# Patient Record
Sex: Male | Born: 1945 | Race: White | Hispanic: No | Marital: Married
Health system: Southern US, Community
[De-identification: ages and names within clinical notes are randomized; demographics above are authoritative.]

## PROBLEM LIST (undated history)

## (undated) DIAGNOSIS — R569 Unspecified convulsions: Secondary | ICD-10-CM

## (undated) DIAGNOSIS — I10 Essential (primary) hypertension: Secondary | ICD-10-CM

## (undated) DIAGNOSIS — I82409 Acute embolism and thrombosis of unspecified deep veins of unspecified lower extremity: Secondary | ICD-10-CM

## (undated) DIAGNOSIS — K219 Gastro-esophageal reflux disease without esophagitis: Secondary | ICD-10-CM

## (undated) DIAGNOSIS — F4310 Post-traumatic stress disorder, unspecified: Secondary | ICD-10-CM

## (undated) DIAGNOSIS — E785 Hyperlipidemia, unspecified: Secondary | ICD-10-CM

## (undated) DIAGNOSIS — N289 Disorder of kidney and ureter, unspecified: Secondary | ICD-10-CM

## (undated) HISTORY — PX: JOINT REPLACEMENT: SHX530

---
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
## 2012-05-01 ENCOUNTER — Telehealth: Payer: Self-pay

## 2012-05-01 NOTE — Telephone Encounter (Signed)
LMOM to call.

## 2012-05-10 ENCOUNTER — Other Ambulatory Visit (HOSPITAL_COMMUNITY): Payer: Self-pay | Admitting: Urology

## 2012-05-10 DIAGNOSIS — R3989 Other symptoms and signs involving the genitourinary system: Secondary | ICD-10-CM

## 2012-05-15 NOTE — Telephone Encounter (Signed)
VM from pt. He does not wish to schedule colonoscopy yet. He is waiting on results of biopsy from his prostate and should have that by 11/6. He will call after that.

## 2012-05-18 ENCOUNTER — Ambulatory Visit (HOSPITAL_COMMUNITY)
Admission: RE | Admit: 2012-05-18 | Discharge: 2012-05-18 | Disposition: A | Discharge status: Home / Self Care | Payer: Medicare Other | Source: Ambulatory Visit | Attending: Urology | Admitting: Urology

## 2012-05-18 DIAGNOSIS — R9389 Abnormal findings on diagnostic imaging of other specified body structures: Secondary | ICD-10-CM | POA: Insufficient documentation

## 2012-05-18 DIAGNOSIS — R3989 Other symptoms and signs involving the genitourinary system: Secondary | ICD-10-CM

## 2012-06-06 NOTE — Telephone Encounter (Signed)
Letter reminder to pt and letter to PCP.

## 2013-03-21 ENCOUNTER — Emergency Department (HOSPITAL_COMMUNITY): Payer: Medicare Other

## 2013-03-21 ENCOUNTER — Encounter (HOSPITAL_COMMUNITY): Payer: Self-pay

## 2013-03-21 ENCOUNTER — Inpatient Hospital Stay (HOSPITAL_COMMUNITY)
Admission: EM | Admit: 2013-03-21 | Discharge: 2013-03-24 | DRG: 872 | Disposition: A | Discharge status: Home / Self Care | Payer: Medicare Other | Attending: Internal Medicine | Admitting: Internal Medicine

## 2013-03-21 DIAGNOSIS — D696 Thrombocytopenia, unspecified: Secondary | ICD-10-CM

## 2013-03-21 DIAGNOSIS — A938 Other specified arthropod-borne viral fevers: Secondary | ICD-10-CM

## 2013-03-21 DIAGNOSIS — E785 Hyperlipidemia, unspecified: Secondary | ICD-10-CM | POA: Diagnosis present

## 2013-03-21 DIAGNOSIS — R630 Anorexia: Secondary | ICD-10-CM | POA: Diagnosis present

## 2013-03-21 DIAGNOSIS — R000 Tachycardia, unspecified: Secondary | ICD-10-CM | POA: Diagnosis present

## 2013-03-21 DIAGNOSIS — E872 Acidosis, unspecified: Secondary | ICD-10-CM

## 2013-03-21 DIAGNOSIS — E871 Hypo-osmolality and hyponatremia: Secondary | ICD-10-CM

## 2013-03-21 DIAGNOSIS — A770 Spotted fever due to Rickettsia rickettsii: Secondary | ICD-10-CM

## 2013-03-21 DIAGNOSIS — A419 Sepsis, unspecified organism: Principal | ICD-10-CM

## 2013-03-21 DIAGNOSIS — F17200 Nicotine dependence, unspecified, uncomplicated: Secondary | ICD-10-CM | POA: Diagnosis present

## 2013-03-21 DIAGNOSIS — R569 Unspecified convulsions: Secondary | ICD-10-CM | POA: Diagnosis present

## 2013-03-21 DIAGNOSIS — D6959 Other secondary thrombocytopenia: Secondary | ICD-10-CM | POA: Diagnosis present

## 2013-03-21 DIAGNOSIS — E876 Hypokalemia: Secondary | ICD-10-CM | POA: Diagnosis not present

## 2013-03-21 DIAGNOSIS — N390 Urinary tract infection, site not specified: Secondary | ICD-10-CM

## 2013-03-21 DIAGNOSIS — K219 Gastro-esophageal reflux disease without esophagitis: Secondary | ICD-10-CM | POA: Diagnosis present

## 2013-03-21 DIAGNOSIS — G40909 Epilepsy, unspecified, not intractable, without status epilepticus: Secondary | ICD-10-CM | POA: Diagnosis present

## 2013-03-21 DIAGNOSIS — F4310 Post-traumatic stress disorder, unspecified: Secondary | ICD-10-CM | POA: Diagnosis present

## 2013-03-21 DIAGNOSIS — I10 Essential (primary) hypertension: Secondary | ICD-10-CM | POA: Diagnosis present

## 2013-03-21 HISTORY — DX: Post-traumatic stress disorder, unspecified: F43.10

## 2013-03-21 HISTORY — DX: Unspecified convulsions: R56.9

## 2013-03-21 HISTORY — DX: Essential (primary) hypertension: I10

## 2013-03-21 HISTORY — DX: Gastro-esophageal reflux disease without esophagitis: K21.9

## 2013-03-21 HISTORY — DX: Hyperlipidemia, unspecified: E78.5

## 2013-03-21 LAB — CBC WITH DIFFERENTIAL/PLATELET
Basophils Absolute: 0 10*3/uL (ref 0.0–0.1)
Basophils Relative: 0 % (ref 0–1)
Eosinophils Relative: 0 % (ref 0–5)
HCT: 39.2 % (ref 39.0–52.0)
Hemoglobin: 14.3 g/dL (ref 13.0–17.0)
Lymphocytes Relative: 15 % (ref 12–46)
Lymphs Abs: 0.6 10*3/uL — ABNORMAL LOW (ref 0.7–4.0)
MCV: 87.7 fL (ref 78.0–100.0)
Monocytes Relative: 5 % (ref 3–12)
Neutro Abs: 3.1 10*3/uL (ref 1.7–7.7)
RBC: 4.47 MIL/uL (ref 4.22–5.81)
WBC: 3.9 10*3/uL — ABNORMAL LOW (ref 4.0–10.5)

## 2013-03-21 LAB — LACTIC ACID, PLASMA
Lactic Acid, Venous: 3.2 mmol/L — ABNORMAL HIGH (ref 0.5–2.2)
Lactic Acid, Venous: 1.7 mmol/L (ref 0.5–2.2)

## 2013-03-21 LAB — URINALYSIS, ROUTINE W REFLEX MICROSCOPIC
Glucose, UA: NEGATIVE mg/dL
Leukocytes, UA: NEGATIVE
Specific Gravity, Urine: 1.02 (ref 1.005–1.030)
Urobilinogen, UA: 1 mg/dL (ref 0.0–1.0)

## 2013-03-21 LAB — APTT: aPTT: 33 seconds (ref 24–37)

## 2013-03-21 LAB — COMPREHENSIVE METABOLIC PANEL
ALT: 51 U/L (ref 0–53)
AST: 99 U/L — ABNORMAL HIGH (ref 0–37)
Alkaline Phosphatase: 79 U/L (ref 39–117)
CO2: 25 mEq/L (ref 19–32)
Calcium: 8.5 mg/dL (ref 8.4–10.5)
Glucose, Bld: 201 mg/dL — ABNORMAL HIGH (ref 70–99)
Potassium: 3.5 mEq/L (ref 3.5–5.1)
Sodium: 127 mEq/L — ABNORMAL LOW (ref 135–145)
Total Protein: 7.7 g/dL (ref 6.0–8.3)

## 2013-03-21 LAB — URINE MICROSCOPIC-ADD ON

## 2013-03-21 MED ORDER — TAMSULOSIN HCL 0.4 MG PO CAPS
0.4000 mg | ORAL_CAPSULE | Freq: Every day | ORAL | Status: DC
  Administered 2013-03-21 – 2013-03-23 (×3): 0.4 mg via ORAL
  Filled 2013-03-21 (×3): qty 1

## 2013-03-21 MED ORDER — DIAZEPAM 5 MG PO TABS: 5.0000 mg | ORAL_TABLET | Freq: Four times a day (QID) | ORAL | Status: DC | PRN

## 2013-03-21 MED ORDER — ONDANSETRON HCL 4 MG/2ML IJ SOLN: 4.0000 mg | Freq: Four times a day (QID) | INTRAMUSCULAR | Status: DC | PRN

## 2013-03-21 MED ORDER — SODIUM CHLORIDE 0.9 % IJ SOLN
3.0000 mL | Freq: Two times a day (BID) | INTRAMUSCULAR | Status: DC
  Administered 2013-03-22 – 2013-03-23 (×2): 3 mL via INTRAVENOUS

## 2013-03-21 MED ORDER — ZOLPIDEM TARTRATE 5 MG PO TABS
5.0000 mg | ORAL_TABLET | Freq: Every day | ORAL | Status: DC
  Administered 2013-03-21 – 2013-03-23 (×3): 5 mg via ORAL
  Filled 2013-03-21 (×3): qty 1

## 2013-03-21 MED ORDER — MELOXICAM 7.5 MG PO TABS
15.0000 mg | ORAL_TABLET | Freq: Every day | ORAL | Status: DC
  Administered 2013-03-22 – 2013-03-23 (×2): 15 mg via ORAL
  Filled 2013-03-21: qty 1
  Filled 2013-03-21: qty 2
  Filled 2013-03-21 (×2): qty 1
  Filled 2013-03-21 (×2): qty 2

## 2013-03-21 MED ORDER — SODIUM CHLORIDE 0.9 % IV BOLUS (SEPSIS)
2000.0000 mL | Freq: Once | INTRAVENOUS | Status: AC
  Administered 2013-03-21: 2000 mL via INTRAVENOUS

## 2013-03-21 MED ORDER — SENNA 8.6 MG PO TABS
1.0000 | ORAL_TABLET | Freq: Two times a day (BID) | ORAL | Status: DC
  Administered 2013-03-21 – 2013-03-23 (×5): 8.6 mg via ORAL
  Filled 2013-03-21 (×5): qty 1

## 2013-03-21 MED ORDER — LORATADINE 10 MG PO TABS
10.0000 mg | ORAL_TABLET | Freq: Every day | ORAL | Status: DC
  Administered 2013-03-21 – 2013-03-23 (×3): 10 mg via ORAL
  Filled 2013-03-21 (×3): qty 1

## 2013-03-21 MED ORDER — PHENYTOIN SODIUM EXTENDED 100 MG PO CAPS
100.0000 mg | ORAL_CAPSULE | Freq: Three times a day (TID) | ORAL | Status: DC
  Administered 2013-03-21 – 2013-03-22 (×4): 100 mg via ORAL
  Filled 2013-03-21 (×4): qty 1

## 2013-03-21 MED ORDER — ACETAMINOPHEN 325 MG PO TABS
650.0000 mg | ORAL_TABLET | Freq: Once | ORAL | Status: AC
  Administered 2013-03-21: 650 mg via ORAL
  Filled 2013-03-21: qty 2

## 2013-03-21 MED ORDER — DOXYCYCLINE HYCLATE 100 MG IV SOLR
100.0000 mg | Freq: Two times a day (BID) | INTRAVENOUS | Status: DC
  Administered 2013-03-21 – 2013-03-23 (×4): 100 mg via INTRAVENOUS
  Filled 2013-03-21 (×5): qty 100

## 2013-03-21 MED ORDER — ACETAMINOPHEN 650 MG RE SUPP: 650.0000 mg | Freq: Four times a day (QID) | RECTAL | Status: DC | PRN

## 2013-03-21 MED ORDER — FLEET ENEMA 7-19 GM/118ML RE ENEM: 1.0000 | ENEMA | Freq: Once | RECTAL | Status: AC | PRN

## 2013-03-21 MED ORDER — MELOXICAM 7.5 MG PO TABS
ORAL_TABLET | ORAL | Status: AC
  Filled 2013-03-21: qty 1

## 2013-03-21 MED ORDER — HYDROMORPHONE HCL PF 1 MG/ML IJ SOLN: 0.5000 mg | INTRAMUSCULAR | Status: DC | PRN

## 2013-03-21 MED ORDER — SIMVASTATIN 20 MG PO TABS
40.0000 mg | ORAL_TABLET | Freq: Every evening | ORAL | Status: DC
  Administered 2013-03-21 – 2013-03-23 (×3): 40 mg via ORAL
  Filled 2013-03-21 (×3): qty 2

## 2013-03-21 MED ORDER — SODIUM CHLORIDE 0.9 % IV SOLN
INTRAVENOUS | Status: DC
  Administered 2013-03-21 – 2013-03-22 (×2): via INTRAVENOUS
  Administered 2013-03-23: 10 mL via INTRAVENOUS

## 2013-03-21 MED ORDER — DEXTROSE 5 % IV SOLN
1.0000 g | INTRAVENOUS | Status: DC
  Administered 2013-03-21 – 2013-03-23 (×3): 1 g via INTRAVENOUS
  Filled 2013-03-21 (×5): qty 10

## 2013-03-21 MED ORDER — ALUM & MAG HYDROXIDE-SIMETH 200-200-20 MG/5ML PO SUSP: 30.0000 mL | Freq: Four times a day (QID) | ORAL | Status: DC | PRN

## 2013-03-21 MED ORDER — HYDROCODONE-ACETAMINOPHEN 5-325 MG PO TABS
1.0000 | ORAL_TABLET | ORAL | Status: DC | PRN
  Administered 2013-03-22: 2 via ORAL
  Filled 2013-03-21: qty 2

## 2013-03-21 MED ORDER — ARIPIPRAZOLE 10 MG PO TABS
20.0000 mg | ORAL_TABLET | Freq: Every day | ORAL | Status: DC
  Administered 2013-03-21 – 2013-03-23 (×3): 20 mg via ORAL
  Filled 2013-03-21 (×3): qty 2

## 2013-03-21 MED ORDER — PANTOPRAZOLE SODIUM 40 MG PO TBEC
40.0000 mg | DELAYED_RELEASE_TABLET | Freq: Every day | ORAL | Status: DC
  Administered 2013-03-21 – 2013-03-23 (×3): 40 mg via ORAL
  Filled 2013-03-21 (×3): qty 1

## 2013-03-21 MED ORDER — VENLAFAXINE HCL ER 75 MG PO CP24
150.0000 mg | ORAL_CAPSULE | Freq: Every day | ORAL | Status: DC
  Administered 2013-03-21 – 2013-03-23 (×3): 150 mg via ORAL
  Filled 2013-03-21 (×3): qty 2

## 2013-03-21 MED ORDER — ONDANSETRON HCL 4 MG PO TABS: 4.0000 mg | ORAL_TABLET | Freq: Four times a day (QID) | ORAL | Status: DC | PRN

## 2013-03-21 MED ORDER — ACETAMINOPHEN 325 MG PO TABS
650.0000 mg | ORAL_TABLET | Freq: Four times a day (QID) | ORAL | Status: DC | PRN
  Administered 2013-03-21 – 2013-03-23 (×4): 650 mg via ORAL
  Filled 2013-03-21 (×4): qty 2

## 2013-03-21 NOTE — ED Notes (Signed)
Pt c/o NVD, generalized weakness, lightheadedness, and dizziness since Sunday. Pt states he fell twice today due to weakness. Pt hit his forehead on countertop when falling this morning. Pt denies LOC. Pt also denies headache, chest pain, abdominal pain. Pt states he has not eaten since Sunday due to symptoms.

## 2013-03-21 NOTE — ED Provider Notes (Signed)
CSN: [id number]     Arrival date & time 03/21/13  1251 History  This chart was scribed for Nathan B. Bernette Mayers, MD by Blanchard Kelch, ED Scribe. The patient was seen in room APA09/APA09. Patient's care was started at 1:27 PM.   Chief Complaint  Patient presents with  . Emesis  . Diarrhea  . Fever    The history is provided by the patient. No language interpreter was used.    HPI Comments: Nathan [name] is a 67 y.o. male who presents to the Emergency Department complaining of ongoing, constant, gradually worsening fever (max 103, currently) with associated loss of appetite, chills, emesis, diarrhea, urinary frequency and weakness since three days ago. The emesis stopped yesterday. Patient went to PCP who told him to come to the emergency department. Patient reported falling down twice today and hit his head but denies syncope  Patient has dogs and has been out in the yard recently, but denies any tick bites. He also traveled to [place] two months ago and returned the 14th of June. Patient reports he was in a Malaria free zone and not near the area with a current Ebola outbreak. Pt denies any sick contacts. Patient has bilateral leg sores that he scratches that have been present for about a year. Patient denies coughing, sore throat, runny nose, hematochezia, dysuria, abdominal pain, or rashes.   Past Medical History  Diagnosis Date  . PTSD (post-traumatic stress disorder)   . Seizure    History reviewed. No pertinent past surgical history. No family history on file. History  Substance Use Topics  . Smoking status: Current Some Day Smoker    Types: Pipe  . Smokeless tobacco: Not on file  . Alcohol Use: No    Review of Systems: A complete 10 system review of systems was obtained and all systems are negative except as noted in the HPI and PMH.    Allergies  Review of patient's allergies indicates no known allergies.  Home Medications  No current outpatient prescriptions on  file.  Triage Vitals: BP 113/68  Pulse 118  Temp(Src) 103 F (39.4 C) (Oral)  Resp 20  Ht 6' (1.829 m)  Wt 202 lb (91.627 kg)  BMI 27.39 kg/m2  SpO2 99%  Physical Exam  Nursing note and vitals reviewed. Constitutional: He is oriented to person, place, and time. He appears well-developed and well-nourished.  HENT:  Head: Normocephalic and atraumatic.  Dry mouth.   Eyes: EOM are normal. Pupils are equal, round, and reactive to light.  Neck: Normal range of motion. Neck supple.  Cardiovascular: Normal rate, normal heart sounds and intact distal pulses.   Pulmonary/Chest: Effort normal and breath sounds normal.  Abdominal: Bowel sounds are normal. He exhibits no distension. There is no tenderness.  Musculoskeletal: Normal range of motion. He exhibits no edema and no tenderness.  Neurological: He is alert and oriented to person, place, and time. He has normal strength. No cranial nerve deficit or sensory deficit.  Skin: Skin is warm and dry. No rash noted.  Several excoriated areas on bilateral lower extremities with no signs or secondary infection. No ticks found on skin exam.  Psychiatric: He has a normal mood and affect.    ED Course  Procedures (including critical care time)  DIAGNOSTIC STUDIES: Oxygen Saturation is 99% on room air, normal by my interpretation.    COORDINATION OF CARE:  1:32 PM - Patient verbalizes understanding and agrees with treatment plan.    Labs Review Labs Reviewed  CBC WITH DIFFERENTIAL - Abnormal; Notable for the following:    WBC 3.9 (*)    MCHC 36.5 (*)    Platelets 100 (*)    Neutrophils Relative % 80 (*)    Lymphs Abs 0.6 (*)    All other components within normal limits  URINALYSIS, ROUTINE W REFLEX MICROSCOPIC - Abnormal; Notable for the following:    Hgb urine dipstick TRACE (*)    Ketones, ur TRACE (*)    Protein, ur TRACE (*)    All other components within normal limits  COMPREHENSIVE METABOLIC PANEL - Abnormal; Notable for the  following:    Sodium 127 (*)    Chloride 89 (*)    Glucose, Bld 201 (*)    AST 99 (*)    GFR calc non Af Amer 55 (*)    GFR calc Af Amer 64 (*)    All other components within normal limits  PROTIME-INR - Abnormal; Notable for the following:    Prothrombin Time 15.5 (*)    All other components within normal limits  LACTIC ACID, PLASMA - Abnormal; Notable for the following:    Lactic Acid, Venous 3.2 (*)    All other components within normal limits  GLUCOSE, CAPILLARY - Abnormal; Notable for the following:    Glucose-Capillary 155 (*)    All other components within normal limits  URINE MICROSCOPIC-ADD ON - Abnormal; Notable for the following:    Bacteria, UA MANY (*)    Casts GRANULAR CAST (*)    All other components within normal limits  CULTURE, BLOOD (ROUTINE X 2)  CULTURE, BLOOD (ROUTINE X 2)  URINE CULTURE  APTT  ROCKY MTN SPOTTED FVR AB, IGG-BLOOD  ROCKY MTN SPOTTED FVR AB, IGM-BLOOD  HIV ANTIBODY (ROUTINE TESTING)  EHRLICHIA ANTIBODY PANEL  LACTIC ACID, PLASMA  BASIC METABOLIC PANEL  APTT  PROTIME-INR  CBC    Imaging Review  Dg Chest 2 View  03/21/2013   *RADIOLOGY REPORT*  Clinical Data: Fever, lightheaded.  CHEST - 2 VIEW  Comparison: None.  Findings: Trachea is midline.  Heart size normal.  Lungs are hyperinflated but clear.  No pleural fluid.  Mild pectus deformity.  Degenerative changes in the spine.  Mild anterior wedging of a lower thoracic vertebral body, age indeterminate.  IMPRESSION: No acute findings.   Original Report Authenticated By: Leanna Battles, M.D.   Ct Head Wo Contrast  03/21/2013   CLINICAL DATA:  Head injury following a fall.  EXAM: CT HEAD WITHOUT CONTRAST  TECHNIQUE: Contiguous axial images were obtained from the base of the skull through the vertex without intravenous contrast.  COMPARISON:  None.  FINDINGS: Mildly enlarged ventricles and subarachnoid spaces. Small amount of patchy white matter low density in both cerebral hemispheres and  small area of more focal white matter low density in the right frontal lobe adjacent to the frontal horn of the right lateral ventricle. No skull fracture, intracranial hemorrhage or paranasal sinus air-fluid levels.  IMPRESSION: 1. No skull fracture or intracranial hemorrhage. 2. Mild atrophy. 3. Minimal small vessel white matter ischemic changes.   Electronically Signed   By: Gordan Payment   On: 03/21/2013 14:27    MDM   1. 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 Seizure   3. PTSD (post-traumatic stress disorder)   4. HTN (hypertension)   5. GERD (gastroesophageal reflux disease)   6. Acidosis   7. Hyponatremia   8. Sepsis   9. Thrombocytopenia   10. UTI (lower urinary tract  infection)   11. Tick-borne fever       Date: 03/21/2013  Rate: 100  Rhythm: sinus tachycardia  QRS Axis: normal  Intervals: normal  ST/T Wave abnormalities: normal  Conduction Disutrbances:none  Narrative Interpretation:   Old EKG Reviewed: none available  Pt with fever and headache for 3 days, also has hyponatremia, thrombocytopenia. Concern for occult RMSF. Will begin doxycycline and admit for further evaluation, hydration and titers.   I personally performed the services described in this documentation, which was scribed in my presence. The recorded information has been reviewed and is accurate.     Nathan B. Bernette Mayers, MD 03/21/13 2016

## 2013-03-21 NOTE — H&P (Signed)
[facility] History and Physical  ALOYSUIS RIBAUDO EAV:[id number] DOB: [birth year] DOA: 03/21/2013  Referring physician:  PCP: Provider Not In System  Specialists:   Chief Complaint: persistent fever, weakness anorexia, nausea  HPI: Nathan [name] is a 67 y.o. male with past medical history that includes hypertension, hyperlipidemia, seizure disorder, posttraumatic stress disorder presents emergency room with chief complaint of persistent fever, weakness, anorexia nausea and vomiting. Information is obtained from the patient. He indicates that 4 days ago he developed mild weakness with intermittent chills and fever. He also indicates he was extremely fatigued and slept most of the previous 4 days. Yesterday he developed nausea with vomiting. He describes the emesis as yellowish bile. He denies any coffee ground emesis. He also reports that yesterday he fell without losing consciousness. He explains that his legs just gave out. He indicates that he did hit his head but did not lose consciousness. Associated symptoms include fever with a max of 103, anorexia, intermittent incontinence of urine and bowel. Patient denies abdominal pain dysuria hematuria diarrhea constipation or melena. He denies headache or visual disturbances. He denies chest pain palpitations or shortness of breath. He does indicate that [place] and spends a lot of time in the woods. He has several excoriated areas on his lower extremities bilaterally that he explains as previous tick bites that he continues to scratch. There is also several areas that appear to be similar in nature but completely healed. Lab work in the emergency room is significant for white count of 3.9 platelets of 100 sodium of 127 chloride of 89 and lactic acid of 3.2 and AST of 99, PT of 15.5. His vital signs are significant for an oral temperature of 103 a heart rate of 118 blood pressure 113/68. Urinalysis with  many bacteria 3-6 WBCs. CT of the head with no acute findings. Chest x-ray unremarkable. EKG with normal sinus rhythm and PACs. Symptoms came on suddenly have persisted and worsened characterized as moderate to severe.  Review of Systems:  14 point review of systems complete and negative except as indicated in history of present illness  Past Medical History  Diagnosis Date  . PTSD (post-traumatic stress disorder)   . Seizure   . Hyperlipidemia   . HTN (hypertension)   . GERD (gastroesophageal reflux disease)    History reviewed. No pertinent past surgical history. Social History:  reports that he has been smoking Pipe.  He does not have any smokeless tobacco history on file. He reports that he does not drink alcohol or use illicit drugs. 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 He is independent with his ADLs. He is a veteran of the [employer] his primary care provider is in the [place] No family history on file. family history reviewed in his not pertinent to the admission of this elderly gentleman.  Prior to Admission medications   Medication Sig Start Date End Date Taking? Authorizing Provider  ARIPiprazole (ABILIFY) 20 MG tablet Take 20 mg by mouth daily.   Yes Historical Provider, MD  cetirizine (ZYRTEC) 10 MG tablet Take 10 mg by mouth daily.   Yes Historical Provider, MD  diazepam (VALIUM) 5 MG tablet Take 5 mg by mouth every 6 (six) hours as needed for anxiety.   Yes Historical Provider, MD  lisinopril (PRINIVIL,ZESTRIL) 40 MG tablet Take 40 mg by mouth daily.   Yes  Historical Provider, MD  meloxicam (MOBIC) 15 MG tablet Take 15 mg by mouth daily.   Yes Historical Provider, MD  metoprolol tartrate (LOPRESSOR) 25 MG tablet Take 25 mg by mouth 2 (two) times daily.   Yes Historical Provider, MD  omeprazole (PRILOSEC) 20 MG capsule Take 20 mg by mouth daily.   Yes Historical Provider, MD  phenytoin  (DILANTIN) 100 MG ER capsule Take by mouth 3 (three) times daily.   Yes Historical Provider, MD  sildenafil (VIAGRA) 50 MG tablet Take 50 mg by mouth daily as needed for erectile dysfunction.   Yes Historical Provider, MD  simvastatin (ZOCOR) 40 MG tablet Take 40 mg by mouth every evening.   Yes Historical Provider, MD  tamsulosin (FLOMAX) 0.4 MG CAPS capsule Take 0.4 mg by mouth daily.   Yes Historical Provider, MD  venlafaxine XR (EFFEXOR-XR) 150 MG 24 hr capsule Take 150 mg by mouth daily.   Yes Historical Provider, MD  zolpidem (AMBIEN) 10 MG tablet Take 10 mg by mouth at bedtime.   Yes Historical Provider, MD   Physical Exam: Filed Vitals:   03/21/13 1424  BP: 104/65  Pulse: 101  Temp:   Resp:      General:  Well-nourished no acute distress  Eyes: PE RRL, EOMI, no scleral icterus  ENT: Ears clear nose without drainage oropharynx without erythema or exudate mucous membranes of his mouth are pink slightly dry  Neck: Supple full range of motion nontender to palpation no lymphadenopathy no meningeal signs appreciated  Cardiovascular: Tachycardic but regular no murmur no gallop or no rub no lower extremity the  Respiratory: Normal effort breath sounds somewhat distant slightly coarse. No wheeze no rhonchi  Abdomen: Nondistended soft positive bowel sounds nontender to  Skin: Warm and dry lower extremities with several excoriated areas bilaterally. Also several similar areas that have completely healed. None with any erythema swelling drainage or indication of infection.  Musculoskeletal: No clubbing no cyanosis moves all extremities  Psychiatric: Cooperative calm  Neurologic: Cranial nerves II through XII grossly intact speech clear facial symmetry  Labs on Admission:  Basic Metabolic Panel:  Recent Labs Lab 03/21/13 1330  NA 127*  K 3.5  CL 89*  CO2 25  GLUCOSE 201*  BUN 20  CREATININE 1.30  CALCIUM 8.5   Liver Function Tests:  Recent Labs Lab 03/21/13 1330   AST 99*  ALT 51  ALKPHOS 79  BILITOT 0.5  PROT 7.7  ALBUMIN 3.7   No results found for this basename: LIPASE, AMYLASE,  in the last 168 hours No results found for this basename: AMMONIA,  in the last 168 hours CBC:  Recent Labs Lab 03/21/13 1330  WBC 3.9*  NEUTROABS 3.1  HGB 14.3  HCT 39.2  MCV 87.7  PLT 100*   Cardiac Enzymes: No results found for this basename: CKTOTAL, CKMB, CKMBINDEX, TROPONINI,  in the last 168 hours  BNP (last 3 results) No results found for this basename: PROBNP,  in the last 8760 hours CBG:  Recent Labs Lab 03/21/13 1349  GLUCAP 155*    Radiological Exams on Admission: Dg Chest 2 View  03/21/2013   *RADIOLOGY REPORT*  Clinical Data: Fever, lightheaded.  CHEST - 2 VIEW  Comparison: None.  Findings: Trachea is midline.  Heart size normal.  Lungs are hyperinflated but clear.  No pleural fluid.  Mild pectus deformity.  Degenerative changes in the spine.  Mild anterior wedging of a lower thoracic vertebral body, age indeterminate.  IMPRESSION: No acute  findings.   Original Report Authenticated By: Leanna Battles, M.D.   Ct Head Wo Contrast  03/21/2013   CLINICAL DATA:  Head injury following a fall.  EXAM: CT HEAD WITHOUT CONTRAST  TECHNIQUE: Contiguous axial images were obtained from the base of the skull through the vertex without intravenous contrast.  COMPARISON:  None.  FINDINGS: Mildly enlarged ventricles and subarachnoid spaces. Small amount of patchy white matter low density in both cerebral hemispheres and small area of more focal white matter low density in the right frontal lobe adjacent to the frontal horn of the right lateral ventricle. No skull fracture, intracranial hemorrhage or paranasal sinus air-fluid levels.  IMPRESSION: 1. No skull fracture or intracranial hemorrhage. 2. Mild atrophy. 3. Minimal small vessel white matter ischemic changes.   Electronically Signed   By: Gordan Payment   On: 03/21/2013 14:27    EKG: Independently reviewed.  Sinus rhythm with PACs  Assessment/Plan  Sepsis: May have [facility] spotted fever from tick bite vs other tick borne illness. Will admit to telemetry. Blood cultures are pending. Will provide IV fluids and will continue doxycycline that was started in the emergency department. Patient is currently hemodynamically stable. Will hold his antihypertensives for now given that his blood pressure range is 104-110. Monitor closely  Thrombocytopenia: Likely related to presumed [facility] spotted fever. Check RMSF labs also check ehrlichiosis. See treatment for #1. No obvious signs of bleeding. Will use SCDs for DVT prophylaxis. Repeat PT in a.m.  Lactic Acidosis: Due to #1. Continue vigorous IV fluids. Will recheck in the a.m.  Tachycardia: Mild. Likely related to #1. Patient is on a beta blocker and an ACE inhibitor for a history of hypertension that is on hold right now. SPECT was will resolve with adequate IV hydration. Will monitor  Hyponatremia: I clearly related to nausea vomiting decreased by mouth intake in the setting of [facility] spotted fever. Provide IV fluids. Will recheck in the a.m.  Seizure: Patient with history of same. States he's not had a seizure and 9 years. Will continue his home medication.  HTN (hypertension): Slightly soft on admission. Will hold his lisinopril and his beta blocker. Will monitor  GERD (gastroesophageal reflux disease): Continue home  UTI: urinalysis and clinical hx. Consistent. Ceftriaxone.     Code Status: full Family Communication: None present Disposition Plan: home when medically ready  Time spent: 70 minutes  Gwenyth Bender [facility] Pager [phone]  If 7PM-7AM, please contact night-coverage [email or web address] Password [facility] 03/21/2013, 4:33 PM  I have seen and examined this patient and I agree with the above assessment and plan.  Patient with febrile illness, possible UTI, multiple tick bites. Has traveled to [place] for 2 weeks  and came back more than 2 months ago in mid June. No sick contacts since.   Pamella Pert, MD [facility] [phone]

## 2013-03-21 NOTE — ED Notes (Signed)
Pt reports not eating since Sunday, uncontrollable urination and diarrhea.  Also reports vomiting.  Pt febrile in triage.  Denies any pain but generalized weakness.  Says fell twice before coming to hospital today.

## 2013-03-22 DIAGNOSIS — R000 Tachycardia, unspecified: Secondary | ICD-10-CM

## 2013-03-22 DIAGNOSIS — N390 Urinary tract infection, site not specified: Secondary | ICD-10-CM

## 2013-03-22 DIAGNOSIS — D696 Thrombocytopenia, unspecified: Secondary | ICD-10-CM

## 2013-03-22 DIAGNOSIS — A419 Sepsis, unspecified organism: Secondary | ICD-10-CM

## 2013-03-22 DIAGNOSIS — E876 Hypokalemia: Secondary | ICD-10-CM | POA: Diagnosis not present

## 2013-03-22 LAB — CBC
HCT: 32.5 % — ABNORMAL LOW (ref 39.0–52.0)
Platelets: 74 10*3/uL — ABNORMAL LOW (ref 150–400)
RDW: 12.4 % (ref 11.5–15.5)
WBC: 3 10*3/uL — ABNORMAL LOW (ref 4.0–10.5)

## 2013-03-22 LAB — ROCKY MTN SPOTTED FVR AB, IGG-BLOOD: RMSF IgG: 0.38 IV

## 2013-03-22 LAB — URINE CULTURE

## 2013-03-22 LAB — BASIC METABOLIC PANEL
BUN: 11 mg/dL (ref 6–23)
CO2: 21 mEq/L (ref 19–32)
Chloride: 102 mEq/L (ref 96–112)
GFR calc Af Amer: >90 mL/min (ref >90)
Potassium: 3.4 mEq/L — ABNORMAL LOW (ref 3.5–5.1)

## 2013-03-22 LAB — PROTIME-INR: INR: 1.47 (ref 0.00–1.49)

## 2013-03-22 LAB — ROCKY MTN SPOTTED FVR AB, IGM-BLOOD: RMSF IgM: 0.17 IV (ref 0.00–0.89)

## 2013-03-22 LAB — HIV ANTIBODY (ROUTINE TESTING W REFLEX): HIV: NONREACTIVE

## 2013-03-22 MED ORDER — POTASSIUM CHLORIDE CRYS ER 20 MEQ PO TBCR
40.0000 meq | EXTENDED_RELEASE_TABLET | Freq: Once | ORAL | Status: AC
  Administered 2013-03-22: 40 meq via ORAL
  Filled 2013-03-22: qty 2

## 2013-03-22 MED ORDER — METOPROLOL TARTRATE 25 MG PO TABS
25.0000 mg | ORAL_TABLET | Freq: Two times a day (BID) | ORAL | Status: DC
  Administered 2013-03-22 – 2013-03-23 (×4): 25 mg via ORAL
  Filled 2013-03-22 (×4): qty 1

## 2013-03-22 NOTE — Care Management Note (Signed)
    Page 1 of 1   03/23/2013     2:40:58 PM   CARE MANAGEMENT NOTE 03/23/2013  Patient:  Nathan [name], Nathan [name]   Account Number:  [id number]  Date Initiated:  03/22/2013  Documentation initiated by:  Sharrie Rothman  Subjective/Objective Assessment:   Pt admitted from home with sepsis and UTI. Pt lives with his wife and will return home at discharge. Pt is independent with ADL's. Pt is [facility] pt.     Action/Plan:   [facility] called and message left for Penelope Galas, transfer coordinator about pt inpatient. Awaiting return call. Pt would prefer to stay here at this time.   Anticipated DC Date:  03/26/2013   Anticipated DC Plan:  HOME/SELF CARE      DC Planning Services  CM consult      Choice offered to / List presented to:             Status of service:  Completed, signed off Medicare Important Message given?  YES (If response is "NO", the following Medicare IM given date fields will be blank) Date Medicare IM given:  03/30/2013 Date Additional Medicare IM given:    Discharge Disposition:  HOME/SELF CARE  Per UR Regulation:    If discussed at Long Length of Stay Meetings, dates discussed:    Comments:  03/23/13 1300 Arlyss Queen, RN BSN CM Another message left for Penelope Galas, transfer coordinator of [place]. Will await return call.  03/22/13 1315 Arlyss Queen, RN BSN CM

## 2013-03-22 NOTE — Progress Notes (Signed)
[facility] PROGRESS NOTE  Nathan [name] Pen MVH:[id number] DOB: 1945/08/23 DOA: 03/21/2013 PCP: Provider Not In System  Assessment/Plan:  Sepsis: May have [place] spotted fever from tick bite vs other tick borne illness. Blood cultures are pending. Max temp 102.2 this am. Blood cultures repeated. Continue IV fluids and will continue doxycycline day #2. Patient remains hemodynamically stable.   Thrombocytopenia: Likely related to presumed [facility] spotted fever. RMSF labs and ehrlichiosis pending. See treatment for #1. No obvious signs of bleeding. Will use SCDs for DVT prophylaxis. PT trending up this a.m.   Lactic Acidosis: Due to #1. Improved. Repeat lactic acid 1.7. Continue IV support   Tachycardia: Resolved. Likely related to #1. HR range 95-97. Will resume home BB with parameters.   Hyponatremia: Likely related to nausea vomiting decreased by mouth intake in the setting of [facility] spotted fever. Improving this am. continue IV fluids at slightly lower rate. Recheck in am.  Seizure: Patient with history of same. States he's not had a seizure and 9 years. Will continue his home medication.  HTN (hypertension): SBP range 115-158. DBP range 65-89. Will resume home BB with parameters. Continue to hold his lisinopril. Will monitor   GERD (gastroesophageal reflux disease): Stable Continue home   Hypokalemia: mild. Will replete and recheck.   UTI: urinalysis and clinical hx. Consistent. Ceftriaxone day #2. Cultures penidng   Code Status: full Family Communication: none present Disposition Plan: home when medically ready   Consultants:  none  Procedures:  none  Antibiotics: Doxycycline 03/21/18 >>>> Rocephin 03/21/13>>>  HPI/Subjective: Awake alert reports feeling "a little better" no complaints  Objective: Filed Vitals:   03/22/13 0951  BP: 131/77  Pulse: 73  Temp:   Resp:     Intake/Output Summary (Last 24 hours) at 03/22/13 0957 Last data filed at  03/22/13 0930  Gross per 24 hour  Intake 2013.33 ml  Output    725 ml  Net 1288.33 ml   Filed Weights   03/21/13 1301 03/21/13 1938  Weight: 91.627 kg (202 lb) 92.3 kg (203 lb 7.8 oz)   Exam:   General:  Well nourished NAD  Cardiovascular: RRR no gallup or rub NO LE edema  Respiratory: normal effort BS clear bilaterally to ausculation no wheeze   Abdomen: soft +BS non-tender to palpation  Musculoskeletal: no clubbing no cyanosis   Data Reviewed: Basic Metabolic Panel:  Recent Labs Lab 03/21/13 1330 03/22/13 0553  NA 127* 134*  K 3.5 3.4*  CL 89* 102  CO2 25 21  GLUCOSE 201* 101*  BUN 20 11  CREATININE 1.30 0.87  CALCIUM 8.5 7.9*   Liver Function Tests:  Recent Labs Lab 03/21/13 1330  AST 99*  ALT 51  ALKPHOS 79  BILITOT 0.5  PROT 7.7  ALBUMIN 3.7   CBC:  Recent Labs Lab 03/21/13 1330 03/22/13 0553  WBC 3.9* 3.0*  NEUTROABS 3.1  --   HGB 14.3 11.8*  HCT 39.2 32.5*  MCV 87.7 87.4  PLT 100* 74*   CBG:  Recent Labs Lab 03/21/13 1349  GLUCAP 155*   Studies: Dg Chest 2 View  03/21/2013   *RADIOLOGY REPORT*  Clinical Data: Fever, lightheaded.  CHEST - 2 VIEW  Comparison: None.  Findings: Trachea is midline.  Heart size normal.  Lungs are hyperinflated but clear.  No pleural fluid.  Mild pectus deformity.  Degenerative changes in the spine.  Mild anterior wedging of a lower thoracic vertebral body, age indeterminate.  IMPRESSION: No acute findings.  Original Report Authenticated By: Leanna Battles, M.D.   Ct Head Wo Contrast  03/21/2013   CLINICAL DATA:  Head injury following a fall.  EXAM: CT HEAD WITHOUT CONTRAST  TECHNIQUE: Contiguous axial images were obtained from the base of the skull through the vertex without intravenous contrast.  COMPARISON:  None.  FINDINGS: Mildly enlarged ventricles and subarachnoid spaces. Small amount of patchy white matter low density in both cerebral hemispheres and small area of more focal white matter low density  in the right frontal lobe adjacent to the frontal horn of the right lateral ventricle. No skull fracture, intracranial hemorrhage or paranasal sinus air-fluid levels.  IMPRESSION: 1. No skull fracture or intracranial hemorrhage. 2. Mild atrophy. 3. Minimal small vessel white matter ischemic changes.   Electronically Signed   By: Gordan Payment   On: 03/21/2013 14:27   Scheduled Meds: . ARIPiprazole  20 mg Oral Daily  . cefTRIAXone (ROCEPHIN)  IV  1 g Intravenous Q24H  . doxycycline (VIBRAMYCIN) IV  100 mg Intravenous Q12H  . loratadine  10 mg Oral Daily  . meloxicam  15 mg Oral Daily  . metoprolol tartrate  25 mg Oral BID  . pantoprazole  40 mg Oral Daily  . phenytoin  100 mg Oral TID  . senna  1 tablet Oral BID  . simvastatin  40 mg Oral QPM  . sodium chloride  3 mL Intravenous Q12H  . tamsulosin  0.4 mg Oral Daily  . venlafaxine XR  150 mg Oral Daily  . zolpidem  5 mg Oral QHS   Continuous Infusions: . sodium chloride 100 mL/hr at 03/21/13 2253   Principal Problem:   Sepsis Active Problems:   Seizure   HTN (hypertension)   GERD (gastroesophageal reflux disease)   Thrombocytopenia   Acidosis   Tachycardia   UTI (lower urinary tract infection)   Hyponatremia   Hypokalemia  Time spent: 30 minutes  [facility] M  [facility] Pager [phone]. If 7PM-7AM, please contact night-coverage at [email or web address], password [facility] 03/22/2013, 9:57 AM  LOS: 1 day

## 2013-03-22 NOTE — Progress Notes (Signed)
UR chart review completed.  

## 2013-03-23 LAB — HEPATIC FUNCTION PANEL
ALT: 77 U/L — ABNORMAL HIGH (ref 0–53)
Alkaline Phosphatase: 75 U/L (ref 39–117)
Bilirubin, Direct: 0.1 mg/dL (ref 0.0–0.3)
Indirect Bilirubin: 0.2 mg/dL — ABNORMAL LOW (ref 0.3–0.9)

## 2013-03-23 LAB — BASIC METABOLIC PANEL
BUN: 9 mg/dL (ref 6–23)
Chloride: 104 mEq/L (ref 96–112)
Glucose, Bld: 124 mg/dL — ABNORMAL HIGH (ref 70–99)
Potassium: 3.6 mEq/L (ref 3.5–5.1)

## 2013-03-23 LAB — CBC
HCT: 31.9 % — ABNORMAL LOW (ref 39.0–52.0)
Hemoglobin: 11.7 g/dL — ABNORMAL LOW (ref 13.0–17.0)
MCH: 32 pg (ref 26.0–34.0)
MCV: 87.2 fL (ref 78.0–100.0)
RBC: 3.66 MIL/uL — ABNORMAL LOW (ref 4.22–5.81)

## 2013-03-23 MED ORDER — DOXYCYCLINE HYCLATE 100 MG PO TABS
100.0000 mg | ORAL_TABLET | Freq: Two times a day (BID) | ORAL | Status: DC
  Administered 2013-03-23 (×2): 100 mg via ORAL
  Filled 2013-03-23 (×2): qty 1

## 2013-03-23 MED ORDER — DOXYCYCLINE HYCLATE 100 MG IV SOLR
INTRAVENOUS | Status: AC
  Filled 2013-03-23: qty 100

## 2013-03-23 MED ORDER — PHENYTOIN SODIUM EXTENDED 100 MG PO CAPS
200.0000 mg | ORAL_CAPSULE | Freq: Every day | ORAL | Status: DC
  Administered 2013-03-23: 200 mg via ORAL
  Filled 2013-03-23: qty 2

## 2013-03-23 MED ORDER — PHENYTOIN SODIUM EXTENDED 100 MG PO CAPS
300.0000 mg | ORAL_CAPSULE | Freq: Every day | ORAL | Status: DC
  Administered 2013-03-23: 300 mg via ORAL
  Filled 2013-03-23: qty 3

## 2013-03-23 NOTE — Progress Notes (Signed)
[facility] PROGRESS NOTE  Nathan [name] ZOX:[id number] DOB: [birth year] DOA: 03/21/2013 PCP: Provider Not In System  Assessment/Plan: Sepsis: resolved.  Likely tick borne illness. RMSF serology off. Blood cultures are pending. Max temp 99.1. Blood cultures with no growth.doxycycline day #3 and will transition to po. Patient remains hemodynamically stable.  Thrombocytopenia: improving  Lactic Acidosis: Due to #1. Resolved.  Tachycardia: Resolved. Likely related to #1. HR range 80-83. Continue BB Hyponatremia: Likely related to nausea vomiting decreased by mouth intake in the setting of [facility] spotted fever. Resolved.  Seizure: Patient with history of same. States he's not had a seizure and 9 years. Will continue his home medication.  HTN (hypertension): controlled. Home ace inhibitor still on hold.  GERD (gastroesophageal reflux disease): Stable Continue home  Hypokalemia: mild. Resolved UTI: urinalysis and clinical hx. Consistent. Ceftriaxone day #3. Cultures with no growth   Code Status: full Family Communication: none present Disposition Plan: home likely tomorrow  Consultants:  none  Procedures:  none  Antibiotics: Doxycycline 03/21/18 >>>>  Rocephin 03/21/13>>>  HPI/Subjective: Awake watching TV. Denies pain/discomfort. Reports feeling "pretty good"  Objective: Filed Vitals:   03/23/13 0546  BP: 127/68  Pulse: 80  Temp: 99 F (37.2 C)  Resp: 20    Intake/Output Summary (Last 24 hours) at 03/23/13 1138 Last data filed at 03/23/13 0500  Gross per 24 hour  Intake 2310.92 ml  Output    350 ml  Net 1960.92 ml   Filed Weights   03/21/13 1301 03/21/13 1938  Weight: 91.627 kg (202 lb) 92.3 kg (203 lb 7.8 oz)    Exam:  General:  Well nourished NAD  Cardiovascular: RRR No rub no gallup no LE edema  Respiratory: normal effort BS clear to ausculation bilaterally no wheeze  Abdomen: soft +BS non-tender to palpation  Musculoskeletal: no clubbing no  cyanosis   Data Reviewed: Basic Metabolic Panel:  Recent Labs Lab 03/21/13 1330 03/22/13 0553 03/23/13 0532  NA 127* 134* 136  K 3.5 3.4* 3.6  CL 89* 102 104  CO2 25 21 22   GLUCOSE 201* 101* 124*  BUN 20 11 9   CREATININE 1.30 0.87 0.78  CALCIUM 8.5 7.9* 8.0*   Liver Function Tests:  Recent Labs Lab 03/21/13 1330 03/23/13 0532  AST 99* 128*  ALT 51 77*  ALKPHOS 79 75  BILITOT 0.5 0.3  PROT 7.7 5.9*  ALBUMIN 3.7 2.8*   CBC:  Recent Labs Lab 03/21/13 1330 03/22/13 0553 03/23/13 0532  WBC 3.9* 3.0* 5.8  NEUTROABS 3.1  --   --   HGB 14.3 11.8* 11.7*  HCT 39.2 32.5* 31.9*  MCV 87.7 87.4 87.2  PLT 100* 74* 79*   CBG:  Recent Labs Lab 03/21/13 1349  GLUCAP 155*    Recent Results (from the past 240 hour(s))  CULTURE, BLOOD (ROUTINE X 2)     Status: None   Collection Time    03/21/13  1:59 PM      Result Value Range Status   Specimen Description BLOOD LEFT HAND   Final   Special Requests BOTTLES DRAWN AEROBIC AND ANAEROBIC 5CC   Final   Culture NO GROWTH 2 DAYS   Final   Report Status PENDING   Incomplete  CULTURE, BLOOD (ROUTINE X 2)     Status: None   Collection Time    03/21/13  1:59 PM      Result Value Range Status   Specimen Description BLOOD RIGHT HAND   Final   Special Requests  BOTTLES DRAWN AEROBIC AND ANAEROBIC 6CC   Final   Culture NO GROWTH 2 DAYS   Final   Report Status PENDING   Incomplete  URINE CULTURE     Status: None   Collection Time    03/21/13  3:20 PM      Result Value Range Status   Specimen Description URINE, CLEAN CATCH   Final   Special Requests NONE   Final   Culture  Setup Time     Final   Value: 03/21/2013 17:21     Performed at [facility] Count     Final   Value: NO GROWTH     Performed at [facility]   Culture     Final   Value: NO GROWTH     Performed at [facility]   Report Status 03/22/2013 FINAL   Final  CULTURE, BLOOD (ROUTINE X 2)     Status: None   Collection Time     03/22/13  7:55 AM      Result Value Range Status   Specimen Description BLOOD LEFT HAND   Final   Special Requests BOTTLES DRAWN AEROBIC AND ANAEROBIC 8CC   Final   Culture NO GROWTH 1 DAY   Final   Report Status PENDING   Incomplete  CULTURE, BLOOD (ROUTINE X 2)     Status: None   Collection Time    03/22/13  7:56 AM      Result Value Range Status   Specimen Description BLOOD RIGHT HAND   Final   Special Requests BOTTLES DRAWN AEROBIC AND ANAEROBIC 8CC   Final   Culture NO GROWTH 1 DAY   Final   Report Status PENDING   Incomplete     Studies: Dg Chest 2 View  03/21/2013   *RADIOLOGY REPORT*  Clinical Data: Fever, lightheaded.  CHEST - 2 VIEW  Comparison: None.  Findings: Trachea is midline.  Heart size normal.  Lungs are hyperinflated but clear.  No pleural fluid.  Mild pectus deformity.  Degenerative changes in the spine.  Mild anterior wedging of a lower thoracic vertebral body, age indeterminate.  IMPRESSION: No acute findings.   Original Report Authenticated By: Leanna Battles, M.D.   Ct Head Wo Contrast  03/21/2013   CLINICAL DATA:  Head injury following a fall.  EXAM: CT HEAD WITHOUT CONTRAST  TECHNIQUE: Contiguous axial images were obtained from the base of the skull through the vertex without intravenous contrast.  COMPARISON:  None.  FINDINGS: Mildly enlarged ventricles and subarachnoid spaces. Small amount of patchy white matter low density in both cerebral hemispheres and small area of more focal white matter low density in the right frontal lobe adjacent to the frontal horn of the right lateral ventricle. No skull fracture, intracranial hemorrhage or paranasal sinus air-fluid levels.  IMPRESSION: 1. No skull fracture or intracranial hemorrhage. 2. Mild atrophy. 3. Minimal small vessel white matter ischemic changes.   Electronically Signed   By: Gordan Payment   On: 03/21/2013 14:27    Scheduled Meds: . ARIPiprazole  20 mg Oral Daily  . cefTRIAXone (ROCEPHIN)  IV  1 g  Intravenous Q24H  . doxycycline  100 mg Oral Q12H  . loratadine  10 mg Oral Daily  . meloxicam  15 mg Oral Daily  . metoprolol tartrate  25 mg Oral BID  . pantoprazole  40 mg Oral Daily  . phenytoin  200 mg Oral Daily   And  . phenytoin  300 mg Oral QHS  . senna  1 tablet Oral BID  . simvastatin  40 mg Oral QPM  . sodium chloride  3 mL Intravenous Q12H  . tamsulosin  0.4 mg Oral Daily  . venlafaxine XR  150 mg Oral Daily  . zolpidem  5 mg Oral QHS   Continuous Infusions: . sodium chloride 75 mL/hr at 03/22/13 2206    Principal Problem:   Sepsis Active Problems:   Seizure   HTN (hypertension)   GERD (gastroesophageal reflux disease)   Thrombocytopenia   Acidosis   Tachycardia   UTI (lower urinary tract infection)   Hyponatremia   Hypokalemia  Time spent: 30 minutes  [facility] M  [facility] Pager [phone]. If 7PM-7AM, please contact night-coverage at [email or web address], password [facility] 03/23/2013, 11:38 AM  LOS: 2 days   I have seen and examined this patient and I agree with the above assessment and plan.  Pamella Pert, MD [facility] [phone]

## 2013-03-23 NOTE — Plan of Care (Signed)
Problem: Phase I Progression Outcomes Goal: OOB as tolerated unless otherwise ordered Outcome: Completed/Met Date Met:  03/23/13 Ambulated in hallway with wife today.

## 2013-03-24 LAB — COMPREHENSIVE METABOLIC PANEL
Albumin: 3.2 g/dL — ABNORMAL LOW (ref 3.5–5.2)
Alkaline Phosphatase: 95 U/L (ref 39–117)
BUN: 9 mg/dL (ref 6–23)
Potassium: 3.4 mEq/L — ABNORMAL LOW (ref 3.5–5.1)
Sodium: 138 mEq/L (ref 135–145)
Total Protein: 6.7 g/dL (ref 6.0–8.3)

## 2013-03-24 LAB — EHRLICHIA ANTIBODY PANEL
E chaffeensis (HGE) Ab, IgG: <1:64 {titer}
E chaffeensis (HGE) Ab, IgM: <1:20 {titer}

## 2013-03-24 LAB — CBC
MCHC: 36.1 g/dL — ABNORMAL HIGH (ref 30.0–36.0)
RDW: 12.6 % (ref 11.5–15.5)

## 2013-03-24 LAB — PROTIME-INR: Prothrombin Time: 14.2 seconds (ref 11.6–15.2)

## 2013-03-24 MED ORDER — DOXYCYCLINE HYCLATE 100 MG PO TABS: 100.0000 mg | ORAL_TABLET | Freq: Two times a day (BID) | ORAL | Status: DC

## 2013-03-24 MED ORDER — POTASSIUM CHLORIDE CRYS ER 20 MEQ PO TBCR
40.0000 meq | EXTENDED_RELEASE_TABLET | Freq: Once | ORAL | Status: AC
  Administered 2013-03-24: 40 meq via ORAL

## 2013-03-24 NOTE — Discharge Summary (Signed)
Physician Discharge Summary  Nathan [name] WUJ:[id number] DOB: December 25, 1945 DOA: 03/21/2013  PCP: Provider Not In System  Admit date: 03/21/2013 Discharge date: 03/24/2013  Time spent: 35 minutes  Recommendations for Outpatient Follow-up:  1. Followup with her primary care provider on Tuesday   Recommendations for primary care physician for things to follow:  Repeat CBC, CMP  Discharge Diagnoses:  Principal Problem:   Sepsis Active Problems:   Seizure   HTN (hypertension)   GERD (gastroesophageal reflux disease)   Thrombocytopenia   Acidosis   Tachycardia   UTI (lower urinary tract infection)   Hyponatremia   Hypokalemia  Discharge Condition: Stable  Diet recommendation: heart healthy  Filed Weights   03/21/13 1301 03/21/13 1938  Weight: 91.627 kg (202 lb) 92.3 kg (203 lb 7.8 oz)   History of present illness:  Nathan [name] is a 67 y.o. male with past medical history that includes hypertension, hyperlipidemia, seizure disorder, posttraumatic stress disorder presents emergency room with chief complaint of persistent fever, weakness, anorexia nausea and vomiting. Information is obtained from the patient. He indicates that 4 days ago he developed mild weakness with intermittent chills and fever. He also indicates he was extremely fatigued and slept most of the previous 4 days. Yesterday he developed nausea with vomiting. He describes the emesis as yellowish bile. He denies any coffee ground emesis. He also reports that yesterday he fell without losing consciousness. He explains that his legs just gave out. He indicates that he did hit his head but did not lose consciousness. Associated symptoms include fever with a max of 103, anorexia, intermittent incontinence of urine and bowel. Patient denies abdominal pain dysuria hematuria diarrhea constipation or melena. He denies headache or visual disturbances. He denies chest pain palpitations or shortness of breath. He does indicate that [place] and [place] a lot of time in the woods. He has several excoriated areas on his lower extremities bilaterally that he explains as previous tick bites that he continues to scratch. There is also several areas that appear to be similar in nature but completely healed. Lab work in the emergency room is significant for white count of 3.9 platelets of 100 sodium of 127 chloride of 89 and lactic acid of 3.2 and AST of 99, PT of 15.5. His vital signs are significant for an oral temperature of 103 a heart rate of 118 blood pressure 113/68. Urinalysis with many bacteria 3-6 WBCs. CT of the head with no acute findings. Chest x-ray unremarkable. EKG with normal sinus rhythm and PACs. Symptoms came on suddenly have persisted and worsened characterized as moderate to severe.  Hospital Course:   Patient was admitted with sepsis likely secondary to a tick borne illness. Given the high suspicion for that, empiric IV doxycycline was started in the emergency room. Serology was sent for [facility] spotted fever and ehrlichiosis. Patient was persistently febrile on hospital day 2, had low-grade temperature ~99 hospital day 3 and completely afebrile for 24 hours prior to discharge. Initially patient had thrombocytopenia which is likely related to his infection, improving on discharge. Patient also had mild elevation of liver function tests, and he was advised to followup with his PCP early next week for repeat blood work. Initially patient had some evidence of mild urinary tract infection and was treated with IV ceftriaxone for 3 days, however urine culture was negative. His clinical condition continued to improve, and was able to ambulate in the  hallway, tolerate food and had adequate by mouth intake. Patient has remained hemodynamically stable throughout his hospital stay. He is to complete 5 additional days of oral doxycycline after discharge and as mentioned before to  followup with his PCP early next week. On discharge, his [facility] spotted fever serology came back negative, ehrlichiosis is still pending.  Procedures:  None   Consultations:  None   Discharge Exam: Filed Vitals:   03/23/13 0546 03/23/13 1400 03/23/13 1900 03/24/13 0605  BP: 127/68 149/89 139/63 143/75  Pulse: 80 67 71 69  Temp: 99 F (37.2 C) 97.9 F (36.6 C) 98.3 F (36.8 C) 98.6 F (37 C)  TempSrc: Oral Oral Oral Oral  Resp: 20 20 20 20   Height:      Weight:      SpO2: 97% 98% 97% 95%   General: NAD Cardiovascular: RRR Respiratory: CTA biL  Discharge Instructions     Medication List         ARIPiprazole 20 MG tablet  Commonly known as:  ABILIFY  Take 20 mg by mouth at bedtime.     CENTRUM SILVER ADULT 50+ Tabs  Take 1 tablet by mouth at bedtime.     cetirizine 10 MG tablet  Commonly known as:  ZYRTEC  Take 10 mg by mouth at bedtime.     diazepam 5 MG tablet  Commonly known as:  VALIUM  Take 5 mg by mouth daily as needed for anxiety or sleep.     doxycycline 100 MG tablet  Commonly known as:  VIBRA-TABS  Take 1 tablet (100 mg total) by mouth every 12 (twelve) hours.     lisinopril 40 MG tablet  Commonly known as:  PRINIVIL,ZESTRIL  Take 40 mg by mouth every morning.     meloxicam 15 MG tablet  Commonly known as:  MOBIC  Take 15 mg by mouth at bedtime.     metoprolol tartrate 25 MG tablet  Commonly known as:  LOPRESSOR  Take 25 mg by mouth 2 (two) times daily.     omeprazole 20 MG capsule  Commonly known as:  PRILOSEC  Take 20 mg by mouth daily.     phenytoin 100 MG ER capsule  Commonly known as:  DILANTIN  Take 500 mg by mouth at bedtime.     simvastatin 40 MG tablet  Commonly known as:  ZOCOR  Take 40 mg by mouth every evening.     tamsulosin 0.4 MG Caps capsule  Commonly known as:  FLOMAX  Take 0.4 mg by mouth daily.     venlafaxine XR 150 MG 24 hr capsule  Commonly known as:  EFFEXOR-XR  Take 150 mg by mouth daily.      zolpidem 10 MG tablet  Commonly known as:  AMBIEN  Take 10 mg by mouth at bedtime.           Follow-up Information   [facility]. Schedule an appointment as soon as possible for a visit in 3 days.     The results of significant diagnostics from this hospitalization (including imaging, microbiology, ancillary and laboratory) are listed below for reference.    Significant Diagnostic Studies: Dg Chest 2 View 03/21/2013   *RADIOLOGY REPORT*  Clinical Data: Fever, lightheaded.  CHEST - 2 VIEW  Comparison: None.  Findings: Trachea is midline.  Heart size normal.  Lungs are hyperinflated but clear.  No pleural fluid.  Mild pectus deformity.  Degenerative changes in  the spine.  Mild anterior wedging of a lower thoracic vertebral body, age indeterminate.  IMPRESSION: No acute findings.   Original Report Authenticated By: Leanna Battles, M.D.   Ct Head Wo Contrast 03/21/2013   CLINICAL DATA:  Head injury following a fall.  EXAM: CT HEAD WITHOUT CONTRAST  TECHNIQUE: Contiguous axial images were obtained from the base of the skull through the vertex without intravenous contrast.  COMPARISON:  None.  FINDINGS: Mildly enlarged ventricles and subarachnoid spaces. Small amount of patchy white matter low density in both cerebral hemispheres and small area of more focal white matter low density in the right frontal lobe adjacent to the frontal horn of the right lateral ventricle. No skull fracture, intracranial hemorrhage or paranasal sinus air-fluid levels.  IMPRESSION: 1. No skull fracture or intracranial hemorrhage. 2. Mild atrophy. 3. Minimal small vessel white matter ischemic changes.   Electronically Signed   By: Gordan Payment   On: 03/21/2013 14:27   Microbiology: Recent Results (from the past 240 hour(s))  CULTURE, BLOOD (ROUTINE X 2)     Status: None   Collection Time    03/21/13  1:59 PM      Result Value Range Status   Specimen Description BLOOD LEFT HAND   Final    Special Requests BOTTLES DRAWN AEROBIC AND ANAEROBIC 5CC   Final   Culture NO GROWTH 3 DAYS   Final   Report Status PENDING   Incomplete  CULTURE, BLOOD (ROUTINE X 2)     Status: None   Collection Time    03/21/13  1:59 PM      Result Value Range Status   Specimen Description BLOOD RIGHT HAND   Final   Special Requests BOTTLES DRAWN AEROBIC AND ANAEROBIC 6CC   Final   Culture NO GROWTH 3 DAYS   Final   Report Status PENDING   Incomplete  URINE CULTURE     Status: None   Collection Time    03/21/13  3:20 PM      Result Value Range Status   Specimen Description URINE, CLEAN CATCH   Final   Special Requests NONE   Final   Culture  Setup Time     Final   Value: 03/21/2013 17:21     Performed at [facility] Count     Final   Value: NO GROWTH     Performed at [facility]   Culture     Final   Value: NO GROWTH     Performed at [facility]   Report Status 03/22/2013 FINAL   Final  CULTURE, BLOOD (ROUTINE X 2)     Status: None   Collection Time    03/22/13  7:55 AM      Result Value Range Status   Specimen Description BLOOD LEFT HAND   Final   Special Requests BOTTLES DRAWN AEROBIC AND ANAEROBIC 8CC   Final   Culture NO GROWTH 2 DAYS   Final   Report Status PENDING   Incomplete  CULTURE, BLOOD (ROUTINE X 2)     Status: None   Collection Time    03/22/13  7:56 AM      Result Value Range Status   Specimen Description BLOOD RIGHT HAND   Final   Special Requests BOTTLES DRAWN AEROBIC AND ANAEROBIC 8CC   Final   Culture NO GROWTH 2 DAYS   Final   Report Status PENDING   Incomplete   Labs: Basic Metabolic Panel:  Recent Labs Lab 03/21/13 1330 03/22/13 0553 03/23/13 0532 03/24/13 0606  NA 127* 134* 136 138  K 3.5 3.4* 3.6 3.4*  CL 89* 102 104 102  CO2 25 21 22 25   GLUCOSE 201* 101* 124* 114*  BUN 20 11 9 9   CREATININE 1.30 0.87 0.78 0.75  CALCIUM 8.5 7.9* 8.0* 8.8   Liver Function Tests:  Recent Labs Lab 03/21/13 1330  03/23/13 0532 03/24/13 0606  AST 99* 128* 150*  ALT 51 77* 111*  ALKPHOS 79 75 95  BILITOT 0.5 0.3 0.4  PROT 7.7 5.9* 6.7  ALBUMIN 3.7 2.8* 3.2*   CBC:  Recent Labs Lab 03/21/13 1330 03/22/13 0553 03/23/13 0532 03/24/13 0606  WBC 3.9* 3.0* 5.8 9.1  NEUTROABS 3.1  --   --   --   HGB 14.3 11.8* 11.7* 12.2*  HCT 39.2 32.5* 31.9* 33.8*  MCV 87.7 87.4 87.2 86.7  PLT 100* 74* 79* 117*   CBG:  Recent Labs Lab 03/21/13 1349  GLUCAP 155*   Signed:  GHERGHE, COSTIN  [facility] 03/24/2013, 2:47 PM

## 2013-03-24 NOTE — Progress Notes (Signed)
Patient states understanding of discharge, prescriptions given 

## 2013-03-26 LAB — CULTURE, BLOOD (ROUTINE X 2): Culture: NO GROWTH

## 2013-03-27 LAB — CULTURE, BLOOD (ROUTINE X 2): Culture: NO GROWTH

## 2013-05-31 ENCOUNTER — Other Ambulatory Visit: Payer: Self-pay

## 2013-07-18 ENCOUNTER — Emergency Department (HOSPITAL_COMMUNITY)
Admission: EM | Admit: 2013-07-18 | Discharge: 2013-07-18 | Disposition: A | Discharge status: Home / Self Care | Payer: Medicare Other | Attending: Emergency Medicine | Admitting: Emergency Medicine

## 2013-07-18 ENCOUNTER — Emergency Department (HOSPITAL_COMMUNITY): Payer: Medicare Other

## 2013-07-18 ENCOUNTER — Encounter (HOSPITAL_COMMUNITY): Payer: Self-pay | Admitting: Emergency Medicine

## 2013-07-18 DIAGNOSIS — G40909 Epilepsy, unspecified, not intractable, without status epilepticus: Secondary | ICD-10-CM | POA: Insufficient documentation

## 2013-07-18 DIAGNOSIS — F4310 Post-traumatic stress disorder, unspecified: Secondary | ICD-10-CM | POA: Insufficient documentation

## 2013-07-18 DIAGNOSIS — Z791 Long term (current) use of non-steroidal anti-inflammatories (NSAID): Secondary | ICD-10-CM | POA: Insufficient documentation

## 2013-07-18 DIAGNOSIS — Z79899 Other long term (current) drug therapy: Secondary | ICD-10-CM | POA: Insufficient documentation

## 2013-07-18 DIAGNOSIS — I82409 Acute embolism and thrombosis of unspecified deep veins of unspecified lower extremity: Secondary | ICD-10-CM | POA: Insufficient documentation

## 2013-07-18 DIAGNOSIS — E785 Hyperlipidemia, unspecified: Secondary | ICD-10-CM | POA: Insufficient documentation

## 2013-07-18 DIAGNOSIS — K219 Gastro-esophageal reflux disease without esophagitis: Secondary | ICD-10-CM | POA: Insufficient documentation

## 2013-07-18 DIAGNOSIS — I10 Essential (primary) hypertension: Secondary | ICD-10-CM | POA: Insufficient documentation

## 2013-07-18 DIAGNOSIS — Z87448 Personal history of other diseases of urinary system: Secondary | ICD-10-CM | POA: Insufficient documentation

## 2013-07-18 DIAGNOSIS — F17200 Nicotine dependence, unspecified, uncomplicated: Secondary | ICD-10-CM | POA: Insufficient documentation

## 2013-07-18 HISTORY — DX: Disorder of kidney and ureter, unspecified: N28.9

## 2013-07-18 LAB — CBC WITH DIFFERENTIAL/PLATELET
Basophils Absolute: 0 10*3/uL (ref 0.0–0.1)
Eosinophils Relative: 4 % (ref 0–5)
Hemoglobin: 10.9 g/dL — ABNORMAL LOW (ref 13.0–17.0)
Lymphocytes Relative: 24 % (ref 12–46)
Lymphs Abs: 2.8 10*3/uL (ref 0.7–4.0)
MCH: 31.4 pg (ref 26.0–34.0)
MCV: 90.2 fL (ref 78.0–100.0)
Monocytes Relative: 7 % (ref 3–12)
Neutrophils Relative %: 66 % (ref 43–77)
Platelets: 121 10*3/uL — ABNORMAL LOW (ref 150–400)
RBC: 3.47 MIL/uL — ABNORMAL LOW (ref 4.22–5.81)
WBC: 11.5 10*3/uL — ABNORMAL HIGH (ref 4.0–10.5)

## 2013-07-18 LAB — BASIC METABOLIC PANEL
BUN: 18 mg/dL (ref 6–23)
CO2: 28 mEq/L (ref 19–32)
Chloride: 101 mEq/L (ref 96–112)
Glucose, Bld: 122 mg/dL — ABNORMAL HIGH (ref 70–99)
Potassium: 4.6 mEq/L (ref 3.5–5.1)
Sodium: 138 mEq/L (ref 135–145)

## 2013-07-18 MED ORDER — XARELTO VTE STARTER PACK 15 & 20 MG PO TBPK: 15.0000 mg | ORAL_TABLET | ORAL | Status: DC

## 2013-07-18 MED ORDER — RIVAROXABAN 15 MG PO TABS
15.0000 mg | ORAL_TABLET | Freq: Once | ORAL | Status: AC
  Administered 2013-07-18: 15 mg via ORAL
  Filled 2013-07-18: qty 1

## 2013-07-18 NOTE — ED Notes (Signed)
Pharmacy to bring xarelto

## 2013-07-18 NOTE — ED Notes (Addendum)
Pt c/o cramping in r lower leg x 3 days.  Reports woke up this morning with swelling to lower leg.    Wife also reports for the past few weeks pt has been extremely fatigued, nausea, no appetite.  Reports has lost over 20lb since Thanksgiving.  Pt also reports had acute renal failure approx 1 week ago.

## 2013-07-18 NOTE — ED Provider Notes (Signed)
CSN: [id number]     Arrival date & time 07/18/13  4098 History   First MD Initiated Contact with Patient 07/18/13 1002     Chief Complaint  Patient presents with  . Leg Pain   (Consider location/radiation/quality/duration/timing/severity/associated sxs/prior Treatment) HPI Comments: Patient is a 67 year old male with a history of renal disorder, posttraumatic stress disorder, seizures, hypertension, hyperlipidemia, and gastroesophageal reflux disease. The patient presents to the emergency department with complaint of right leg pain and swelling. The patient states that approximately 3 days ago he had severe cramping. He had less cramping on yesterday but today he noted some cramping in his significant swelling of the right lower extremity. The patient denies any recent injury or, onto the area. He's not had any previous operations involving the right lower extremity. The patient also complains of not having much energy, having been treated recently for an ear infection, he has had decreased appetite for approximately one month and has lost nearly 20 pounds in a month. The patient has no difficulty with his walking, but has not had any recent falls. He denies any fever or chills. He's not had this situation before.  Patient is a 67 y.o. male presenting with leg pain. The history is provided by the patient.  Leg Pain Location:  Leg Time since incident:  3 days Injury: no   Leg location:  R leg Associated symptoms: no back pain and no neck pain     Past Medical History  Diagnosis Date  . PTSD (post-traumatic stress disorder)   . Seizure   . Hyperlipidemia   . HTN (hypertension)   . GERD (gastroesophageal reflux disease)   . Renal disorder    History reviewed. No pertinent past surgical history. No family history on file. History  Substance Use Topics  . Smoking status: Current Some Day Smoker    Types: Pipe  . Smokeless tobacco: Not on file  . Alcohol Use: No    Review of Systems   Constitutional: Negative for activity change.       All ROS Neg except as noted in HPI  HENT: Negative for nosebleeds.   Eyes: Negative for photophobia and discharge.  Respiratory: Negative for cough, shortness of breath and wheezing.   Cardiovascular: Negative for chest pain and palpitations.  Gastrointestinal: Negative for abdominal pain and blood in stool.  Genitourinary: Negative for dysuria, frequency and hematuria.  Musculoskeletal: Negative for arthralgias, back pain and neck pain.  Skin: Negative.   Neurological: Positive for seizures. Negative for dizziness and speech difficulty.  Psychiatric/Behavioral: Negative for hallucinations and confusion.    Allergies  Review of patient's allergies indicates no known allergies.  Home Medications   Current Outpatient Rx  Name  Route  Sig  Dispense  Refill  . acetaminophen (TYLENOL) 500 MG tablet   Oral   Take 500-1,000 mg by mouth every 6 (six) hours as needed.         . ARIPiprazole (ABILIFY) 20 MG tablet   Oral   Take 20 mg by mouth at bedtime.         . diazepam (VALIUM) 5 MG tablet   Oral   Take 5 mg by mouth daily as needed for anxiety or sleep.         Marland Kitchen lisinopril (PRINIVIL,ZESTRIL) 40 MG tablet   Oral   Take 40 mg by mouth every morning.         . loratadine (CLARITIN) 10 MG tablet   Oral   Take 10 mg  by mouth daily.         . meloxicam (MOBIC) 15 MG tablet   Oral   Take 15 mg by mouth at bedtime.         . metoprolol succinate (TOPROL-XL) 25 MG 24 hr tablet   Oral   Take 25 mg by mouth 2 (two) times daily.          . Multiple Vitamins-Minerals (CENTRUM SILVER ADULT 50+) TABS   Oral   Take 1 tablet by mouth at bedtime.         Marland Kitchen omeprazole (PRILOSEC) 20 MG capsule   Oral   Take 20 mg by mouth daily.         . phenytoin (DILANTIN) 100 MG ER capsule   Oral   Take 500 mg by mouth at bedtime.         . simvastatin (ZOCOR) 40 MG tablet   Oral   Take 40 mg by mouth every evening.          . tamsulosin (FLOMAX) 0.4 MG CAPS capsule   Oral   Take 0.4 mg by mouth daily.         Marland Kitchen venlafaxine XR (EFFEXOR-XR) 150 MG 24 hr capsule   Oral   Take 150 mg by mouth daily.         Marland Kitchen zolpidem (AMBIEN) 10 MG tablet   Oral   Take 10 mg by mouth at bedtime.          BP 141/65  Pulse 85  Temp(Src) 98.1 F (36.7 C) (Oral)  Resp 18  SpO2 97% Physical Exam  Nursing note and vitals reviewed. Constitutional: He is oriented to person, place, and time. He appears well-developed and well-nourished.  Non-toxic appearance.  HENT:  Head: Normocephalic.  Right Ear: Tympanic membrane and external ear normal.  Left Ear: Tympanic membrane and external ear normal.  Eyes: EOM and lids are normal. Pupils are equal, round, and reactive to light.  Neck: Normal range of motion. Neck supple. Carotid bruit is not present.  Cardiovascular: Normal rate, regular rhythm, normal heart sounds, intact distal pulses and normal pulses.   Pulmonary/Chest: Breath sounds normal. No respiratory distress.  Abdominal: Soft. Bowel sounds are normal. There is no tenderness. There is no guarding.  Musculoskeletal: Normal range of motion.  The right leg is swollen 2 times the size of the left. There is increased redness from just above the posterior knee to the ankle. There is a positive Homans sign. Dorsalis pedis pulses are 2+ bilaterally. The Achilles tendon is intact. There no lesions noted between the toes. There is no noted puncture wound of the plantar surface.  Lymphadenopathy:       Head (right side): No submandibular adenopathy present.       Head (left side): No submandibular adenopathy present.    He has no cervical adenopathy.  Neurological: He is alert and oriented to person, place, and time. He has normal strength. No cranial nerve deficit or sensory deficit.  Skin: Skin is warm and dry.  Psychiatric: He has a normal mood and affect. His speech is normal.    ED Course  Procedures  (including critical care time) Labs Review Labs Reviewed  CBC WITH DIFFERENTIAL  BASIC METABOLIC PANEL   Imaging Review No results found.  EKG Interpretation   None       MDM  No diagnosis found. *I have reviewed nursing notes, vital signs, and all appropriate lab and imaging results for this patient.**  Complete blood count reveals an elevation in white blood cells of 11,500. The hemoglobin is 10.9, the hematocrit is 31.3 both of which are low. The platelets are low at 121,000. The basic metabolic panel is within normal limits with exception of glucose being 122. Venous ultrasound of the right lower extremity reveals an acute deep vein thrombosis within the right superficial femoral and popliteal veins.  The patient was instructed on the findings of today's visit. The patient is treated with a rolled from, 15 mg 2 times daily for the first 21 days, and then starting on the 20 mg tablets. Patient is advised to use elevation. He is to see his primary physician in the next few days for office evaluation and management of this issue. Patient is given instructions to return if any changes in his condition difficulty breathing, high fever, or any other changes or cause concern.  Kathie [name], PA-C 07/20/13 [phone]

## 2013-07-20 NOTE — ED Provider Notes (Signed)
Medical screening examination/treatment/procedure(s) were performed by non-physician practitioner and as supervising physician I was immediately available for consultation/collaboration.  EKG Interpretation   None         Antigone Crowell L Paxtyn Boyar, MD 07/20/13 1536 

## 2014-07-26 HISTORY — PX: SHOULDER ARTHROSCOPY: SHX128

## 2014-08-22 IMAGING — US US RENAL
1 series · 14 of 22 positions shown · non-contrast
Comparison: None.

CLINICAL DATA: Abnormal prostate on physical examination.

RENAL/URINARY TRACT ULTRASOUND COMPLETE

[Series 1: us renal · 0.28mm/px · 14 of 22 slices shown]
[im 1/22]
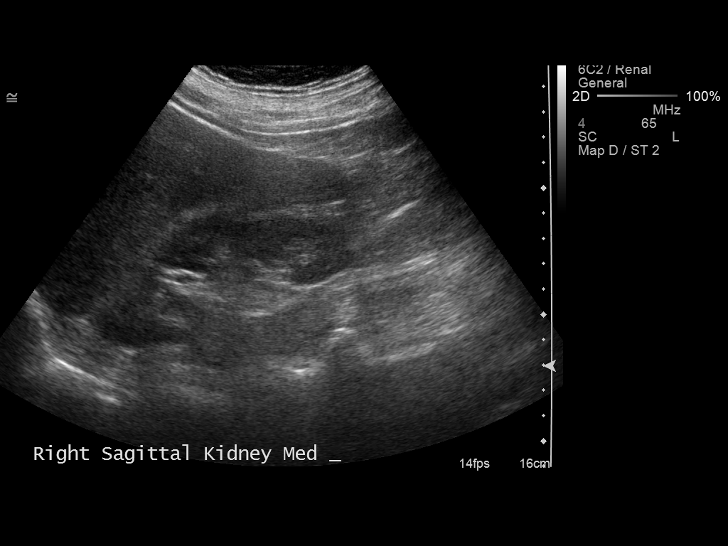
[im 3/22]
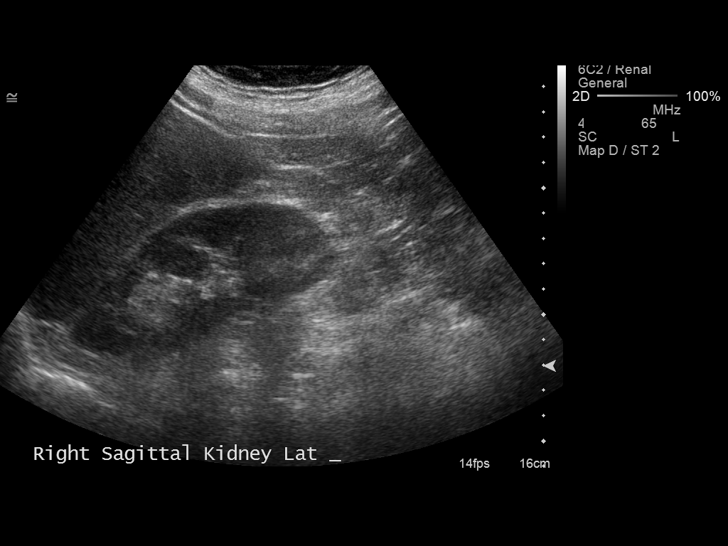
[im 4/22]
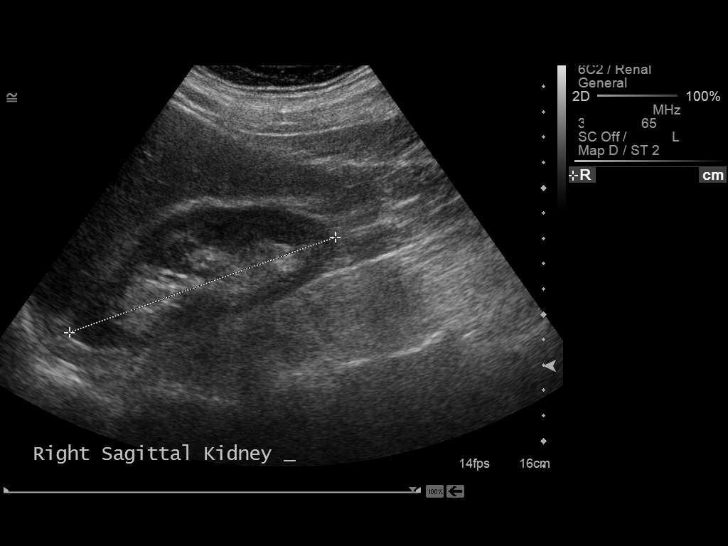
[im 6/22]
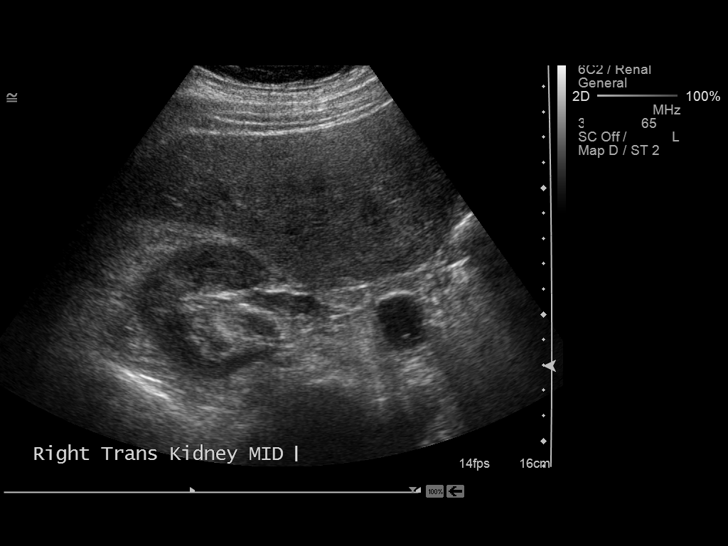
[im 8/22]
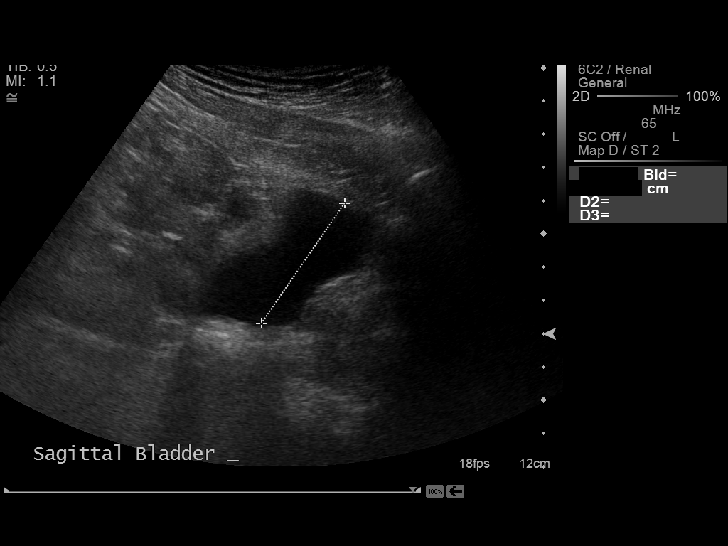
[im 9/22]
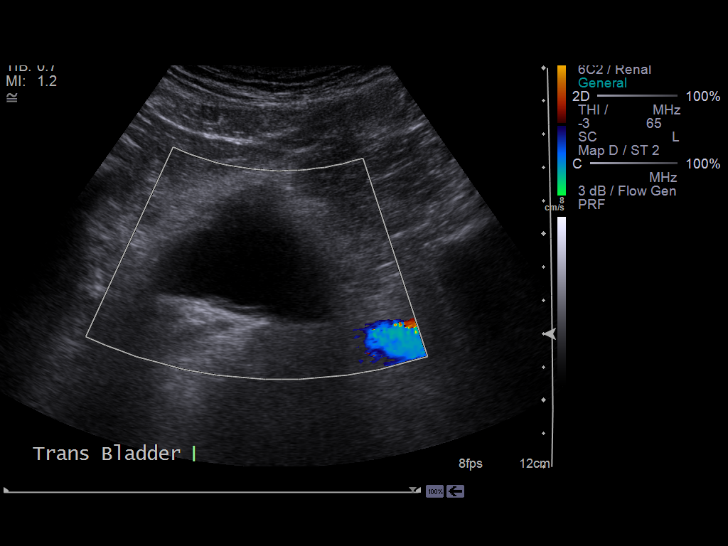
[im 11/22]
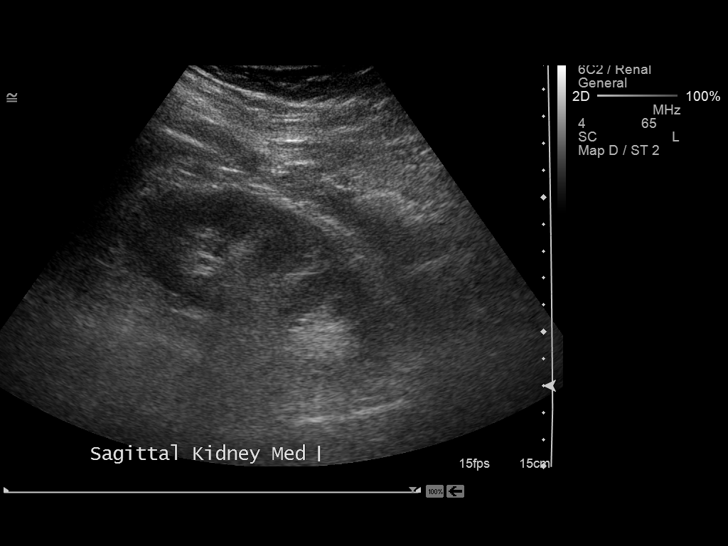
[im 12/22]
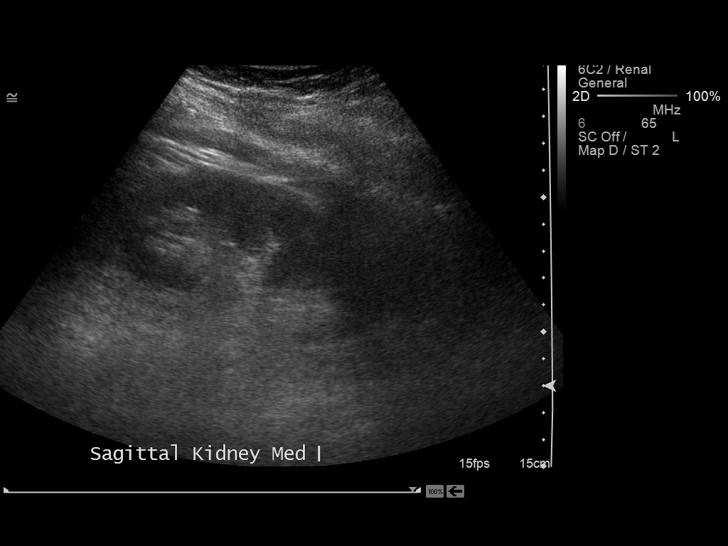
[im 14/22]
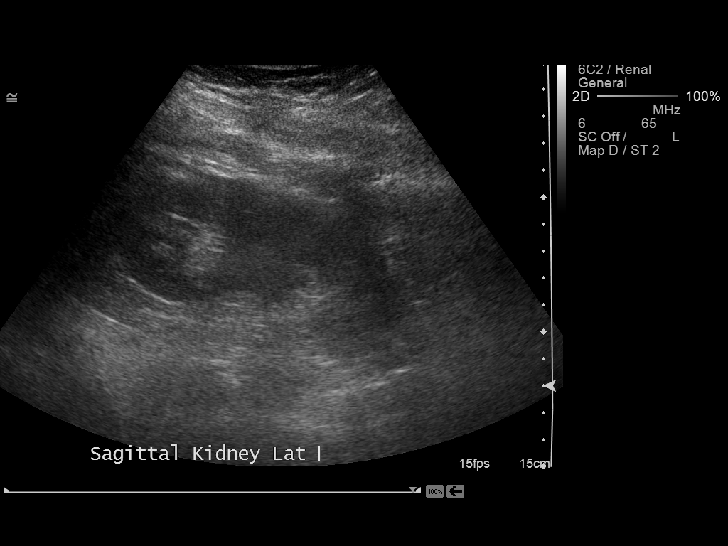
[im 15/22]
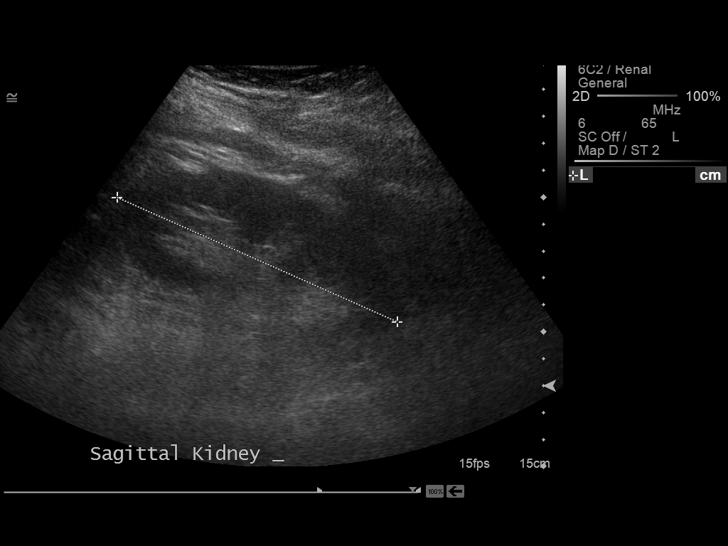
[im 17/22]
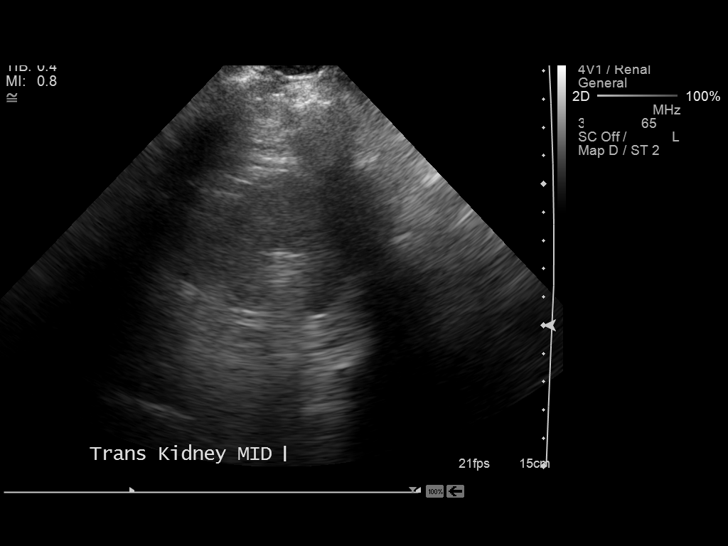
[im 19/22]
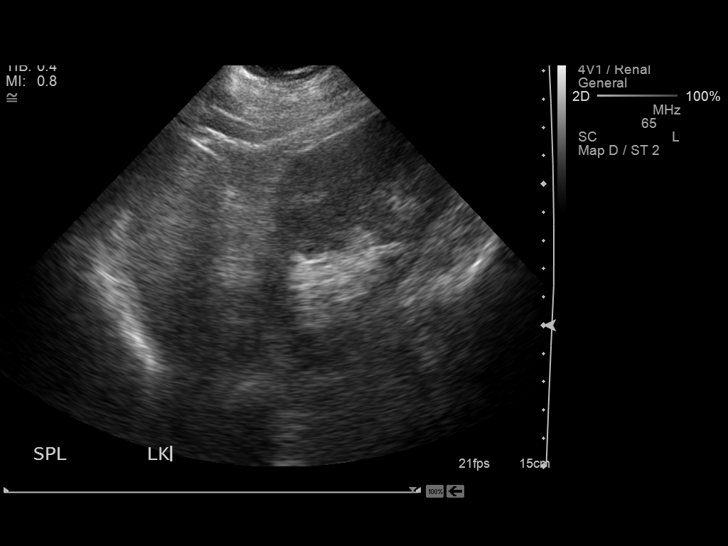
[im 20/22]
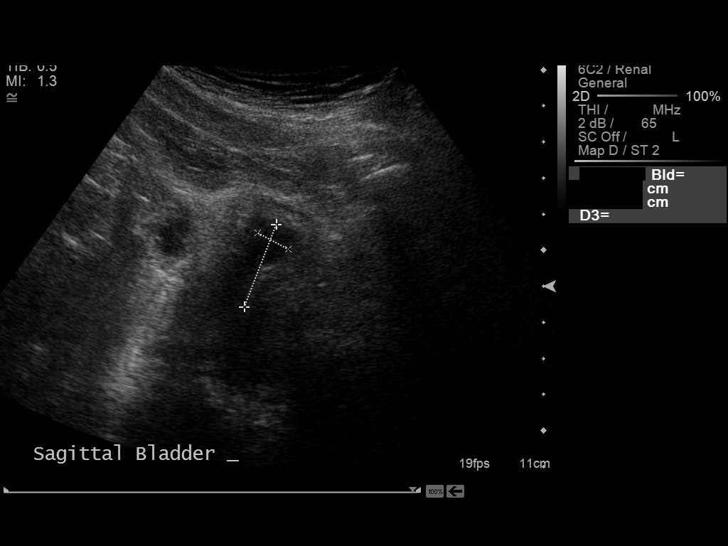
[im 22/22]
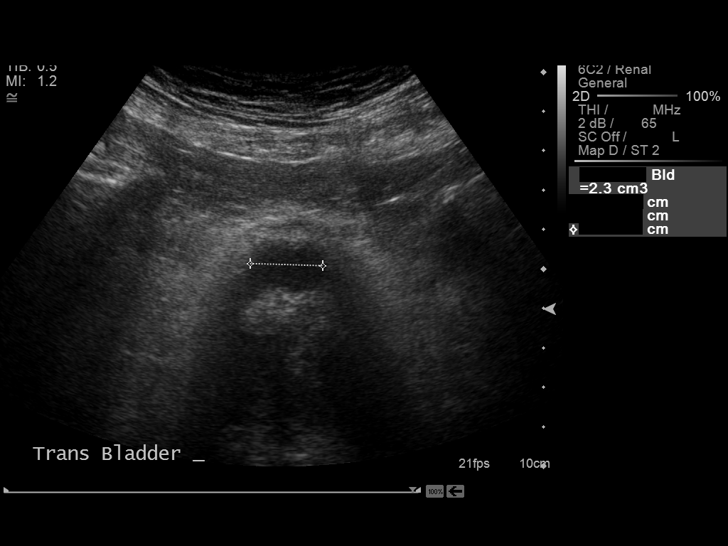

[14 of 22 positions shown; findings below may reference images not displayed]

FINDINGS: Right Kidney:  Measures 11.2 cm and appears normal without stone,
mass or hydronephrosis.

Left Kidney:  Measures 11.4 cm appears normal without stone, mass
or hydronephrosis.

Bladder:  Urinary bladder is nearly completely decompressed.  There
is partial visualization of the prostate gland which measures
approximately 2.8 cm by 3.2 cm.
IMPRESSION: Negative for hydronephrosis or acute abnormality.  Partial
visualization of a mildly prominent prostate gland.

## 2015-01-26 ENCOUNTER — Emergency Department (HOSPITAL_COMMUNITY): Payer: Medicare Other

## 2015-01-26 ENCOUNTER — Encounter (HOSPITAL_COMMUNITY): Payer: Self-pay

## 2015-01-26 ENCOUNTER — Emergency Department (HOSPITAL_COMMUNITY)
Admission: EM | Admit: 2015-01-26 | Discharge: 2015-01-26 | Disposition: A | Discharge status: Home / Self Care | Payer: Medicare Other | Attending: Emergency Medicine | Admitting: Emergency Medicine

## 2015-01-26 DIAGNOSIS — Z7901 Long term (current) use of anticoagulants: Secondary | ICD-10-CM | POA: Diagnosis not present

## 2015-01-26 DIAGNOSIS — F4310 Post-traumatic stress disorder, unspecified: Secondary | ICD-10-CM | POA: Diagnosis not present

## 2015-01-26 DIAGNOSIS — E860 Dehydration: Secondary | ICD-10-CM

## 2015-01-26 DIAGNOSIS — Z720 Tobacco use: Secondary | ICD-10-CM | POA: Diagnosis not present

## 2015-01-26 DIAGNOSIS — Z87448 Personal history of other diseases of urinary system: Secondary | ICD-10-CM | POA: Diagnosis not present

## 2015-01-26 DIAGNOSIS — G40909 Epilepsy, unspecified, not intractable, without status epilepticus: Secondary | ICD-10-CM | POA: Insufficient documentation

## 2015-01-26 DIAGNOSIS — E785 Hyperlipidemia, unspecified: Secondary | ICD-10-CM | POA: Insufficient documentation

## 2015-01-26 DIAGNOSIS — I10 Essential (primary) hypertension: Secondary | ICD-10-CM | POA: Insufficient documentation

## 2015-01-26 DIAGNOSIS — W57XXXA Bitten or stung by nonvenomous insect and other nonvenomous arthropods, initial encounter: Secondary | ICD-10-CM

## 2015-01-26 DIAGNOSIS — T148 Other injury of unspecified body region: Secondary | ICD-10-CM | POA: Insufficient documentation

## 2015-01-26 DIAGNOSIS — M791 Myalgia, unspecified site: Secondary | ICD-10-CM

## 2015-01-26 DIAGNOSIS — K219 Gastro-esophageal reflux disease without esophagitis: Secondary | ICD-10-CM | POA: Insufficient documentation

## 2015-01-26 DIAGNOSIS — R509 Fever, unspecified: Secondary | ICD-10-CM | POA: Diagnosis present

## 2015-01-26 DIAGNOSIS — Z79899 Other long term (current) drug therapy: Secondary | ICD-10-CM | POA: Insufficient documentation

## 2015-01-26 LAB — CBC WITH DIFFERENTIAL/PLATELET
BASOS PCT: 0 % (ref 0–1)
Basophils Absolute: 0 10*3/uL (ref 0.0–0.1)
EOS ABS: 0 10*3/uL (ref 0.0–0.7)
Eosinophils Relative: 0 % (ref 0–5)
HCT: 34.9 % — ABNORMAL LOW (ref 39.0–52.0)
Hemoglobin: 12.1 g/dL — ABNORMAL LOW (ref 13.0–17.0)
Lymphocytes Relative: 9 % — ABNORMAL LOW (ref 12–46)
Lymphs Abs: 1.1 10*3/uL (ref 0.7–4.0)
MCH: 31.7 pg (ref 26.0–34.0)
MCHC: 34.7 g/dL (ref 30.0–36.0)
MCV: 91.4 fL (ref 78.0–100.0)
MONO ABS: 0.4 10*3/uL (ref 0.1–1.0)
Monocytes Relative: 4 % (ref 3–12)
NEUTROS PCT: 87 % — AB (ref 43–77)
Neutro Abs: 10.3 10*3/uL — ABNORMAL HIGH (ref 1.7–7.7)
Platelets: 173 10*3/uL (ref 150–400)
RBC: 3.82 MIL/uL — ABNORMAL LOW (ref 4.22–5.81)
RDW: 12.6 % (ref 11.5–15.5)
WBC: 11.8 10*3/uL — ABNORMAL HIGH (ref 4.0–10.5)

## 2015-01-26 LAB — COMPREHENSIVE METABOLIC PANEL
ALK PHOS: 67 U/L (ref 38–126)
ALT: 21 U/L (ref 17–63)
AST: 41 U/L (ref 15–41)
Albumin: 3.3 g/dL — ABNORMAL LOW (ref 3.5–5.0)
Anion gap: 9 (ref 5–15)
BUN: 21 mg/dL — AB (ref 6–20)
CO2: 24 mmol/L (ref 22–32)
CREATININE: 1.39 mg/dL — AB (ref 0.61–1.24)
Calcium: 7.8 mg/dL — ABNORMAL LOW (ref 8.9–10.3)
Chloride: 100 mmol/L — ABNORMAL LOW (ref 101–111)
GFR calc Af Amer: 58 mL/min — ABNORMAL LOW (ref >60)
GFR calc non Af Amer: 50 mL/min — ABNORMAL LOW (ref >60)
GLUCOSE: 157 mg/dL — AB (ref 65–99)
Potassium: 3.3 mmol/L — ABNORMAL LOW (ref 3.5–5.1)
Sodium: 133 mmol/L — ABNORMAL LOW (ref 135–145)
Total Bilirubin: 0.6 mg/dL (ref 0.3–1.2)
Total Protein: 6.7 g/dL (ref 6.5–8.1)

## 2015-01-26 LAB — TROPONIN I
Troponin I: 0.09 ng/mL — ABNORMAL HIGH (ref <0.031)
Troponin I: 0.06 ng/mL — ABNORMAL HIGH (ref <0.031)

## 2015-01-26 LAB — URINALYSIS, ROUTINE W REFLEX MICROSCOPIC
BILIRUBIN URINE: NEGATIVE
Glucose, UA: NEGATIVE mg/dL
Ketones, ur: NEGATIVE mg/dL
Leukocytes, UA: NEGATIVE
Nitrite: NEGATIVE
PH: 6.5 (ref 5.0–8.0)
Protein, ur: NEGATIVE mg/dL
Specific Gravity, Urine: 1.01 (ref 1.005–1.030)
Urobilinogen, UA: 0.2 mg/dL (ref 0.0–1.0)

## 2015-01-26 LAB — I-STAT CG4 LACTIC ACID, ED: Lactic Acid, Venous: 1.11 mmol/L (ref 0.5–2.0)

## 2015-01-26 LAB — LACTIC ACID, PLASMA: Lactic Acid, Venous: 1.3 mmol/L (ref 0.5–2.0)

## 2015-01-26 LAB — URINE MICROSCOPIC-ADD ON

## 2015-01-26 MED ORDER — SODIUM CHLORIDE 0.9 % IV BOLUS (SEPSIS)
1000.0000 mL | INTRAVENOUS | Status: AC
  Administered 2015-01-26 (×3): 1000 mL via INTRAVENOUS

## 2015-01-26 MED ORDER — DOXYCYCLINE HYCLATE 100 MG PO CAPS: 100.0000 mg | ORAL_CAPSULE | Freq: Two times a day (BID) | ORAL | Status: DC

## 2015-01-26 MED ORDER — ONDANSETRON 8 MG PO TBDP: 8.0000 mg | ORAL_TABLET | Freq: Three times a day (TID) | ORAL | Status: DC | PRN

## 2015-01-26 NOTE — ED Provider Notes (Signed)
CSN: [id number]     Arrival date & time 01/26/15  0913 History  This chart was scribed for Dorie Rank, MD by Starleen Arms, ED Scribe. This patient was seen in room [facility]10/[facility]10 and the patient's care was started at 9:26 AM.   Chief Complaint  Patient presents with  . Fever   The history is provided by the patient. No language interpreter was used.   HPI Comments: AVARY PITSENBARGER is a 69 y.o. male who presents to the Emergency Department complaining of a fever TMAX 102 with associated extreme fatigue,  Increased sleep, generalized weakness onset 3 days ago.  He also reports a slight cough several episodes of vomiting yesterday, decreased urine output.  He requires assistance to walk due himseto his generalized weakness and soiled his bed last night; both of which are atypical for him.  He denies dysuria, CP, SOB, headache.  Patient does admit that he has had several tick bites recently. He denies any rashes.  Past Medical History  Diagnosis Date  . PTSD (post-traumatic stress disorder)   . Seizure   . Hyperlipidemia   . HTN (hypertension)   . GERD (gastroesophageal reflux disease)   . Renal disorder    History reviewed. No pertinent past surgical history. No family history on file. History  Substance Use Topics  . Smoking status: Current Some Day Smoker    Types: Pipe  . Smokeless tobacco: Not on file  . Alcohol Use: No    Review of Systems  Constitutional: Positive for fever and fatigue.  Respiratory: Negative for shortness of breath.   Cardiovascular: Negative for chest pain.  Genitourinary: Negative for dysuria.  Neurological: Negative for headaches.  All other systems reviewed and are negative.     Allergies  Terazosin  Home Medications   Prior to Admission medications   Medication Sig Start Date End Date Taking? Authorizing Provider  ARIPiprazole (ABILIFY) 20 MG tablet Take 10 mg by mouth daily.    Yes Historical Provider, MD  cetirizine (ZYRTEC) 10 MG tablet Take 10  mg by mouth daily.   Yes Historical Provider, MD  diazepam (VALIUM) 5 MG tablet Take 5 mg by mouth daily.    Yes Historical Provider, MD  lisinopril (PRINIVIL,ZESTRIL) 40 MG tablet Take 40 mg by mouth every morning.   Yes Historical Provider, MD  meloxicam (MOBIC) 15 MG tablet Take 15 mg by mouth at bedtime.   Yes Historical Provider, MD  Menthol-Methyl Salicylate (THERA-GESIC EX) Apply 1 application topically 3 (three) times daily.   Yes Historical Provider, MD  metoprolol succinate (TOPROL-XL) 25 MG 24 hr tablet Take 25 mg by mouth 2 (two) times daily.    Yes Historical Provider, MD  omeprazole (PRILOSEC) 20 MG capsule Take 20 mg by mouth daily.   Yes Historical Provider, MD  phenytoin (DILANTIN) 100 MG ER capsule Take 100 mg by mouth 5 (five) times daily.    Yes Historical Provider, MD  simvastatin (ZOCOR) 40 MG tablet Take 20 mg by mouth at bedtime.    Yes Historical Provider, MD  tamsulosin (FLOMAX) 0.4 MG CAPS capsule Take 0.4 mg by mouth daily.   Yes Historical Provider, MD  venlafaxine XR (EFFEXOR-XR) 150 MG 24 hr capsule Take 150 mg by mouth daily.   Yes Historical Provider, MD  warfarin (COUMADIN) 5 MG tablet Take 5-7.5 mg by mouth See admin instructions. Take 5 mg daily, except take 7.5 mg on Mondays.   Yes Historical Provider, MD  zolpidem (AMBIEN) 10 MG tablet Take  10 mg by mouth at bedtime as needed for sleep. Max 3-5 tablets per week.   Yes Historical Provider, MD  XARELTO STARTER PACK 15 & 20 MG TBPK Take 15-20 mg by mouth as directed. Take as directed on package: Start with one 15mg  tablet by mouth twice a day with food. On Day 22, switch to one 20mg  tablet once a day with food. Patient not taking: Reported on 01/26/2015 07/18/13   Lily Kocher, PA-C   BP 115/95 mmHg  Pulse 71  Temp(Src) 98 F (36.7 C) (Oral)  Resp 21  Ht 6' (1.829 m)  Wt 215 lb (97.523 kg)  BMI 29.15 kg/m2  SpO2 96% Physical Exam  Constitutional: No distress.  HENT:  Head: Normocephalic and atraumatic.   Right Ear: External ear normal.  Left Ear: External ear normal.  Eyes: Conjunctivae are normal. Right eye exhibits no discharge. Left eye exhibits no discharge. No scleral icterus.  Neck: Neck supple. No tracheal deviation present.  Cardiovascular: Normal rate, regular rhythm and intact distal pulses.   Pulmonary/Chest: Effort normal and breath sounds normal. No stridor. No respiratory distress. He has no wheezes. He has no rales.  Abdominal: Soft. Bowel sounds are normal. He exhibits no distension. There is no tenderness. There is no rebound and no guarding.  Musculoskeletal: He exhibits no edema or tenderness.  Neurological: He is alert. He has normal strength. No cranial nerve deficit (no facial droop, extraocular movements intact, no slurred speech) or sensory deficit. He exhibits normal muscle tone. He displays no seizure activity. Coordination normal.  Skin: Skin is warm and dry. No rash noted.  Psychiatric: He has a normal mood and affect.  Nursing note and vitals reviewed.   ED Course  Procedures (including critical care time)  DIAGNOSTIC STUDIES: Oxygen Saturation is 97% on RA, normal by my interpretation.    COORDINATION OF CARE:  9:32 AM Discussed treatment plan with patient at bedside.  Patient acknowledges and agrees with plan.    Labs Review Labs Reviewed  COMPREHENSIVE METABOLIC PANEL - Abnormal; Notable for the following:    Sodium 133 (*)    Potassium 3.3 (*)    Chloride 100 (*)    Glucose, Bld 157 (*)    BUN 21 (*)    Creatinine, Ser 1.39 (*)    Calcium 7.8 (*)    Albumin 3.3 (*)    GFR calc non Af Amer 50 (*)    GFR calc Af Amer 58 (*)    All other components within normal limits  CBC WITH DIFFERENTIAL/PLATELET - Abnormal; Notable for the following:    WBC 11.8 (*)    RBC 3.82 (*)    Hemoglobin 12.1 (*)    HCT 34.9 (*)    Neutrophils Relative % 87 (*)    Neutro Abs 10.3 (*)    Lymphocytes Relative 9 (*)    All other components within normal limits   URINALYSIS, ROUTINE W REFLEX MICROSCOPIC (NOT AT [facility]) - Abnormal; Notable for the following:    Hgb urine dipstick TRACE (*)    All other components within normal limits  TROPONIN I - Abnormal; Notable for the following:    Troponin I 0.09 (*)    All other components within normal limits  TROPONIN I - Abnormal; Notable for the following:    Troponin I 0.06 (*)    All other components within normal limits  CULTURE, BLOOD (ROUTINE X 2)  CULTURE, BLOOD (ROUTINE X 2)  URINE CULTURE  URINE MICROSCOPIC-ADD ON  LACTIC ACID, PLASMA  I-STAT CG4 LACTIC ACID, ED    Imaging Review Dg Chest 2 View  01/26/2015   CLINICAL DATA:  Weakness for several days.  EXAM: CHEST  2 VIEW  COMPARISON:  03/21/2013  FINDINGS: Stable sclerosis along the anterior right first rib. Otherwise, the lungs are clear. Heart size is normal. Trachea is midline. Degenerative changes in thoracic spine. No large pleural effusions. Old compression deformity in the lower thoracic spine region.  IMPRESSION: No acute chest findings.   Electronically Signed   By: Markus Daft M.D.   On: 01/26/2015 10:13     EKG Interpretation   Date/Time:  Sunday January 26 2015 09:26:34 EDT Ventricular Rate:  69 PR Interval:  153 QRS Duration: 96 QT Interval:  486 QTC Calculation: 521 R Axis:   51 Text Interpretation:  Sinus rhythm Prolonged QT interval, increased since  last tracing Confirmed by Ananda Caya  MD-J, Tosh Glaze ([id number]) on 01/26/2015 9:46:30 AM      MDM   Final diagnoses:  Tick bite  Myalgia  Dehydration   Pt symptoms improved after IV fluids.  Labs are consistent with dehydration.  Pt's troponin is slightly elevated.  He denies any chest pain or shortness of breath.  Sx do not suggest cardiac etiology and the levels are decreasing.      Pt has had recent tick bites.  Will give dose of doxy and treat empirically for tick borne illness.  I personally performed the services described in this documentation, which was scribed in my  presence.  The recorded information has been reviewed and is accurate.    Dorie Rank, MD 01/26/15 [phone]

## 2015-01-26 NOTE — ED Notes (Signed)
Pt states that he feeling much better since first 2 boluses

## 2015-01-26 NOTE — Discharge Instructions (Signed)
Tick Bite Information Ticks are insects that attach themselves to the skin and draw blood for food. There are various types of ticks. Common types include wood ticks and deer ticks. Most ticks live in shrubs and grassy areas. Ticks can climb onto your body when you make contact with leaves or grass where the tick is waiting. The most common places on the body for ticks to attach themselves are the scalp, neck, armpits, waist, and groin. Most tick bites are harmless, but sometimes ticks carry germs that cause diseases. These germs can be spread to a person during the tick's feeding process. The chance of a disease spreading through a tick bite depends on:   The type of tick.  Time of year.   How long the tick is attached.   Geographic location.  HOW CAN YOU PREVENT TICK BITES? Take these steps to help prevent tick bites when you are outdoors:  Wear protective clothing. Long sleeves and long pants are best.   Wear white clothes so you can see ticks more easily.  Tuck your pant legs into your socks.   If walking on a trail, stay in the middle of the trail to avoid brushing against bushes.  Avoid walking through areas with long grass.  Put insect repellent on all exposed skin and along boot tops, pant legs, and sleeve cuffs.   Check clothing, hair, and skin repeatedly and before going inside.   Brush off any ticks that are not attached.  Take a shower or bath as soon as possible after being outdoors.  WHAT IS THE PROPER WAY TO REMOVE A TICK? Ticks should be removed as soon as possible to help prevent diseases caused by tick bites. 1. If latex gloves are available, put them on before trying to remove a tick.  2. Using fine-point tweezers, grasp the tick as close to the skin as possible. You may also use curved forceps or a tick removal tool. Grasp the tick as close to its head as possible. Avoid grasping the tick on its body. 3. Pull gently with steady upward pressure until  the tick lets go. Do not twist the tick or jerk it suddenly. This may break off the tick's head or mouth parts. 4. Do not squeeze or crush the tick's body. This could force disease-carrying fluids from the tick into your body.  5. After the tick is removed, wash the bite area and your hands with soap and water or other disinfectant such as alcohol. 6. Apply a small amount of antiseptic cream or ointment to the bite site.  7. Wash and disinfect any instruments that were used.  Do not try to remove a tick by applying a hot match, petroleum jelly, or fingernail polish to the tick. These methods do not work and may increase the chances of disease being spread from the tick bite.  WHEN SHOULD YOU SEEK MEDICAL CARE? Contact your health care provider if you are unable to remove a tick from your skin or if a part of the tick breaks off and is stuck in the skin.  After a tick bite, you need to be aware of signs and symptoms that could be related to diseases spread by ticks. Contact your health care provider if you develop any of the following in the days or weeks after the tick bite:  Unexplained fever.  Rash. A circular rash that appears days or weeks after the tick bite may indicate the possibility of Lyme disease. The rash may resemble   a target with a bull's-eye and may occur at a different part of your body than the tick bite.  Redness and swelling in the area of the tick bite.   Tender, swollen lymph glands.   Diarrhea.   Weight loss.   Cough.   Fatigue.   Muscle, joint, or bone pain.   Abdominal pain.   Headache.   Lethargy or a change in your level of consciousness.  Difficulty walking or moving your legs.   Numbness in the legs.   Paralysis.  Shortness of breath.   Confusion.   Repeated vomiting.  Document Released: 07/09/2000 Document Revised: 05/02/2013 Document Reviewed: 12/20/2012 [employer] Patient Information 2015 [employer]. This information is  not intended to replace advice given to you by your health care provider. Make sure you discuss any questions you have with your health care provider.  

## 2015-01-26 NOTE — ED Notes (Signed)
Pt complain of fever and weakness for three days

## 2015-01-26 NOTE — ED Notes (Signed)
MD at bedside. 

## 2015-01-28 LAB — URINE CULTURE

## 2015-01-31 LAB — CULTURE, BLOOD (ROUTINE X 2)
CULTURE: NO GROWTH
Culture: NO GROWTH

## 2016-05-16 ENCOUNTER — Emergency Department (HOSPITAL_COMMUNITY): Payer: Medicare Other

## 2016-05-16 ENCOUNTER — Encounter (HOSPITAL_COMMUNITY): Payer: Self-pay

## 2016-05-16 ENCOUNTER — Emergency Department (HOSPITAL_COMMUNITY)
Admission: EM | Admit: 2016-05-16 | Discharge: 2016-05-16 | Disposition: A | Discharge status: Home / Self Care | Payer: Medicare Other | Attending: Emergency Medicine | Admitting: Emergency Medicine

## 2016-05-16 DIAGNOSIS — R531 Weakness: Secondary | ICD-10-CM

## 2016-05-16 DIAGNOSIS — I10 Essential (primary) hypertension: Secondary | ICD-10-CM | POA: Insufficient documentation

## 2016-05-16 DIAGNOSIS — R519 Headache, unspecified: Secondary | ICD-10-CM

## 2016-05-16 DIAGNOSIS — R51 Headache: Secondary | ICD-10-CM | POA: Diagnosis not present

## 2016-05-16 DIAGNOSIS — Z79899 Other long term (current) drug therapy: Secondary | ICD-10-CM | POA: Diagnosis not present

## 2016-05-16 DIAGNOSIS — Z7901 Long term (current) use of anticoagulants: Secondary | ICD-10-CM | POA: Insufficient documentation

## 2016-05-16 DIAGNOSIS — F17290 Nicotine dependence, other tobacco product, uncomplicated: Secondary | ICD-10-CM | POA: Insufficient documentation

## 2016-05-16 LAB — URINALYSIS, ROUTINE W REFLEX MICROSCOPIC
Bilirubin Urine: NEGATIVE
GLUCOSE, UA: NEGATIVE mg/dL
KETONES UR: NEGATIVE mg/dL
Nitrite: NEGATIVE
Protein, ur: NEGATIVE mg/dL
Specific Gravity, Urine: 1.025 (ref 1.005–1.030)
pH: 6 (ref 5.0–8.0)

## 2016-05-16 LAB — URINE MICROSCOPIC-ADD ON

## 2016-05-16 LAB — COMPREHENSIVE METABOLIC PANEL
ALBUMIN: 4.3 g/dL (ref 3.5–5.0)
ALT: 29 U/L (ref 17–63)
ANION GAP: 8 (ref 5–15)
AST: 35 U/L (ref 15–41)
Alkaline Phosphatase: 78 U/L (ref 38–126)
BILIRUBIN TOTAL: 0.8 mg/dL (ref 0.3–1.2)
BUN: 22 mg/dL — AB (ref 6–20)
CALCIUM: 8.7 mg/dL — AB (ref 8.9–10.3)
CO2: 26 mmol/L (ref 22–32)
CREATININE: 1.07 mg/dL (ref 0.61–1.24)
Chloride: 98 mmol/L — ABNORMAL LOW (ref 101–111)
GFR calc Af Amer: >60 mL/min (ref >60)
GFR calc non Af Amer: >60 mL/min (ref >60)
GLUCOSE: 109 mg/dL — AB (ref 65–99)
Potassium: 4.7 mmol/L (ref 3.5–5.1)
Sodium: 132 mmol/L — ABNORMAL LOW (ref 135–145)
TOTAL PROTEIN: 8.1 g/dL (ref 6.5–8.1)

## 2016-05-16 LAB — CBC WITH DIFFERENTIAL/PLATELET
BASOS PCT: 0 %
Basophils Absolute: 0 10*3/uL (ref 0.0–0.1)
Eosinophils Absolute: 0 10*3/uL (ref 0.0–0.7)
Eosinophils Relative: 0 %
HCT: 38.8 % — ABNORMAL LOW (ref 39.0–52.0)
HEMOGLOBIN: 13.6 g/dL (ref 13.0–17.0)
LYMPHS ABS: 1.6 10*3/uL (ref 0.7–4.0)
Lymphocytes Relative: 12 %
MCH: 31.9 pg (ref 26.0–34.0)
MCHC: 35.1 g/dL (ref 30.0–36.0)
MCV: 90.9 fL (ref 78.0–100.0)
MONOS PCT: 7 %
Monocytes Absolute: 1 10*3/uL (ref 0.1–1.0)
NEUTROS PCT: 81 %
Neutro Abs: 10.7 10*3/uL — ABNORMAL HIGH (ref 1.7–7.7)
Platelets: 284 10*3/uL (ref 150–400)
RBC: 4.27 MIL/uL (ref 4.22–5.81)
RDW: 12 % (ref 11.5–15.5)
WBC: 13.2 10*3/uL — ABNORMAL HIGH (ref 4.0–10.5)

## 2016-05-16 MED ORDER — ACETAMINOPHEN 500 MG PO TABS
1000.0000 mg | ORAL_TABLET | Freq: Once | ORAL | Status: AC
  Administered 2016-05-16: 1000 mg via ORAL

## 2016-05-16 MED ORDER — ACETAMINOPHEN 500 MG PO TABS
1000.0000 mg | ORAL_TABLET | Freq: Once | ORAL | Status: AC
  Administered 2016-05-16: 1000 mg via ORAL
  Filled 2016-05-16: qty 2

## 2016-05-16 MED ORDER — SODIUM CHLORIDE 0.9 % IV BOLUS (SEPSIS)
1000.0000 mL | Freq: Once | INTRAVENOUS | Status: AC
  Administered 2016-05-16: 1000 mL via INTRAVENOUS

## 2016-05-16 MED ORDER — PROCHLORPERAZINE EDISYLATE 5 MG/ML IJ SOLN
5.0000 mg | Freq: Once | INTRAMUSCULAR | Status: AC
  Administered 2016-05-16: 5 mg via INTRAVENOUS
  Filled 2016-05-16: qty 2

## 2016-05-16 MED ORDER — ACETAMINOPHEN 500 MG PO TABS
ORAL_TABLET | ORAL | Status: AC
  Filled 2016-05-16: qty 2

## 2016-05-16 NOTE — Discharge Instructions (Signed)
Please follow-up with her primary care doctor closely this week for reevaluation. Return without fail for worsening symptoms, including escalating pain, confusion, fever, new vision or speech changes, do numbness or weakness, or any other symptoms concerning to you.

## 2016-05-16 NOTE — ED Notes (Signed)
Patient transported to CT 

## 2016-05-16 NOTE — ED Triage Notes (Signed)
Pt states he recently was in [place] on a hunting trip and when he came home he began to have nausea, headache, dizziness and unable to eat. States he feels like he may be dehydrated. Denies vomiting or diarrhea.

## 2016-05-16 NOTE — ED Provider Notes (Addendum)
[facility] DEPT Provider Note   CSN: [id number] Arrival date & time: 05/16/16  1028  By signing my name below, I, Nathan [name], attest that this documentation has been prepared under the direction and in the presence of Nathan Dandy, MD. Electronically Signed: Reola [name], ED Scribe. 05/16/16. 11:24 AM.  History   Chief Complaint Chief Complaint  Patient presents with  . Weakness    generalized weakness   The history is provided by the patient. No language interpreter was used.   HPI Comments: Nathan [name] is a 70 y.o. male with a PMHx of GERD, who presents to the Emergency Department complaining of gradual onset, constant occiput headache over the past several days. He rates his current headache pain as a 4/10. Pt reports associated chills, deacreased appetite, diffuse weakness, nausea, gait instability, and a mild dry cough secondary to his symptoms. He notes that ~1 week prior to the onset of his symptoms that he was on a hunting trip in [place]; however, denies and significant suspicious exposures during this trip or otherwise. No sick contacts with similar symptoms. Pt has been intermittently taking Tylenol since the onset of his symptoms with minimal relief. Pt has had decreased PO intake since the onset of his symptoms. Denies vomiting, diarrhea, congestion, rhinorrhea, fever, abdominal pain, unexpected weight changes, double vision, speech difficulties, rash, focal numbness/weakness, or any other associated symptoms.   Past Medical History:  Diagnosis Date  . GERD (gastroesophageal reflux disease)   . HTN (hypertension)   . Hyperlipidemia   . PTSD (post-traumatic stress disorder)   . Renal disorder   . Seizure [facility])    Patient Active Problem List   Diagnosis Date Noted  . Hypokalemia 03/22/2013  . Sepsis ([place]) 03/21/2013  . Thrombocytopenia ([place]) 03/21/2013  . Acidosis 03/21/2013  . Tachycardia 03/21/2013  . UTI (lower urinary tract infection)  03/21/2013  . Hyponatremia 03/21/2013  . Seizure ([place])   . PTSD (post-traumatic stress disorder)   . HTN (hypertension)   . GERD (gastroesophageal reflux disease)    History reviewed. No pertinent surgical history.  Home Medications    Prior to Admission medications   Medication Sig Start Date End Date Taking? Authorizing Provider  ARIPiprazole (ABILIFY) 20 MG tablet Take 20 mg by mouth daily.    Yes Historical Provider, MD  cetirizine (ZYRTEC) 10 MG tablet Take 10 mg by mouth daily.   Yes Historical Provider, MD  diazepam (VALIUM) 5 MG tablet Take 5 mg by mouth daily.    Yes Historical Provider, MD  lisinopril (PRINIVIL,ZESTRIL) 40 MG tablet Take 40 mg by mouth every morning.   Yes Historical Provider, MD  meloxicam (MOBIC) 15 MG tablet Take 15 mg by mouth at bedtime.   Yes Historical Provider, MD  metoprolol succinate (TOPROL-XL) 25 MG 24 hr tablet Take 25 mg by mouth 2 (two) times daily.    Yes Historical Provider, MD  Multiple Vitamin (MULTIVITAMIN WITH MINERALS) TABS tablet Take 1 tablet by mouth at bedtime.    Yes Historical Provider, MD  omeprazole (PRILOSEC) 20 MG capsule Take 20 mg by mouth daily.   Yes Historical Provider, MD  phenytoin (DILANTIN) 100 MG ER capsule Take 100 mg by mouth 5 (five) times daily.    Yes Historical Provider, MD  simvastatin (ZOCOR) 40 MG tablet Take 20 mg by mouth at bedtime.    Yes Historical Provider, MD  tamsulosin (FLOMAX) 0.4 MG CAPS capsule Take 0.4 mg by mouth at bedtime.    Yes  Historical Provider, MD  venlafaxine XR (EFFEXOR-XR) 150 MG 24 hr capsule Take 150 mg by mouth at bedtime.    Yes Historical Provider, MD  warfarin (COUMADIN) 5 MG tablet Take 5-7.5 mg by mouth See admin instructions. Take 5 mg daily, except take 7.5 mg on Mondays.   Yes Historical Provider, MD  zolpidem (AMBIEN) 10 MG tablet Take 10 mg by mouth at bedtime as needed for sleep. Max 3-5 tablets per week.   Yes Historical Provider, MD  doxycycline (VIBRAMYCIN) 100 MG capsule  Take 1 capsule (100 mg total) by mouth 2 (two) times daily. Patient not taking: Reported on 05/16/2016 01/26/15   Dorie Rank, MD   Family History No family history on file.  Social History Social History  Substance Use Topics  . Smoking status: Current Some Day Smoker    Types: Pipe  . Smokeless tobacco: Never Used  . Alcohol use No   Allergies   Terazosin  Review of Systems Review of Systems 10/14 systems reviewed and are negative other than those stated in the HPI  Physical Exam Updated Vital Signs BP 136/69 (BP Location: Right Arm)   Pulse 87   Temp 100.9 F (38.3 C) (Oral)   Resp 17   SpO2 100%   Physical Exam Physical Exam  Nursing note and vitals reviewed. Constitutional: Well developed, well nourished, non-toxic, and in no acute distress Head: Normocephalic and atraumatic.  Mouth/Throat: Oropharynx is clear and moist.  Neck: Normal range of motion. Neck supple. No meningismus. Cardiovascular: Normal rate and regular rhythm.   Pulmonary/Chest: Effort normal and breath sounds normal.  Abdominal: Soft. There is no tenderness. There is no rebound and no guarding.  Musculoskeletal: Normal range of motion.  Neurological: Alert, no facial droop, fluent speech, moves all extremities symmetrically Skin: Skin is warm and dry.  Psychiatric: Cooperative  Neurological:  Alert, oriented to person, place, time, and situation. Memory grossly in tact. Fluent speech. No dysarthria or aphasia.  Cranial nerves: VF are full. Pupils are symmetric, and reactive to light. EOMI without nystagmus. No gaze deviation. Facial muscles symmetric with activation. Sensation to light touch over face in tact bilaterally. Hearing grossly in tact. Palate elevates symmetrically. Head turn and shoulder shrug are intact. Tongue midline.  Reflexes defered.  Muscle bulk and tone normal. No pronator drift. Moves all extremities symmetrically. Sensation to light touch is in tact throughout in bilateral  upper and lower extremities. Coordination reveals no dysmetria with finger to nose. Gait is narrow-based and steady. Non-ataxic.  ED Treatments / Results  DIAGNOSTIC STUDIES: Oxygen Saturation is 98% on RA, normal by my interpretation.   COORDINATION OF CARE: 11:24 AM-Discussed next steps with pt. Pt verbalized understanding and is agreeable with the plan.   Labs (all labs ordered are listed, but only abnormal results are displayed) Labs Reviewed  CBC WITH DIFFERENTIAL/PLATELET - Abnormal; Notable for the following:       Result Value   WBC 13.2 (*)    HCT 38.8 (*)    Neutro Abs 10.7 (*)    All other components within normal limits  COMPREHENSIVE METABOLIC PANEL - Abnormal; Notable for the following:    Sodium 132 (*)    Chloride 98 (*)    Glucose, Bld 109 (*)    BUN 22 (*)    Calcium 8.7 (*)    All other components within normal limits  URINALYSIS, ROUTINE W REFLEX MICROSCOPIC (NOT AT [facility]) - Abnormal; Notable for the following:    APPearance HAZY (*)  Hgb urine dipstick TRACE (*)    Leukocytes, UA TRACE (*)    All other components within normal limits  URINE MICROSCOPIC-ADD ON - Abnormal; Notable for the following:    Squamous Epithelial / LPF 0-5 (*)    Bacteria, UA FEW (*)    Casts HYALINE CASTS (*)    All other components within normal limits  URINE CULTURE    EKG  EKG Interpretation None      Radiology Dg Chest 2 View  Result Date: 05/16/2016 CLINICAL DATA:  Pt states he recently was in [place] on a hunting trip and when he came home he began to have nausea, headache, dizziness and unable to eat, EXAM: CHEST  2 VIEW COMPARISON:  01/26/2015 FINDINGS: Cardiomediastinal silhouette is within normal limits. The lungs free of focal consolidations and pleural effusions. No pulmonary edema. Degenerative changes are seen in the mid and lower thoracic spine. IMPRESSION: No evidence for acute cardio pulmonary abnormality. Electronically Signed   By: Nolon Nations  M.D.   On: 05/16/2016 12:29   Ct Head Wo Contrast  Result Date: 05/16/2016 CLINICAL DATA:  posterior headache, with generalized weakness, decreased appetite and cough. Hx of seizures, htn EXAM: CT HEAD WITHOUT CONTRAST TECHNIQUE: Contiguous axial images were obtained from the base of the skull through the vertex without intravenous contrast. COMPARISON:  03/21/2013 FINDINGS: Brain: Mild periventricular white matter changes are consistent with small vessel disease. There is no intra or extra-axial fluid collection or mass lesion. The basilar cisterns and ventricles have a normal appearance. There is no CT evidence for acute infarction or hemorrhage. Vascular: There is atherosclerotic calcification of the internal carotid arteries. Skull: Normal. Negative for fracture or focal lesion. Sinuses/Orbits: No acute finding. Other: None IMPRESSION: 1. Small vessel disease. 2.  No evidence for acute intracranial abnormality. Electronically Signed   By: Nolon Nations M.D.   On: 05/16/2016 11:53    Procedures Procedures   Medications Ordered in ED Medications  sodium chloride 0.9 % bolus 1,000 mL (0 mLs Intravenous Stopped 05/16/16 1355)  prochlorperazine (COMPAZINE) injection 5 mg (5 mg Intravenous Given 05/16/16 1220)  acetaminophen (TYLENOL) tablet 1,000 mg (1,000 mg Oral Given 05/16/16 1201)  acetaminophen (TYLENOL) tablet 1,000 mg (1,000 mg Oral Given 05/16/16 1419)    Initial Impression / Assessment and Plan / ED Course  I have reviewed the triage vital signs and the nursing notes.  Pertinent labs & imaging results that were available during my care of the patient were reviewed by me and considered in my medical decision making (see chart for details).  Clinical Course   70 year old male who presents with generalized weakness and headache. He is non-toxic and in no acute distress. In ED, does have fever 100.67F. No tachycardia, hypotension or increased respiratory effort. No other major  systemic signs/symptoms of sepsis. UA without clear UTI and CXR visualized showing no infiltrate or other acute processes. He is neuro in tact with normal CT head. No meningismus. My suspicion for meningitis at this time is low, and I do not think he requires LP at this time. He does have mild leukocytosis of 13, but no evidence of bacterial infection at this time. Question early viral illness that can cause symptoms. After IVF, tylenol and compazine, he does feel significantly better and requests discharge. I do not think he requires admission for fever w/u at this time. He is not immune compromised.  Will continue symptomatic treatment at home with close PCP follow-up. I did discuss  strict return instructions what would warrant further work-up. He expressed understanding of all discharge instructions, and felt comfortable with the plan of care.  Final Clinical Impressions(s) / ED Diagnoses   Final diagnoses:  Weakness  Nonintractable headache, unspecified chronicity pattern, unspecified headache type   New Prescriptions Discharge Medication List as of 05/16/2016  2:03 PM     I personally performed the services described in this documentation, which was scribed in my presence. The recorded information has been reviewed and is accurate.     Nathan Dandy, MD 05/16/16 [place] Ladarrell Cornwall, MD 05/16/16 [phone]

## 2016-05-17 LAB — URINE CULTURE

## 2016-05-18 ENCOUNTER — Emergency Department (HOSPITAL_COMMUNITY): Payer: Non-veteran care

## 2016-05-18 ENCOUNTER — Encounter (HOSPITAL_COMMUNITY): Payer: Self-pay

## 2016-05-18 ENCOUNTER — Inpatient Hospital Stay (HOSPITAL_COMMUNITY)
Admission: EM | Admit: 2016-05-18 | Discharge: 2016-05-23 | DRG: 075 | Disposition: A | Discharge status: Home / Self Care | Payer: Non-veteran care | Attending: Family Medicine | Admitting: Family Medicine

## 2016-05-18 DIAGNOSIS — Z86718 Personal history of other venous thrombosis and embolism: Secondary | ICD-10-CM | POA: Diagnosis not present

## 2016-05-18 DIAGNOSIS — A879 Viral meningitis, unspecified: Secondary | ICD-10-CM | POA: Diagnosis present

## 2016-05-18 DIAGNOSIS — F4310 Post-traumatic stress disorder, unspecified: Secondary | ICD-10-CM | POA: Diagnosis present

## 2016-05-18 DIAGNOSIS — I639 Cerebral infarction, unspecified: Secondary | ICD-10-CM | POA: Diagnosis present

## 2016-05-18 DIAGNOSIS — Z23 Encounter for immunization: Secondary | ICD-10-CM

## 2016-05-18 DIAGNOSIS — R32 Unspecified urinary incontinence: Secondary | ICD-10-CM | POA: Diagnosis present

## 2016-05-18 DIAGNOSIS — G459 Transient cerebral ischemic attack, unspecified: Secondary | ICD-10-CM

## 2016-05-18 DIAGNOSIS — R291 Meningismus: Secondary | ICD-10-CM

## 2016-05-18 DIAGNOSIS — I10 Essential (primary) hypertension: Secondary | ICD-10-CM | POA: Diagnosis present

## 2016-05-18 DIAGNOSIS — F17290 Nicotine dependence, other tobacco product, uncomplicated: Secondary | ICD-10-CM | POA: Diagnosis present

## 2016-05-18 DIAGNOSIS — E871 Hypo-osmolality and hyponatremia: Secondary | ICD-10-CM | POA: Diagnosis present

## 2016-05-18 DIAGNOSIS — R41841 Cognitive communication deficit: Secondary | ICD-10-CM

## 2016-05-18 DIAGNOSIS — R509 Fever, unspecified: Secondary | ICD-10-CM | POA: Diagnosis present

## 2016-05-18 DIAGNOSIS — G030 Nonpyogenic meningitis: Secondary | ICD-10-CM | POA: Diagnosis not present

## 2016-05-18 DIAGNOSIS — R519 Headache, unspecified: Secondary | ICD-10-CM

## 2016-05-18 DIAGNOSIS — Z7901 Long term (current) use of anticoagulants: Secondary | ICD-10-CM

## 2016-05-18 DIAGNOSIS — T45515A Adverse effect of anticoagulants, initial encounter: Secondary | ICD-10-CM | POA: Diagnosis present

## 2016-05-18 DIAGNOSIS — E785 Hyperlipidemia, unspecified: Secondary | ICD-10-CM | POA: Diagnosis present

## 2016-05-18 DIAGNOSIS — R7881 Bacteremia: Secondary | ICD-10-CM | POA: Diagnosis not present

## 2016-05-18 DIAGNOSIS — R4789 Other speech disturbances: Secondary | ICD-10-CM | POA: Diagnosis not present

## 2016-05-18 DIAGNOSIS — G40909 Epilepsy, unspecified, not intractable, without status epilepticus: Secondary | ICD-10-CM | POA: Diagnosis present

## 2016-05-18 DIAGNOSIS — D72829 Elevated white blood cell count, unspecified: Secondary | ICD-10-CM

## 2016-05-18 DIAGNOSIS — M6281 Muscle weakness (generalized): Secondary | ICD-10-CM

## 2016-05-18 DIAGNOSIS — G458 Other transient cerebral ischemic attacks and related syndromes: Secondary | ICD-10-CM

## 2016-05-18 DIAGNOSIS — R51 Headache: Secondary | ICD-10-CM

## 2016-05-18 DIAGNOSIS — K219 Gastro-esophageal reflux disease without esophagitis: Secondary | ICD-10-CM | POA: Diagnosis present

## 2016-05-18 LAB — CBC WITH DIFFERENTIAL/PLATELET
Basophils Absolute: 0 10*3/uL (ref 0.0–0.1)
Basophils Relative: 0 %
Eosinophils Absolute: 0 10*3/uL (ref 0.0–0.7)
Eosinophils Relative: 0 %
HEMATOCRIT: 37 % — AB (ref 39.0–52.0)
Hemoglobin: 13.2 g/dL (ref 13.0–17.0)
LYMPHS ABS: 2.1 10*3/uL (ref 0.7–4.0)
LYMPHS PCT: 16 %
MCH: 31.9 pg (ref 26.0–34.0)
MCHC: 35.7 g/dL (ref 30.0–36.0)
MCV: 89.4 fL (ref 78.0–100.0)
MONOS PCT: 10 %
Monocytes Absolute: 1.4 10*3/uL — ABNORMAL HIGH (ref 0.1–1.0)
NEUTROS ABS: 10.1 10*3/uL — AB (ref 1.7–7.7)
NEUTROS PCT: 74 %
Platelets: 265 10*3/uL (ref 150–400)
RBC: 4.14 MIL/uL — ABNORMAL LOW (ref 4.22–5.81)
RDW: 12 % (ref 11.5–15.5)
WBC: 13.6 10*3/uL — ABNORMAL HIGH (ref 4.0–10.5)

## 2016-05-18 LAB — URINALYSIS, ROUTINE W REFLEX MICROSCOPIC
Bilirubin Urine: NEGATIVE
Glucose, UA: NEGATIVE mg/dL
Leukocytes, UA: NEGATIVE
Nitrite: NEGATIVE
PH: 6 (ref 5.0–8.0)
Protein, ur: NEGATIVE mg/dL
SPECIFIC GRAVITY, URINE: 1.02 (ref 1.005–1.030)

## 2016-05-18 LAB — TYPE AND SCREEN
ABO/RH(D): AB POS
ANTIBODY SCREEN: NEGATIVE

## 2016-05-18 LAB — URINE MICROSCOPIC-ADD ON

## 2016-05-18 LAB — COMPREHENSIVE METABOLIC PANEL
ALK PHOS: 68 U/L (ref 38–126)
ALT: 35 U/L (ref 17–63)
ANION GAP: 8 (ref 5–15)
AST: 49 U/L — ABNORMAL HIGH (ref 15–41)
Albumin: 4.1 g/dL (ref 3.5–5.0)
BILIRUBIN TOTAL: 0.9 mg/dL (ref 0.3–1.2)
BUN: 19 mg/dL (ref 6–20)
CALCIUM: 8.5 mg/dL — AB (ref 8.9–10.3)
CO2: 26 mmol/L (ref 22–32)
Chloride: 94 mmol/L — ABNORMAL LOW (ref 101–111)
Creatinine, Ser: 1.05 mg/dL (ref 0.61–1.24)
GFR calc Af Amer: >60 mL/min (ref >60)
GFR calc non Af Amer: >60 mL/min (ref >60)
GLUCOSE: 106 mg/dL — AB (ref 65–99)
Potassium: 3.9 mmol/L (ref 3.5–5.1)
Sodium: 128 mmol/L — ABNORMAL LOW (ref 135–145)
TOTAL PROTEIN: 7.9 g/dL (ref 6.5–8.1)

## 2016-05-18 LAB — INFLUENZA PANEL BY PCR (TYPE A & B)
H1N1 flu by pcr: NOT DETECTED
INFLAPCR: NEGATIVE
INFLBPCR: NEGATIVE

## 2016-05-18 LAB — LACTIC ACID, PLASMA
Lactic Acid, Venous: 1.3 mmol/L (ref 0.5–1.9)
Lactic Acid, Venous: 2.1 mmol/L (ref 0.5–1.9)

## 2016-05-18 LAB — PROTIME-INR
INR: 2.17
Prothrombin Time: 24.5 seconds — ABNORMAL HIGH (ref 11.4–15.2)

## 2016-05-18 LAB — PHENYTOIN LEVEL, TOTAL: Phenytoin Lvl: 11.7 ug/mL (ref 10.0–20.0)

## 2016-05-18 LAB — I-STAT CG4 LACTIC ACID, ED: Lactic Acid, Venous: 1.04 mmol/L (ref 0.5–1.9)

## 2016-05-18 MED ORDER — SODIUM CHLORIDE 0.9 % IV SOLN
Freq: Once | INTRAVENOUS | Status: AC
  Administered 2016-05-19: 01:00:00 via INTRAVENOUS

## 2016-05-18 MED ORDER — SODIUM CHLORIDE 0.9 % IV SOLN
INTRAVENOUS | Status: AC
  Administered 2016-05-18: 16:00:00 via INTRAVENOUS

## 2016-05-18 MED ORDER — SIMVASTATIN 20 MG PO TABS
20.0000 mg | ORAL_TABLET | Freq: Every day | ORAL | Status: DC
  Administered 2016-05-18 – 2016-05-22 (×5): 20 mg via ORAL
  Filled 2016-05-18 (×5): qty 1

## 2016-05-18 MED ORDER — VITAMIN K1 10 MG/ML IJ SOLN
INTRAMUSCULAR | Status: AC
  Filled 2016-05-18: qty 1

## 2016-05-18 MED ORDER — SODIUM CHLORIDE 0.9 % IV SOLN
INTRAVENOUS | Status: DC
  Administered 2016-05-18 – 2016-05-20 (×3): via INTRAVENOUS

## 2016-05-18 MED ORDER — VANCOMYCIN HCL IN DEXTROSE 1-5 GM/200ML-% IV SOLN
1000.0000 mg | Freq: Once | INTRAVENOUS | Status: AC
  Administered 2016-05-18: 1000 mg via INTRAVENOUS
  Filled 2016-05-18: qty 200

## 2016-05-18 MED ORDER — ADULT MULTIVITAMIN W/MINERALS CH
1.0000 | ORAL_TABLET | Freq: Every day | ORAL | Status: DC
  Administered 2016-05-18 – 2016-05-22 (×5): 1 via ORAL
  Filled 2016-05-18 (×5): qty 1

## 2016-05-18 MED ORDER — SENNOSIDES-DOCUSATE SODIUM 8.6-50 MG PO TABS: 1.0000 | ORAL_TABLET | Freq: Every evening | ORAL | Status: DC | PRN

## 2016-05-18 MED ORDER — PANTOPRAZOLE SODIUM 40 MG PO TBEC
40.0000 mg | DELAYED_RELEASE_TABLET | Freq: Every day | ORAL | Status: DC
  Administered 2016-05-18 – 2016-05-23 (×6): 40 mg via ORAL
  Filled 2016-05-18 (×6): qty 1

## 2016-05-18 MED ORDER — INFLUENZA VAC SPLIT QUAD 0.5 ML IM SUSY: 0.5000 mL | PREFILLED_SYRINGE | INTRAMUSCULAR | Status: DC

## 2016-05-18 MED ORDER — ACETAMINOPHEN 325 MG PO TABS
650.0000 mg | ORAL_TABLET | ORAL | Status: DC | PRN
  Administered 2016-05-18 – 2016-05-21 (×7): 650 mg via ORAL
  Filled 2016-05-18 (×7): qty 2

## 2016-05-18 MED ORDER — LISINOPRIL 10 MG PO TABS
40.0000 mg | ORAL_TABLET | Freq: Every morning | ORAL | Status: DC
  Administered 2016-05-19 – 2016-05-23 (×5): 40 mg via ORAL
  Filled 2016-05-18 (×6): qty 4

## 2016-05-18 MED ORDER — ARIPIPRAZOLE 10 MG PO TABS
20.0000 mg | ORAL_TABLET | Freq: Every day | ORAL | Status: DC
  Administered 2016-05-18 – 2016-05-23 (×6): 20 mg via ORAL
  Filled 2016-05-18 (×6): qty 2

## 2016-05-18 MED ORDER — ONDANSETRON HCL 4 MG PO TABS: 4.0000 mg | ORAL_TABLET | Freq: Four times a day (QID) | ORAL | Status: DC | PRN

## 2016-05-18 MED ORDER — ACETAMINOPHEN 650 MG RE SUPP: 650.0000 mg | Freq: Four times a day (QID) | RECTAL | Status: DC | PRN

## 2016-05-18 MED ORDER — ONDANSETRON HCL 4 MG/2ML IJ SOLN: 4.0000 mg | Freq: Four times a day (QID) | INTRAMUSCULAR | Status: DC | PRN

## 2016-05-18 MED ORDER — TAMSULOSIN HCL 0.4 MG PO CAPS
0.4000 mg | ORAL_CAPSULE | Freq: Every day | ORAL | Status: DC
  Administered 2016-05-18 – 2016-05-22 (×5): 0.4 mg via ORAL
  Filled 2016-05-18 (×5): qty 1

## 2016-05-18 MED ORDER — PHENYTOIN SODIUM EXTENDED 100 MG PO CAPS
100.0000 mg | ORAL_CAPSULE | Freq: Every day | ORAL | Status: DC
  Administered 2016-05-18 – 2016-05-23 (×24): 100 mg via ORAL
  Filled 2016-05-18 (×24): qty 1

## 2016-05-18 MED ORDER — SODIUM CHLORIDE 0.9 % IV BOLUS (SEPSIS)
250.0000 mL | Freq: Once | INTRAVENOUS | Status: AC
  Administered 2016-05-18: 250 mL via INTRAVENOUS

## 2016-05-18 MED ORDER — ACETAMINOPHEN 325 MG PO TABS
650.0000 mg | ORAL_TABLET | Freq: Four times a day (QID) | ORAL | Status: DC | PRN
  Administered 2016-05-18: 650 mg via ORAL
  Filled 2016-05-18: qty 2

## 2016-05-18 MED ORDER — DIAZEPAM 5 MG PO TABS
5.0000 mg | ORAL_TABLET | Freq: Every day | ORAL | Status: DC
  Administered 2016-05-18 – 2016-05-23 (×6): 5 mg via ORAL
  Filled 2016-05-18 (×6): qty 1

## 2016-05-18 MED ORDER — DEXTROSE 5 % IV SOLN
830.0000 mg | Freq: Three times a day (TID) | INTRAVENOUS | Status: DC
  Administered 2016-05-18: 830 mg via INTRAVENOUS
  Filled 2016-05-18 (×4): qty 16.6

## 2016-05-18 MED ORDER — VITAMIN K1 10 MG/ML IJ SOLN
5.0000 mg | Freq: Once | INTRAMUSCULAR | Status: AC
  Administered 2016-05-18: 5 mg via INTRAVENOUS
  Filled 2016-05-18: qty 0.5

## 2016-05-18 MED ORDER — DEXTROSE 5 % IV SOLN
2.0000 g | Freq: Two times a day (BID) | INTRAVENOUS | Status: DC
  Administered 2016-05-18: 2 g via INTRAVENOUS
  Filled 2016-05-18 (×3): qty 2

## 2016-05-18 MED ORDER — LORATADINE 10 MG PO TABS
10.0000 mg | ORAL_TABLET | Freq: Every day | ORAL | Status: DC
  Administered 2016-05-18 – 2016-05-23 (×6): 10 mg via ORAL
  Filled 2016-05-18 (×6): qty 1

## 2016-05-18 MED ORDER — VENLAFAXINE HCL ER 75 MG PO CP24
150.0000 mg | ORAL_CAPSULE | Freq: Every day | ORAL | Status: DC
Start: 2016-05-18 — End: 2016-05-23
  Administered 2016-05-18 – 2016-05-22 (×5): 150 mg via ORAL
  Filled 2016-05-18 (×5): qty 2

## 2016-05-18 MED ORDER — PNEUMOCOCCAL VAC POLYVALENT 25 MCG/0.5ML IJ INJ: 0.5000 mL | INJECTION | INTRAMUSCULAR | Status: DC

## 2016-05-18 MED ORDER — SODIUM CHLORIDE 0.9 % IV BOLUS (SEPSIS)
1000.0000 mL | Freq: Once | INTRAVENOUS | Status: AC
  Administered 2016-05-18: 1000 mL via INTRAVENOUS

## 2016-05-18 MED ORDER — ENSURE ENLIVE PO LIQD
237.0000 mL | Freq: Two times a day (BID) | ORAL | Status: DC
  Administered 2016-05-18: 237 mL via ORAL

## 2016-05-18 MED ORDER — ACETAMINOPHEN 650 MG RE SUPP: 650.0000 mg | RECTAL | Status: DC | PRN

## 2016-05-18 MED ORDER — METOPROLOL SUCCINATE ER 25 MG PO TB24
25.0000 mg | ORAL_TABLET | Freq: Two times a day (BID) | ORAL | Status: DC
  Administered 2016-05-18 – 2016-05-23 (×10): 25 mg via ORAL
  Filled 2016-05-18 (×11): qty 1

## 2016-05-18 NOTE — ED Notes (Signed)
Primary RN attempted IV access x 2, unsuccessful both times.

## 2016-05-18 NOTE — Progress Notes (Addendum)
Pharmacy Antibiotic Note  Nathan [name] is a 70 y.o. male admitted on 05/18/2016 with meningitis.  Pharmacy has been consulted for Ceftriaxone and acyclovir dosing.  Plan: Acyclovir 830 mg IV q8 hours Ceftriaxone 2 GM IV every 12 hours F/u renal function, cultures and clinical course  Height: 5\' 11"  (180.3 cm) Weight: 208 lb (94.3 kg) IBW/kg (Calculated) : 75.3  Temp (24hrs), Avg:101.1 F (38.4 C), Min:101.1 F (38.4 C), Max:101.1 F (38.4 C)   Recent Labs Lab 05/16/16 1125 05/18/16 1111 05/18/16 1119  WBC 13.2* 13.6*  --   CREATININE 1.07 1.05  --   LATICACIDVEN  --   --  1.04    Estimated Creatinine Clearance: 75.7 mL/min (by C-G formula based on SCr of 1.05 mg/dL).    Allergies  Allergen Reactions  . Terazosin Other (See Comments)    Syncope    Antimicrobials this admission: Ceftriaxone  10/24 >>  Acyclovir  10/24>>     Dose adjustments this admission:    Thank you for allowing pharmacy to be a part of this patient's care.  Excell Seltzer, PharmD 05/18/2016 12:34 PM

## 2016-05-18 NOTE — ED Provider Notes (Signed)
[facility] DEPT Provider Note   CSN: [id number] Arrival date & time: 05/18/16  0940  By signing my name below, I, Sonum Patel, attest that this documentation has been prepared under the direction and in the presence of Ezequiel Essex, MD. Electronically Signed: Sonum Patel, Education administrator. 05/18/16. 10:23 AM.  History   Chief Complaint Chief Complaint  Patient presents with  . Fever    The history is provided by the patient. No language interpreter was used.     HPI Comments: Nathan [name] is a 70 y.o. male who presents to the Emergency Department complaining of intermittent chills and fevers with associated generalized myalgias and decreased appetite for the past 10 days. He also complains of generalized upper body shaking, HA, urine incontinence, diarrhea, and disorientation. He reports going on a hunting/camping trip to [place] 2 weeks ago after which his symptoms started. He was seen on 05/16/16 for similar symptoms. He denies back pain, chest pain, rash, insect bites, vomiting. He denies history of COPD and does not smoke. He regularly takes coumadin for history of blood clots.   Past Medical History:  Diagnosis Date  . GERD (gastroesophageal reflux disease)   . HTN (hypertension)   . Hyperlipidemia   . PTSD (post-traumatic stress disorder)   . Renal disorder   . Seizure [facility])     Patient Active Problem List   Diagnosis Date Noted  . Hypokalemia 03/22/2013  . Sepsis ([place]) 03/21/2013  . Thrombocytopenia ([place]) 03/21/2013  . Acidosis 03/21/2013  . Tachycardia 03/21/2013  . UTI (lower urinary tract infection) 03/21/2013  . Hyponatremia 03/21/2013  . Seizure ([place])   . PTSD (post-traumatic stress disorder)   . HTN (hypertension)   . GERD (gastroesophageal reflux disease)     Past Surgical History:  Procedure Laterality Date  . JOINT REPLACEMENT     Left knee, R shoulder       Home Medications    Prior to Admission medications   Medication Sig Start Date End Date  Taking? Authorizing Provider  ARIPiprazole (ABILIFY) 20 MG tablet Take 20 mg by mouth daily.     Historical Provider, MD  cetirizine (ZYRTEC) 10 MG tablet Take 10 mg by mouth daily.    Historical Provider, MD  diazepam (VALIUM) 5 MG tablet Take 5 mg by mouth daily.     Historical Provider, MD  doxycycline (VIBRAMYCIN) 100 MG capsule Take 1 capsule (100 mg total) by mouth 2 (two) times daily. Patient not taking: Reported on 05/16/2016 01/26/15   Dorie Rank, MD  lisinopril (PRINIVIL,ZESTRIL) 40 MG tablet Take 40 mg by mouth every morning.    Historical Provider, MD  meloxicam (MOBIC) 15 MG tablet Take 15 mg by mouth at bedtime.    Historical Provider, MD  metoprolol succinate (TOPROL-XL) 25 MG 24 hr tablet Take 25 mg by mouth 2 (two) times daily.     Historical Provider, MD  Multiple Vitamin (MULTIVITAMIN WITH MINERALS) TABS tablet Take 1 tablet by mouth at bedtime.     Historical Provider, MD  omeprazole (PRILOSEC) 20 MG capsule Take 20 mg by mouth daily.    Historical Provider, MD  phenytoin (DILANTIN) 100 MG ER capsule Take 100 mg by mouth 5 (five) times daily.     Historical Provider, MD  simvastatin (ZOCOR) 40 MG tablet Take 20 mg by mouth at bedtime.     Historical Provider, MD  tamsulosin (FLOMAX) 0.4 MG CAPS capsule Take 0.4 mg by mouth at bedtime.     Historical Provider, MD  venlafaxine  XR (EFFEXOR-XR) 150 MG 24 hr capsule Take 150 mg by mouth at bedtime.     Historical Provider, MD  warfarin (COUMADIN) 5 MG tablet Take 5-7.5 mg by mouth See admin instructions. Take 5 mg daily, except take 7.5 mg on Mondays.    Historical Provider, MD  zolpidem (AMBIEN) 10 MG tablet Take 10 mg by mouth at bedtime as needed for sleep. Max 3-5 tablets per week.    Historical Provider, MD    Family History No family history on file.  Social History Social History  Substance Use Topics  . Smoking status: Current Some Day Smoker    Types: Pipe  . Smokeless tobacco: Never Used  . Alcohol use No      Allergies   Terazosin   Review of Systems Review of Systems A complete 10 system review of systems was obtained and all systems are negative except as noted in the HPI and PMH.    Physical Exam Updated Vital Signs BP 153/97 (BP Location: Left Arm)   Pulse 90   Temp 101.1 F (38.4 C) (Oral)   Resp 20   Ht 5\' 11"  (1.803 m)   Wt 208 lb (94.3 kg)   BMI 29.01 kg/m   Physical Exam  Constitutional: He is oriented to person, place, and time. He appears well-developed and well-nourished. No distress.  Ill appearing, shaking chills  HENT:  Head: Normocephalic and atraumatic.  Mouth/Throat: Oropharynx is clear and moist. Mucous membranes are dry. No oropharyngeal exudate.  Eyes: Conjunctivae and EOM are normal. Pupils are equal, round, and reactive to light.  Neck: Normal range of motion. Neck supple.  No meningismus.  Cardiovascular: Normal rate, regular rhythm, normal heart sounds and intact distal pulses.   No murmur heard. Pulmonary/Chest: Effort normal and breath sounds normal. No respiratory distress.  Abdominal: Soft. There is no tenderness. There is no rebound and no guarding.  Musculoskeletal: Normal range of motion. He exhibits no edema or tenderness.  Neurological: He is alert and oriented to person, place, and time. No cranial nerve deficit. He exhibits normal muscle tone. Coordination normal.  No ataxia on finger to nose bilaterally. No pronator drift. 5/5 strength throughout. CN 2-12 intact.Equal grip strength. Sensation intact.  Mild confusion. Some word finding difficulty.  Skin: Skin is warm. No rash noted.  Psychiatric: He has a normal mood and affect. His behavior is normal.  Nursing note and vitals reviewed.    ED Treatments / Results  COORDINATION OF CARE: 10:30 AM Discussed treatment plan with pt at bedside and pt agreed to plan.    Labs (all labs ordered are listed, but only abnormal results are displayed) Labs Reviewed  CBC WITH  DIFFERENTIAL/PLATELET - Abnormal; Notable for the following:       Result Value   WBC 13.6 (*)    RBC 4.14 (*)    HCT 37.0 (*)    Neutro Abs 10.1 (*)    Monocytes Absolute 1.4 (*)    All other components within normal limits  COMPREHENSIVE METABOLIC PANEL - Abnormal; Notable for the following:    Sodium 128 (*)    Chloride 94 (*)    Glucose, Bld 106 (*)    Calcium 8.5 (*)    AST 49 (*)    All other components within normal limits  PROTIME-INR - Abnormal; Notable for the following:    Prothrombin Time 24.5 (*)    All other components within normal limits  URINALYSIS, ROUTINE W REFLEX MICROSCOPIC (NOT AT [facility]) -  Abnormal; Notable for the following:    Hgb urine dipstick MODERATE (*)    Ketones, ur TRACE (*)    All other components within normal limits  LACTIC ACID, PLASMA - Abnormal; Notable for the following:    Lactic Acid, Venous 2.1 (*)    All other components within normal limits  URINE MICROSCOPIC-ADD ON - Abnormal; Notable for the following:    Squamous Epithelial / LPF 0-5 (*)    Bacteria, UA FEW (*)    All other components within normal limits  CULTURE, BLOOD (ROUTINE X 2)  CULTURE, BLOOD (ROUTINE X 2)  URINE CULTURE  PHENYTOIN LEVEL, TOTAL  INFLUENZA PANEL BY PCR (TYPE A & B, H1N1)  LACTIC ACID, PLASMA  ROCKY MTN SPOTTED FVR ABS PNL(IGG+IGM)  B. BURGDORFI ANTIBODIES  COMPREHENSIVE METABOLIC PANEL  CBC  I-STAT CG4 LACTIC ACID, ED  PREPARE FRESH FROZEN PLASMA    EKG  EKG Interpretation None       Radiology Dg Chest 2 View  Result Date: 05/16/2016 CLINICAL DATA:  Pt states he recently was in [place] on a hunting trip and when he came home he began to have nausea, headache, dizziness and unable to eat, EXAM: CHEST  2 VIEW COMPARISON:  01/26/2015 FINDINGS: Cardiomediastinal silhouette is within normal limits. The lungs free of focal consolidations and pleural effusions. No pulmonary edema. Degenerative changes are seen in the mid and lower thoracic spine.  IMPRESSION: No evidence for acute cardio pulmonary abnormality. Electronically Signed   By: Nolon Nations M.D.   On: 05/16/2016 12:29   Ct Head Wo Contrast  Result Date: 05/16/2016 CLINICAL DATA:  posterior headache, with generalized weakness, decreased appetite and cough. Hx of seizures, htn EXAM: CT HEAD WITHOUT CONTRAST TECHNIQUE: Contiguous axial images were obtained from the base of the skull through the vertex without intravenous contrast. COMPARISON:  03/21/2013 FINDINGS: Brain: Mild periventricular white matter changes are consistent with small vessel disease. There is no intra or extra-axial fluid collection or mass lesion. The basilar cisterns and ventricles have a normal appearance. There is no CT evidence for acute infarction or hemorrhage. Vascular: There is atherosclerotic calcification of the internal carotid arteries. Skull: Normal. Negative for fracture or focal lesion. Sinuses/Orbits: No acute finding. Other: None IMPRESSION: 1. Small vessel disease. 2.  No evidence for acute intracranial abnormality. Electronically Signed   By: Nolon Nations M.D.   On: 05/16/2016 11:53    Procedures Procedures (including critical care time)  Medications Ordered in ED Medications - No data to display   Initial Impression / Assessment and Plan / ED Course  I have reviewed the triage vital signs and the nursing notes.  Pertinent labs & imaging results that were available during my care of the patient were reviewed by me and considered in my medical decision making (see chart for details).  Clinical Course   Patient returns with recurrent myalgias, fever, body aches, headache for the past several weeks. Seen 2 days ago for the same and thought to have viral syndrome. Patient with trip to [place] 2 weeks ago. Denies tick bites. No rashes.  Patient with rigors and shaking chills.  Lactate normal. WBC elevated. No meningismus. Unable to perform lumbar puncture with elevated INR. CT head is  negative. Do not suspect meningitis however. Encephalitis is possible. Cultures sent. HR and BP normal.   12:23 PM Patient rechecked, updated on results.    UA negative, CXR negative. Flu swab negative. RMSF sent.  Unclear source of fever. Patient with some  confusion and word finding difficulty. CT head negative. Treat for possible meningitis/encephalitis with antibiotics and acyclovir.  Unable to perform LP with coumadin use and elevated INR. Will likely need LP after INR reversed.  Continue antibiotics and antivirals for now. D/w Dr. Jerilee Hoh.  CRITICAL CARE Performed by: Ezequiel Essex Total critical care time: 30 minutes Critical care time was exclusive of separately billable procedures and treating other patients. Critical care was necessary to treat or prevent imminent or life-threatening deterioration. Critical care was time spent personally by me on the following activities: development of treatment plan with patient and/or surrogate as well as nursing, discussions with consultants, evaluation of patient's response to treatment, examination of patient, obtaining history from patient or surrogate, ordering and performing treatments and interventions, ordering and review of laboratory studies, ordering and review of radiographic studies, pulse oximetry and re-evaluation of patient's condition.  Final Clinical Impressions(s) / ED Diagnoses   Final diagnoses:  Febrile illness    New Prescriptions New Prescriptions   No medications on file   I personally performed the services described in this documentation, which was scribed in my presence. The recorded information has been reviewed and is accurate.   Ezequiel Essex, MD 05/18/16 [phone]

## 2016-05-18 NOTE — H&P (Signed)
History and Physical    Nathan [name] [id number] DOB: July 23, 1946 DOA: 05/18/2016  PCP: PROVIDER NOT IN SYSTEM  Patient coming from: Home   Chief Complaint: Fever   HPI: Nathan [name] is a 70 y.o. male with a past medical history significant for GERD, HTN, hyperlipidemia, and seizure disorder presented with complaints of and intermittent fever and chills for about 12 days after a trip to [place]. Also has been c/o myalgias. Family states he has been a little confused. He does have some difficulty with word finding when I am seeing him. His temps have been as high as 103. No rash, insect bites. 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  ED Course: Sodium 128 and WBC 13.6. CXR and UA without signs of infection.  Review of Systems: As per HPI otherwise 10 point review of systems negative.   Past Medical History:  Diagnosis Date  . GERD (gastroesophageal reflux disease)   . HTN (hypertension)   . Hyperlipidemia   . PTSD (post-traumatic stress disorder)   . Renal disorder   . Seizure [facility])     Past Surgical History:  Procedure Laterality Date  . JOINT REPLACEMENT     Left knee, R shoulder   Social History:  reports that he has been smoking Pipe.  He has never used smokeless tobacco. He reports that he does not drink alcohol or use drugs.  Allergies  Allergen Reactions  . Terazosin Other (See Comments)    Syncope    Family History: No HTN and DM in family members  Prior to Admission medications   Medication Sig Start Date End Date Taking? Authorizing Provider  ARIPiprazole (ABILIFY) 20 MG tablet Take 20 mg by mouth daily.    Yes Historical Provider, MD  cetirizine (ZYRTEC) 10 MG tablet Take 10 mg by mouth daily.   Yes Historical Provider, MD  diazepam (VALIUM) 5 MG tablet Take 5 mg by mouth daily.    Yes Historical Provider, MD  lisinopril (PRINIVIL,ZESTRIL) 40 MG tablet Take 40 mg by mouth every morning.   Yes Historical Provider, MD  meloxicam (MOBIC) 15 MG  tablet Take 15 mg by mouth at bedtime.   Yes Historical Provider, MD  metoprolol succinate (TOPROL-XL) 25 MG 24 hr tablet Take 25 mg by mouth 2 (two) times daily.    Yes Historical Provider, MD  Multiple Vitamin (MULTIVITAMIN WITH MINERALS) TABS tablet Take 1 tablet by mouth at bedtime.    Yes Historical Provider, MD  omeprazole (PRILOSEC) 20 MG capsule Take 20 mg by mouth daily.   Yes Historical Provider, MD  phenytoin (DILANTIN) 100 MG ER capsule Take 100 mg by mouth 5 (five) times daily.    Yes Historical Provider, MD  simvastatin (ZOCOR) 40 MG tablet Take 20 mg by mouth at bedtime.    Yes Historical Provider, MD  tamsulosin (FLOMAX) 0.4 MG CAPS capsule Take 0.4 mg by mouth at bedtime.    Yes Historical Provider, MD  venlafaxine XR (EFFEXOR-XR) 150 MG 24 hr capsule Take 150 mg by mouth at bedtime.    Yes Historical Provider, MD  warfarin (COUMADIN) 5 MG tablet Take 5-7.5 mg by mouth See admin instructions. Take 5 mg daily, except take 7.5 mg on Mondays.   Yes Historical Provider, MD  zolpidem (AMBIEN) 10 MG tablet Take 10 mg by mouth at bedtime as needed for sleep. Max 3-5 tablets per week.   Yes Historical Provider, MD  doxycycline (VIBRAMYCIN) 100 MG capsule Take 1 capsule (100 mg total) by mouth  2 (two) times daily. Patient not taking: Reported on 05/16/2016 01/26/15   Dorie Rank, MD    Physical Exam: Vitals:   05/18/16 1415 05/18/16 1430 05/18/16 1522 05/18/16 1604  BP: 152/80 150/75 (!) 138/113 (!) 164/81  Pulse: 96  93 91  Resp: 20 19 18    Temp:   (!) 103 F (39.4 C) (!) 103 F (39.4 C)  TempSrc:   Oral Oral  SpO2: 97%  96%   Weight:   89.9 kg (198 lb 3.1 oz)   Height:          Constitutional: NAD, calm, comfortable Vitals:   05/18/16 1415 05/18/16 1430 05/18/16 1522 05/18/16 1604  BP: 152/80 150/75 (!) 138/113 (!) 164/81  Pulse: 96  93 91  Resp: 20 19 18    Temp:   (!) 103 F (39.4 C) (!) 103 F (39.4 C)  TempSrc:   Oral Oral  SpO2: 97%  96%   Weight:   89.9 kg (198 lb  3.1 oz)   Height:       Eyes: PERRL, lids and conjunctivae normal ENMT: Mucous membranes are moist. Posterior pharynx clear of any exudate or lesions.Normal dentition.  Neck: normal, supple, no masses, no thyromegaly Respiratory: clear to auscultation bilaterally, no wheezing, no crackles. Normal respiratory effort. No accessory muscle use.  Cardiovascular: Regular rate and rhythm, no murmurs / rubs / gallops. No extremity edema. 2+ pedal pulses. No carotid bruits.  Abdomen: no tenderness, no masses palpated. No hepatosplenomegaly. Bowel sounds positive.  Musculoskeletal: no clubbing / cyanosis. No joint deformity upper and lower extremities. Good ROM, no contractures. Normal muscle tone.  Skin: no rashes, lesions, ulcers. No induration Neurologic: CN 2-12 grossly intact. Sensation intact, DTR normal. Strength 5/5 in all 4.  Psychiatric: Normal judgment and insight. Alert and oriented x 3. Normal mood.   Labs on Admission: I have personally reviewed following labs and imaging studies  CBC:  Recent Labs Lab 05/16/16 1125 05/18/16 1111  WBC 13.2* 13.6*  NEUTROABS 10.7* 10.1*  HGB 13.6 13.2  HCT 38.8* 37.0*  MCV 90.9 89.4  PLT 284 [id number]   Basic Metabolic Panel:  Recent Labs Lab 05/16/16 1125 05/18/16 1111  NA 132* 128*  K 4.7 3.9  CL 98* 94*  CO2 26 26  GLUCOSE 109* 106*  BUN 22* 19  CREATININE 1.07 1.05  CALCIUM 8.7* 8.5*   GFR: Estimated Creatinine Clearance: 68.7 mL/min (by C-G formula based on SCr of 1.05 mg/dL). Liver Function Tests:  Recent Labs Lab 05/16/16 1125 05/18/16 1111  AST 35 49*  ALT 29 35  ALKPHOS 78 68  BILITOT 0.8 0.9  PROT 8.1 7.9  ALBUMIN 4.3 4.1   No results for input(s): LIPASE, AMYLASE in the last 168 hours. No results for input(s): AMMONIA in the last 168 hours. Coagulation Profile:  Recent Labs Lab 05/18/16 1111  INR 2.17   Cardiac Enzymes: No results for input(s): CKTOTAL, CKMB, CKMBINDEX, TROPONINI in the last 168 hours. BNP  (last 3 results) No results for input(s): PROBNP in the last 8760 hours. HbA1C: No results for input(s): HGBA1C in the last 72 hours. CBG: No results for input(s): GLUCAP in the last 168 hours. Lipid Profile: No results for input(s): CHOL, HDL, LDLCALC, TRIG, CHOLHDL, LDLDIRECT in the last 72 hours. Thyroid Function Tests: No results for input(s): TSH, T4TOTAL, FREET4, T3FREE, THYROIDAB in the last 72 hours. Anemia Panel: No results for input(s): VITAMINB12, FOLATE, FERRITIN, TIBC, IRON, RETICCTPCT in the last 72 hours. Urine analysis:  Component Value Date/Time   COLORURINE YELLOW 05/18/2016 1008   APPEARANCEUR CLEAR 05/18/2016 1008   LABSPEC 1.020 05/18/2016 1008   PHURINE 6.0 05/18/2016 1008   GLUCOSEU NEGATIVE 05/18/2016 1008   HGBUR MODERATE (A) 05/18/2016 1008   BILIRUBINUR NEGATIVE 05/18/2016 1008   KETONESUR TRACE (A) 05/18/2016 1008   PROTEINUR NEGATIVE 05/18/2016 1008   UROBILINOGEN 0.2 01/26/2015 1045   NITRITE NEGATIVE 05/18/2016 1008   LEUKOCYTESUR NEGATIVE 05/18/2016 1008   Sepsis Labs: !!!!!!!!!!!!!!!!!!!!!!!!!!!!!!!!!!!!!!!!!!!! @LABRCNTIP (procalcitonin:4,lacticidven:4) ) Recent Results (from the past 240 hour(s))  Urine culture     Status: Abnormal   Collection Time: 05/16/16  1:44 PM  Result Value Ref Range Status   Specimen Description URINE, CLEAN CATCH  Final   Special Requests NONE  Final   Culture MULTIPLE SPECIES PRESENT, SUGGEST RECOLLECTION (A)  Final   Report Status 05/17/2016 FINAL  Final     Radiological Exams on Admission: Dg Chest 2 View  Result Date: 05/18/2016 CLINICAL DATA:  Fever. EXAM: CHEST  2 VIEW COMPARISON:  Radiographs of May 16, 2016. FINDINGS: The heart size and mediastinal contours are within normal limits. Both lungs are clear. No pneumothorax or pleural effusion is noted. The visualized skeletal structures are unremarkable. IMPRESSION: No active cardiopulmonary disease. Electronically Signed   By: Marijo Conception, M.D.    On: 05/18/2016 11:41   Ct Head Wo Contrast  Result Date: 05/18/2016 CLINICAL DATA:  Generalized body aches, weakness EXAM: CT HEAD WITHOUT CONTRAST TECHNIQUE: Contiguous axial images were obtained from the base of the skull through the vertex without intravenous contrast. COMPARISON:  05/16/2016 FINDINGS: Brain: No evidence of acute infarction, hemorrhage, extra-axial collection, ventriculomegaly, or mass effect. Generalized cerebral atrophy. Periventricular white matter low attenuation likely secondary to microangiopathy. Vascular: Cerebrovascular atherosclerotic calcifications are noted. Skull: Negative for fracture or focal lesion. Sinuses/Orbits: Visualized portions of the orbits are unremarkable. Visualized portions of the paranasal sinuses and mastoid air cells are unremarkable. Other: None. IMPRESSION: No acute intracranial pathology. Electronically Signed   By: Kathreen Devoid   On: 05/18/2016 12:06    EKG: Independently reviewed. None obtained in ED  Assessment/Plan Principal Problem:   Fever Active Problems:   HTN (hypertension)   GERD (gastroesophageal reflux disease)   Hyponatremia   Headache   Leukocytosis   FUO (fever of unknown origin)  FUO - Patient has a temperature of 100.9 , later increased to 103 -She has an elevated WBC count of 13.6.  -Source remains unclear. CXR/UA negative. -With fever, HA and mild confusion I believe encephalitis (more so than meningitis) remains in the differential. LP not attempted today given coagulopathy. Will reverse INR with FFP and vit K tonight and request LP for am. -If LP WNL, consider CT scan chest/abd/pelvis for further work up. -Was given vanc/rocephin/acyclovir in the ED. As I currently do not have a source of infection, will hold off on further anti-infectives as to not contaminate cx data any further. -Believe tick bites unlikely in [place] with freezing temps at current time of year.  Hyponatremia  - Sodium was 128 on admission.    -IVF.  HTN -Continue lisinopril and metoprolol    Hyperlipidemia  -Check TSH, continue synthroid.   Seizure disorder  -Continue dilantin.  Coagulopathy, iatrogenic -Patient is on coumadin (I am not clear why, he states he has a blood-clotting disorder). -Given necessity for LP, will revert coumadin with FFP and vit k.  DVT prophylaxis: SCDs Code Status: FULL  Family Communication: patient only  Disposition Plan: Discharge home  once improved.  Consults called: None  Admission status: Inpatient    Domingo Mend, MD [facility] If 7PM-7AM, please contact night-coverage [email or web address] Password [email or web address]  05/18/2016, 6:17 PM   By signing my name below, I, Collene Leyden, attest that this documentation has been prepared under the direction and in the presence of Domingo Mend MD. Electronically signed: Collene Leyden, Scribe. 05/18/16    I have reviewed the above documentation for accuracy and completeness, and I agree with the above.  Domingo Mend, MD [facility] Pager: [phone]

## 2016-05-18 NOTE — ED Triage Notes (Signed)
Pt reports he went on a hunting trip in [place] 2 weeks ago and says since last Friday he has had fever, generalized body aches, generalized weakness, and intermittent confusion.  Reports was incontinent of urine during the night last night.  Pt was seen here Sunday for same.

## 2016-05-18 NOTE — Progress Notes (Signed)
CRITICAL VALUE ALERT  Critical value received:  Lactic acid 2.1   Date of notification:  05/18/16  Time of notification:  [id number]  Critical value read back:Yes.    Nurse who received alert:  L. Bullins  MD notified (1st page):  Jerilee Hoh  Time of first page:  [phone]  MD notified (2nd page):  Time of second page:  Responding MD:  Jerilee Hoh  Time MD responded:  [phone]

## 2016-05-19 ENCOUNTER — Inpatient Hospital Stay (HOSPITAL_COMMUNITY): Payer: Non-veteran care

## 2016-05-19 DIAGNOSIS — R7881 Bacteremia: Secondary | ICD-10-CM

## 2016-05-19 DIAGNOSIS — R509 Fever, unspecified: Secondary | ICD-10-CM

## 2016-05-19 DIAGNOSIS — E871 Hypo-osmolality and hyponatremia: Secondary | ICD-10-CM

## 2016-05-19 LAB — BLOOD CULTURE ID PANEL (REFLEXED)
Acinetobacter baumannii: NOT DETECTED
CANDIDA ALBICANS: NOT DETECTED
CANDIDA PARAPSILOSIS: NOT DETECTED
CANDIDA TROPICALIS: NOT DETECTED
Candida glabrata: NOT DETECTED
Candida krusei: NOT DETECTED
ENTEROBACTERIACEAE SPECIES: NOT DETECTED
Enterobacter cloacae complex: NOT DETECTED
Enterococcus species: NOT DETECTED
Escherichia coli: NOT DETECTED
HAEMOPHILUS INFLUENZAE: NOT DETECTED
KLEBSIELLA OXYTOCA: NOT DETECTED
KLEBSIELLA PNEUMONIAE: NOT DETECTED
Listeria monocytogenes: NOT DETECTED
METHICILLIN RESISTANCE: DETECTED — AB
NEISSERIA MENINGITIDIS: NOT DETECTED
PROTEUS SPECIES: NOT DETECTED
Pseudomonas aeruginosa: NOT DETECTED
SERRATIA MARCESCENS: NOT DETECTED
STAPHYLOCOCCUS SPECIES: DETECTED — AB
STREPTOCOCCUS PYOGENES: NOT DETECTED
Staphylococcus aureus (BCID): NOT DETECTED
Streptococcus agalactiae: NOT DETECTED
Streptococcus pneumoniae: NOT DETECTED
Streptococcus species: NOT DETECTED

## 2016-05-19 LAB — PROTIME-INR
INR: 1.31
PROTHROMBIN TIME: 16.4 s — AB (ref 11.4–15.2)

## 2016-05-19 LAB — COMPREHENSIVE METABOLIC PANEL
ALK PHOS: 60 U/L (ref 38–126)
ALT: 40 U/L (ref 17–63)
AST: 50 U/L — AB (ref 15–41)
Albumin: 3.6 g/dL (ref 3.5–5.0)
Anion gap: 8 (ref 5–15)
BUN: 20 mg/dL (ref 6–20)
CALCIUM: 8.2 mg/dL — AB (ref 8.9–10.3)
CHLORIDE: 96 mmol/L — AB (ref 101–111)
CO2: 24 mmol/L (ref 22–32)
CREATININE: 0.91 mg/dL (ref 0.61–1.24)
GFR calc Af Amer: >60 mL/min (ref >60)
Glucose, Bld: 121 mg/dL — ABNORMAL HIGH (ref 65–99)
Potassium: 3.6 mmol/L (ref 3.5–5.1)
Sodium: 128 mmol/L — ABNORMAL LOW (ref 135–145)
Total Bilirubin: 1 mg/dL (ref 0.3–1.2)
Total Protein: 6.9 g/dL (ref 6.5–8.1)

## 2016-05-19 LAB — CBC
HCT: 32.1 % — ABNORMAL LOW (ref 39.0–52.0)
Hemoglobin: 11.5 g/dL — ABNORMAL LOW (ref 13.0–17.0)
MCH: 31.8 pg (ref 26.0–34.0)
MCHC: 35.8 g/dL (ref 30.0–36.0)
MCV: 88.7 fL (ref 78.0–100.0)
PLATELETS: 216 10*3/uL (ref 150–400)
RBC: 3.62 MIL/uL — ABNORMAL LOW (ref 4.22–5.81)
RDW: 12 % (ref 11.5–15.5)
WBC: 9.9 10*3/uL (ref 4.0–10.5)

## 2016-05-19 LAB — B. BURGDORFI ANTIBODIES: B burgdorferi Ab IgG+IgM: <0.91 {ISR} (ref 0.00–0.90)

## 2016-05-19 LAB — ROCKY MTN SPOTTED FVR ABS PNL(IGG+IGM)
RMSF IgG: NEGATIVE
RMSF IgM: 0.29 index (ref 0.00–0.89)

## 2016-05-19 MED ORDER — STROKE: EARLY STAGES OF RECOVERY BOOK
Freq: Once | Status: DC
  Filled 2016-05-19: qty 1

## 2016-05-19 MED ORDER — DEXTROSE 5 % IV SOLN
2.0000 g | Freq: Two times a day (BID) | INTRAVENOUS | Status: DC
  Administered 2016-05-19 – 2016-05-23 (×8): 2 g via INTRAVENOUS
  Filled 2016-05-19 (×10): qty 2

## 2016-05-19 MED ORDER — GADOBENATE DIMEGLUMINE 529 MG/ML IV SOLN
18.0000 mL | Freq: Once | INTRAVENOUS | Status: AC | PRN
  Administered 2016-05-19: 18 mL via INTRAVENOUS

## 2016-05-19 MED ORDER — ASPIRIN 300 MG RE SUPP
300.0000 mg | Freq: Every day | RECTAL | Status: DC
  Filled 2016-05-19: qty 1

## 2016-05-19 MED ORDER — SODIUM CHLORIDE 0.9 % IJ SOLN
INTRAMUSCULAR | Status: AC
  Administered 2016-05-19: 22:00:00
  Filled 2016-05-19: qty 10

## 2016-05-19 MED ORDER — SODIUM CHLORIDE 0.9 % IV SOLN
1750.0000 mg | Freq: Once | INTRAVENOUS | Status: AC
  Administered 2016-05-19: 1750 mg via INTRAVENOUS
  Filled 2016-05-19: qty 1500

## 2016-05-19 MED ORDER — ASPIRIN 325 MG PO TABS
325.0000 mg | ORAL_TABLET | Freq: Every day | ORAL | Status: DC
  Administered 2016-05-19 – 2016-05-23 (×5): 325 mg via ORAL
  Filled 2016-05-19 (×5): qty 1

## 2016-05-19 MED ORDER — VANCOMYCIN HCL 10 G IV SOLR
1250.0000 mg | Freq: Two times a day (BID) | INTRAVENOUS | Status: DC
  Administered 2016-05-20 – 2016-05-23 (×7): 1250 mg via INTRAVENOUS
  Filled 2016-05-19 (×10): qty 1250

## 2016-05-19 MED ORDER — ENSURE ENLIVE PO LIQD
237.0000 mL | Freq: Two times a day (BID) | ORAL | Status: DC
  Administered 2016-05-19 – 2016-05-23 (×8): 237 mL via ORAL

## 2016-05-19 NOTE — Care Management Note (Signed)
Case Management Note  Patient Details  Name: Nathan [name] MRN: [id number] Date of Birth: [birth year]  Subjective/Objective:  Patient adm from home with fever. 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                  Action/Plan: Spoke with Scientist, research (life sciences) at [facility], faxed necessary paperwork for transfer. Will await phone call from [facility] for update on bed situation.    Expected Discharge Date:       05/19/2016           Expected Discharge Plan:  Home/Self Care  In-House Referral:  NA  Discharge planning Services  CM Consult  Post Acute Care Choice:    Choice offered to:     DME Arranged:    DME Agency:     HH Arranged:    HH Agency:     Status of Service:  In process, will continue to follow  If discussed at Long Length of Stay Meetings, dates discussed:    Additional Comments:  Valene Villa, Chauncey Reading, RN 05/19/2016, 1:27 PM

## 2016-05-19 NOTE — Progress Notes (Signed)
CRITICAL VALUE ALERT  Critical value received:  Blood culture  Date of notification:  05/19/16  Time of notification: 2005  Critical value read back:yes  Nurse who received alert:  K. Marcello Moores RN  MD notified (1st page): Dr. Lorin Mercy  Time of first page:  2010  MD notified (2nd page):  Time of second page:  Responding MD:    Time MD responded:

## 2016-05-19 NOTE — Progress Notes (Signed)
CRITICAL VALUE ALERT  Critical value received:  Aerobic   Date of notification:  05/19/16  Time of notification:  1212  Critical value read back:Yes.    Nurse who received alert:  Theola Sequin RN  MD notified (1st page):  Sarajane Jews MD  Time of first page:  1213   Responding MD:  Sarajane Jews MD  Time MD responded:  [phone]

## 2016-05-19 NOTE — Progress Notes (Addendum)
PROGRESS NOTE  Nathan [name] [id number] DOB: 08/15/1945 DOA: 05/18/2016 PCP: PROVIDER NOT IN SYSTEM  Brief Narrative: 70 year old presented with ongoing myalgias, fever, headache for the past few weeks, recently returned from trip to [place], presented with Reiter's and shaking chills in the emergency department. No meningismus at that time. Lumbar puncture was deferred secondary to elevated INR. CT head was negative. At time of admission meningitis was not suspected. Encephalitis was considered. In the emergency department noted to have some confusion and word finding difficulty.  Assessment/Plan: 1. Fever without a source with associated headache, myalgias, word finding difficulty, intermittent confusion for the past 2 weeks, now with gram-positive bacteremia 1/2 bottles, if not contaminant would presume source. Leukocytosis on admission but now resolved. Chest x-ray and urinalysis unremarkable. CT head negative. LFTs unremarkable. Influenza negative. Skin unremarkable. Recently returned from trip to [place] for hunting with nausea, headache, dizziness and anorexia. No tick bites, no rashes. 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 2. Hyponatremia. Suspect hypovolemic. Minimal. 3. PTSD. Continue Abilify, Valium. Appears stable. 4. Seizure disorder, continue phenytoin. Appears ill. No recent seizures. 5. PMH RLE DVT 2014. On warfarin. INR has been reversed.   Recurrent fever but hemodynamics are stable and he appears clinically well. Does have word finding difficulty. Given ongoing fever without clear source, now with bacteremia, initiate IV antibiotics and follow-up culture data. Will follow up with infectious disease for further suggestions. His exam, history and findings do not suggest meningitis.  Awaiting LP, radiology deferred today because of INR, will attempt tomorrow.  Check Echo.  Continue IV fluids. Check BMP in the morning.  He desires  transfer to the [place], he is stable for transfer. Await contact from the [place].  ADDENDUM  Spoke to [place], they will accept after LP performed  Spoke to Dr. Linus Salmons ID, rec adding ceftriaxone to vancomycin. Check MRI brain, if has meningeal enhancement will add decadron.   DVT prophylaxis: SCDs Code Status: full code Family Communication: none Disposition Plan: home  Murray Hodgkins, MD  [facility] Direct contact: [phone] --[place]ia [place]  --[email or web address]; password [email or web address]  7PM-7AM contact night coverage as above 05/19/2016, 12:23 PM  LOS: 1 day   Consultants:    Procedures:    Antimicrobials:  Vancomycin 10/24  CC: f/u fever  Interval history/Subjective: Feels poorly, continues to have headache, muscle aches, poor appetite. No particular pain in left knee joint. Does report some increased pain in right shoulder.  ROS: Poor appetite  Objective: Vitals:   05/19/16 0336 05/19/16 0427 05/19/16 0514 05/19/16 0956  BP: (!) 149/62 (!) 153/86 (!) 119/54 (!) 147/75  Pulse: 92 98 96 95  Resp: 18 20 18 18   Temp: 99.4 F (37.4 C) (!) 100.6 F (38.1 C) 100.2 F (37.9 C) (!) 101.4 F (38.6 C)  TempSrc: Rectal  Oral Oral  SpO2: 99% 99% 96% 97%  Weight:      Height:        Intake/Output Summary (Last 24 hours) at 05/19/16 1223 Last data filed at 05/19/16 1128  Gross per 24 hour  Intake          5072.17 ml  Output              650 ml  Net          4422.17 ml     Filed Weights   05/18/16 1007 05/18/16 1522  Weight: 94.3 kg (208 lb) 89.9 kg (198 lb 3.1 oz)    Exam:  Constitutional:  . Appears calm and comfortable, sitting up, eating lunch Eyes:  . Pupils and irises appear unremarkable ENMT:  . grossly normal hearing  Respiratory:  . CTA bilaterally, no w/r/r.  . Respiratory effort normal. No retractions or accessory muscle use Cardiovascular:  . RRR, no m/r/g . No LE extremity edema   Musculoskeletal:  . RUE, LUE, RLE, LLE   o strength and tone  normal, no atrophy, no abnormal movements  Left shoulder and right knee joints appear unremarkable. No erythema, warmth or tenderness. Skin:  . No rashes, lesions, ulcers noted . palpation of skin: no induration or nodules Neurologic:  . No pronator drift, no upper extremity dysdiadochokinesis . Some stuttering and difficulty locating words at times Psychiatric:  . judgement and insight appear normal . Mental status o Mood, affect appropriate o Orientation to person, place, time, reason for admission  I have personally reviewed following labs and imaging studies:  Sodium 128, AST 50, remainder CMP unremarkable  WBC normal at 9.9  Scheduled Meds: . ARIPiprazole  20 mg Oral Daily  . diazepam  5 mg Oral Daily  . feeding supplement (ENSURE ENLIVE)  237 mL Oral BID BM  . lisinopril  40 mg Oral q morning - 10a  . loratadine  10 mg Oral Daily  . metoprolol succinate  25 mg Oral BID  . multivitamin with minerals  1 tablet Oral QHS  . pantoprazole  40 mg Oral Daily  . phenytoin  100 mg Oral 5 X Daily  . simvastatin  20 mg Oral QHS  . tamsulosin  0.4 mg Oral QHS  . venlafaxine XR  150 mg Oral QHS   Continuous Infusions: . sodium chloride 75 mL/hr at 05/18/16 2052    Principal Problem:   Fever Active Problems:   HTN (hypertension)   GERD (gastroesophageal reflux disease)   Hyponatremia   Headache   Leukocytosis   FUO (fever of unknown origin)   LOS: 1 day

## 2016-05-19 NOTE — Progress Notes (Signed)
Pharmacy Antibiotic Note  Nathan [name] is a 70 y.o. male admitted on 05/18/2016 with bacteremia.  Pharmacy has been consulted for vancomycin dosing. Rocephin and acyclovir were started yesterday but then dc'ed as no source of infection was identified.Vanc 1 gm given yesterday as well.  Plan: Vancomycin 1750 mg IV X 1 then 1250 mg IV q12 hours F/u renal function, cultures and clinical course  Height: 5\' 11"  (180.3 cm) Weight: 198 lb 3.1 oz (89.9 kg) IBW/kg (Calculated) : 75.3  Temp (24hrs), Avg:100.7 F (38.2 C), Min:98.8 F (37.1 C), Max:103 F (39.4 C)   Recent Labs Lab 05/16/16 1125 05/18/16 1111 05/18/16 1119 05/18/16 1442 05/18/16 1654 05/19/16 0632  WBC 13.2* 13.6*  --   --   --  9.9  CREATININE 1.07 1.05  --   --   --  0.91  LATICACIDVEN  --   --  1.04 1.3 2.1*  --     Estimated Creatinine Clearance: 79.3 mL/min (by C-G formula based on SCr of 0.91 mg/dL).    Allergies  Allergen Reactions  . Terazosin Other (See Comments)    Syncope    Antimicrobials this admission: rocephin 10/24 >> 10/24 Acyclovir  10/24 >> 10/24 Vancomycin 10/24 X 1 dose, 10/25>>  Microbiology results: 10/24 BCx: 1/2 gpc 10/24 UCx:    Thank you for allowing pharmacy to be a part of this patient's care.  Excell Seltzer Poteet 05/19/2016 12:40 PM

## 2016-05-19 NOTE — Care Management (Signed)
Spoke with transfer coordinator of [place], patient will remain here and have scheduled LP tomorrow and then we will reassess for transfer to [facility] tomorrow.

## 2016-05-19 NOTE — Progress Notes (Signed)
Radiology called and stated that Pt will not have procedure today due to PT/INR not normalized for procedure to be completed. Family and doctor notified and information explained. Oswald Hillock, RN

## 2016-05-19 NOTE — Progress Notes (Signed)
Initial Nutrition Assessment  DOCUMENTATION CODES:  Not applicable  INTERVENTION:  Ensure Enlive po BID, each supplement provides 350 kcal and 20 grams of protein  Meal Preferences  NUTRITION DIAGNOSIS:  Inadequate oral intake related to poor appetite as evidenced by an estimated energy intake that met  < or equal to 50% of needs for > or equal to 5 days.  GOAL:  Patient will meet greater than or equal to 90% of their needs  MONITOR:  PO intake, Supplement acceptance, Labs  REASON FOR ASSESSMENT:  Malnutrition Screening Tool    ASSESSMENT:  70 y/o male PMHx GERD, HTN, HLD, Seizure disorder, PTSD. Presented with complaints of intermittent fever/chills x 12 days and reported confusion after hunting trip in [place]. Admitted for workup of origin of fevers  Pt states he has had no appetite for the last 5 days. When asked to quantify, he stated he was eating about 25% of his normal.  He reports UBW is 208 lbs. There is no wt history in > 1 year to corroborate this loss.    He ate poorly this morning which he says was more related to his distaste for the food then lack of appetite. RD took meal preferences. He was agreeable to supplementation  Labs/vitals reviewed: Hyponatremic, Lactic acid 2.1, Febrile Medications: mvi with min, ppi,   Recent Labs Lab 05/16/16 1125 05/18/16 1111 05/19/16 0632  NA 132* 128* 128*  K 4.7 3.9 3.6  CL 98* 94* 96*  CO2 '26 26 24  ' BUN 22* 19 20  CREATININE 1.07 1.05 0.91  CALCIUM 8.7* 8.5* 8.2*  GLUCOSE 109* 106* 121*   Diet Order:  DIET SOFT Room service appropriate? Yes; Fluid consistency: Thin  Skin: Rash to arm/buttocks   Last BM:  10/25 - diarrhea  Height:  Ht Readings from Last 1 Encounters:  05/18/16 '5\' 11"'  (1.803 m)   Weight:  Wt Readings from Last 1 Encounters:  05/18/16 198 lb 3.1 oz (89.9 kg)   Wt Readings from Last 10 Encounters:  05/18/16 198 lb 3.1 oz (89.9 kg)  01/26/15 215 lb (97.5 kg)  03/21/13 203 lb 7.8 oz (92.3  kg)   Ideal Body Weight:  78.18 kg  BMI:  Body mass index is 27.64 kg/m.  Estimated Nutritional Needs:  Kcal:  2000-2200 (22-25 kcal/kg bw) Protein:  86-102 g (1.1-1.3 g/kg ibw) Fluid:  2.2 liters + enough to replace stool losses  EDUCATION NEEDS:  No education needs identified at this time  Burtis Junes RD, LDN, [place] Clinical Nutrition Pager: [phone] 05/19/2016 10:19 AM

## 2016-05-19 NOTE — Progress Notes (Addendum)
Called by radiology regarding abnormal MRI. 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  There are no additional infarcts noted.  This is a bit of an unexpected result and so I called to discuss briefly with Dr. Sarajane Jews.  There is no overnight neurology support at [facility] and since this is a very small lesion, will implement stroke order set without attempting to discuss with neurology in [place] at this time. -There was no IV contrast given for the MRI and so meningeal enhancement could not be evaluated.  If this is necessary, the study will need to be reordered with gadolinium. -An LP under fluoro guidance has been ordered but not yet performed. -Will go ahead and proceed with MRI with contrast at this time.  Meanwhile, I was subsequently paged at almost the same time with the report of gram + cocci in clusters in his aerobic bottles, staph species but not staph aureus, methicillin resistant gene detected.  -He is already on Vanc and Rocephin -Vancomycin would be expected to cover this organism, although sensitivities are not available at this time. -No change to antibiotic dosing at this time.  Carlyon Shadow, M.D.   Addendum: I spoke again with Dr. Jobe Igo from radiology regarding desire for STAT MRI.  Patient will have this study done shortly.   If positive for meningeal enhancement, will need addition of Decadron overnight.  JEY

## 2016-05-20 ENCOUNTER — Inpatient Hospital Stay (HOSPITAL_COMMUNITY): Payer: Non-veteran care

## 2016-05-20 DIAGNOSIS — I639 Cerebral infarction, unspecified: Secondary | ICD-10-CM

## 2016-05-20 DIAGNOSIS — R4789 Other speech disturbances: Secondary | ICD-10-CM

## 2016-05-20 DIAGNOSIS — R509 Fever, unspecified: Secondary | ICD-10-CM

## 2016-05-20 LAB — PREPARE FRESH FROZEN PLASMA
UNIT DIVISION: 0
Unit division: 0

## 2016-05-20 LAB — CSF CELL COUNT WITH DIFFERENTIAL
EOS CSF: 0 % (ref 0–1)
LYMPHS CSF: 96 % — AB (ref 40–80)
MONOCYTE-MACROPHAGE-SPINAL FLUID: 3 % — AB (ref 15–45)
Other Cells, CSF: 0
RBC Count, CSF: 4 /mm3 — ABNORMAL HIGH
SEGMENTED NEUTROPHILS-CSF: 1 % (ref 0–6)
TUBE #: 4
WBC CSF: 102 /mm3 — AB (ref 0–5)

## 2016-05-20 LAB — LIPID PANEL
Cholesterol: 147 mg/dL (ref 0–200)
HDL: 36 mg/dL — ABNORMAL LOW (ref >40)
LDL CALC: 87 mg/dL (ref 0–99)
TRIGLYCERIDES: 118 mg/dL (ref <150)
Total CHOL/HDL Ratio: 4.1 RATIO
VLDL: 24 mg/dL (ref 0–40)

## 2016-05-20 LAB — PROTIME-INR
INR: 1.11
Prothrombin Time: 14.3 seconds (ref 11.4–15.2)

## 2016-05-20 LAB — PROTEIN, CSF: TOTAL PROTEIN, CSF: 123 mg/dL — AB (ref 15–45)

## 2016-05-20 LAB — HIV ANTIBODY (ROUTINE TESTING W REFLEX): HIV SCREEN 4TH GENERATION: NONREACTIVE

## 2016-05-20 LAB — GLUCOSE, CSF: Glucose, CSF: 53 mg/dL (ref 40–70)

## 2016-05-20 MED ORDER — HYDROCODONE-ACETAMINOPHEN 5-325 MG PO TABS
1.0000 | ORAL_TABLET | Freq: Four times a day (QID) | ORAL | Status: DC | PRN
  Administered 2016-05-20 – 2016-05-23 (×9): 1 via ORAL
  Filled 2016-05-20 (×11): qty 1

## 2016-05-20 MED ORDER — LIDOCAINE HCL (PF) 1 % IJ SOLN
INTRAMUSCULAR | Status: AC
  Filled 2016-05-20: qty 5

## 2016-05-20 MED ORDER — DEXTROSE 5 % IV SOLN
10.0000 mg/kg | Freq: Three times a day (TID) | INTRAVENOUS | Status: DC
  Administered 2016-05-20 – 2016-05-22 (×5): 900 mg via INTRAVENOUS
  Filled 2016-05-20 (×12): qty 18

## 2016-05-20 MED ORDER — ACYCLOVIR SODIUM 50 MG/ML IV SOLN
INTRAVENOUS | Status: AC
Start: 2016-05-20 — End: 2016-05-20
  Filled 2016-05-20: qty 10

## 2016-05-20 NOTE — Progress Notes (Signed)
PROGRESS NOTE  Nathan [name] [id number] DOB: 28-Dec-1945 DOA: 05/18/2016 PCP: PROVIDER NOT IN SYSTEM  Brief Narrative: 70 year old presented with ongoing myalgias, fever, headache for the past few weeks, recently returned from trip to [place], presented with rigors and shaking chills in the emergency department. No meningismus at that time. Lumbar puncture was deferred secondary to elevated INR. CT head was negative. At time of admission meningitis was not suspected. Encephalitis was considered. In the emergency department noted to have some confusion and word finding difficulty. He had further fever and after discussion with infectious disease, recommendation was made to proceed with treatment aimed at meningitis. Blood culture positive but may be spurious. MRI of the brain revealed small stroke of unclear significance. Further stroke evaluation being pursued. Plan for lumbar puncture today.  Assessment/Plan: 1. Fever, headache, word finding difficulty, intermittent confusion, positive bacteremia. Clinically improved. Afebrile 12 hours. Word finding difficulty lessened. MRI brain as below. No unifying diagnosis as of yet. Suspect blood culture is spurious. Chest x-ray and urinalysis were unremarkable, LFTs unremarkable, influenza negative. No active skin lesions. Was in [place] 2 weeks ago hunting, weather was cold, he did eat [place], well-cooked. 2. 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 Neurologic exam nonfocal with the exception of the speech as described. 3. Hypovolemic hyponatremia 4. PTSD, stable, continue Abilify and Valium. 5. Seizure disorder, stable. Continue for the time. 6. PMH RLE DVT 2014. On warfarin. INR has been reversed.   Clinically improved. Plan to continue IV antibiotics, follow-up culture data. Repeat blood cultures today. Check echocardiogram. Plan for lumbar puncture today. No evidence of meningeal enhancement on MRI and clinically does not appear to  have meningitis. Will discuss again with infectious disease today when more data available.  Stroke evaluation. Aspirin.   DVT prophylaxis: SCDs Code Status: full code Family Communication: none Disposition Plan: home  Murray Hodgkins, MD  [facility] Direct contact: [phone] --[address] [place]  --[email or web address]; password [email or web address]  7PM-7AM contact night coverage as above 05/20/2016, 10:40 AM  LOS: 2 days   Consultants:    Procedures:    Antimicrobials:  Vancomycin 10/24 >>  Ceftriaxone 10/25 >>  CC: f/u fever  Interval history/Subjective: MRI showed a small stroke last night. Further MRI did not demonstrate any meningeal enhancement.  Feels better today. Word finding difficulties have lessened. Still has significant headache. Eating.  Objective: Vitals:   05/20/16 0130 05/20/16 0230 05/20/16 0430 05/20/16 0955  BP: 117/60 115/68 123/71 130/64  Pulse: 72 75 76 79  Resp: 16 18 17 18   Temp: 98.9 F (37.2 C) 98 F (36.7 C) 97.9 F (36.6 C) 98.8 F (37.1 C)  TempSrc: Oral Oral Oral   SpO2: 96% 98% 97% 98%  Weight:      Height:        Intake/Output Summary (Last 24 hours) at 05/20/16 1040 Last data filed at 05/20/16 0900  Gross per 24 hour  Intake             2550 ml  Output             1150 ml  Net             1400 ml     Filed Weights   05/18/16 1007 05/18/16 1522  Weight: 94.3 kg (208 lb) 89.9 kg (198 lb 3.1 oz)    Exam:    Constitutional:  . Appears calm and comfortable. Eyes:  . Pupils, irises unremarkable Respiratory:  . CTA bilaterally, no w/r/r.  Marland Kitchen  Respiratory effort normal. No retractions or accessory muscle use Cardiovascular:  . RRR, no m/r/g . No LE extremity edema   Musculoskeletal:   Appears grossly unremarkable Skin:  . No rashes, lesions, ulcers noted Neurologic:  . Cranial nerves II-12 intact. Marland Kitchen Speech generally fluent and clear. Still some stuttering and word finding difficulties but less noticeable  today. Psychiatric:  . judgement and insight appear normal . Mental status o Mood, affect appropriate  I have personally reviewed following labs and imaging studies:  LDL 87.  INR 1.11.  Scheduled Meds: .  stroke: mapping our early stages of recovery book   Does not apply Once  . ARIPiprazole  20 mg Oral Daily  . aspirin  300 mg Rectal Daily   Or  . aspirin  325 mg Oral Daily  . cefTRIAXone (ROCEPHIN)  IV  2 g Intravenous Q12H  . diazepam  5 mg Oral Daily  . feeding supplement (ENSURE ENLIVE)  237 mL Oral BID BM  . lisinopril  40 mg Oral q morning - 10a  . loratadine  10 mg Oral Daily  . metoprolol succinate  25 mg Oral BID  . multivitamin with minerals  1 tablet Oral QHS  . pantoprazole  40 mg Oral Daily  . phenytoin  100 mg Oral 5 X Daily  . simvastatin  20 mg Oral QHS  . tamsulosin  0.4 mg Oral QHS  . vancomycin  1,250 mg Intravenous Q12H  . venlafaxine XR  150 mg Oral QHS   Continuous Infusions: . sodium chloride 75 mL/hr at 05/19/16 1318    Principal Problem:   Bacteremia Active Problems:   HTN (hypertension)   GERD (gastroesophageal reflux disease)   Hyponatremia   Fever   Headache   Leukocytosis   LOS: 2 days

## 2016-05-20 NOTE — Progress Notes (Signed)
MD paged via text of CSF smear results.

## 2016-05-20 NOTE — Progress Notes (Signed)
Nutrition Follow-up  DOCUMENTATION CODES:  Not applicable  INTERVENTION:  Continue Ensure Enlive po BID, each supplement provides 350 kcal and 20 grams of protein  New Meal Preferences  NUTRITION DIAGNOSIS:  Inadequate oral intake related to poor appetite as evidenced by an estimated energy intake that met  < or equal to 50% of needs for > or equal to 5 days.  GOAL:  Patient will meet greater than or equal to 90% of their needs  MONITOR:  PO intake, Supplement acceptance, Labs  REASON FOR ASSESSMENT:  Consult  "CVA"  ASSESSMENT:  70 y/o male PMHx GERD, HTN, HLD, Seizure disorder, PTSD. Presented with complaints of intermittent fever/chills x 12 days and reported confusion after hunting trip in [place]. Admitted for workup of origin of fevers  Interval History 10/25-10/26.  Patient was found to have acute infarct. Nutrition consulted as result.   Today, pt  Is seen with his Lunch tray in front of them with about 10% eaten. Pt reports that his appetite is actaully better and the amount that he eat ate was "alot". He likes the Ensures that he has been receiving.   Unfortunately, he did not get the cold cereal he had requested. He said he look like fruit "as much as I can get it" as well as yogurt.    As far as stroke risk factors, pts BP is currently well controlled. His lipid panel showed only a low HDL. A1C is pending. He is not obese. He reports a fairly healthy diet. If stroke etiology found to be strongly related to nutrition, can have more in depth discussion.  Labs/vitals reviewed: Hyponatremic, Lactic acid 2.1, No longer Febrile Medications: mvi with min, ppi, EE BID, senokot  Recent Labs Lab 05/16/16 1125 05/18/16 1111 05/19/16 0632  NA 132* 128* 128*  K 4.7 3.9 3.6  CL 98* 94* 96*  CO2 _0 BUN 22* 19 20  CREATININE 1.07 1.05 0.91  CALCIUM 8.7* 8.5* 8.2*  GLUCOSE 109* 106* 121*   Diet Order:  DIET SOFT Room service appropriate? Yes; Fluid consistency:  Thin  Skin: Rash to arm/buttocks   Last BM:  10/25-diarrhea  Height:  Ht Readings from Last 1 Encounters:  05/18/16 5' 11" (1.803 m)   Weight:  Wt Readings from Last 1 Encounters:  05/18/16 198 lb 3.1 oz (89.9 kg)   Wt Readings from Last 10 Encounters:  05/18/16 198 lb 3.1 oz (89.9 kg)  01/26/15 215 lb (97.5 kg)  03/21/13 203 lb 7.8 oz (92.3 kg)   Ideal Body Weight:  78.18 kg  BMI:  Body mass index is 27.64 kg/m.  Estimated Nutritional Needs:  Kcal:  2000-2200 (22-25 kcal/kg bw) Protein:  86-102 g (1.1-1.3 g/kg ibw) Fluid:  2.2 liters + enough to replace stool losses  EDUCATION NEEDS:  No education needs identified at this time  Burtis Junes RD, LDN, [facility] Pager: [phone] 05/20/2016 4:14 PM

## 2016-05-20 NOTE — Evaluation (Signed)
Physical Therapy Evaluation Patient Details Name: Nathan [name] MRN: [id number] DOB: 1945/10/19 Today's Date: 05/20/2016   History of Present Illness  70 year old presented with ongoing myalgias, fever, headache for the past few weeks, recently returned from trip to [place], presented with rigors and shaking chills in the emergency department. No meningismus at that time. Lumbar puncture was deferred secondary to elevated INR. CT head was negative. At time of admission meningitis was not suspected. Encephalitis was considered. In the emergency department noted to have some confusion and word finding difficulty. He had further fever and after discussion with infectious disease, recommendation was made to proceed with treatment aimed at meningitis. Blood culture positive but may be spurious. MRI of the brain revealed small stroke of unclear significance. Further stroke evaluation being pursued. Plan for lumbar puncture today.  MRI showed a small stroke last night. Further MRI did not demonstrate any meningeal enhancement.    Clinical Impression  Pt received in bed, family present, but wife stepped out during tx.  Pt expressed that prior to admission, he was independent with all functional mobility.  During PT tx, he ambulated 45ft with RW and Min A due to unsteadiness.  Pt required increased time for completing requested tasks, and repetition of commands.  Further distance limited due to needing to be cleaned up after BM.  Pt is recommended for 24/7 supervision at home, and f/u with OPPT for balance, strength and endurance training.     Follow Up Recommendations Outpatient PT;Supervision/Assistance - 24 hour    Equipment Recommendations  None recommended by PT    Recommendations for Other Services       Precautions / Restrictions Precautions Precautions: Fall;Other (comment) (Droplet Isolation - wear surgical mask) Precaution Comments: 1 fall where he hit his head on concrete (7-8 months ago), 2  falls the day he came to the hospital.  Pt states that he looses his balance when he falls.   Restrictions Weight Bearing Restrictions: No      Mobility  Bed Mobility Overal bed mobility: Needs Assistance Bed Mobility: Supine to Sit     Supine to sit: Supervision;HOB elevated        Transfers Overall transfer level: Needs assistance Equipment used: Rolling walker (2 wheeled) Transfers: Sit to/from Stand Sit to Stand: Min assist            Ambulation/Gait Ambulation/Gait assistance: Min guard;Min assist Ambulation Distance (Feet): 15 Feet Assistive device: Rolling walker (2 wheeled) Gait Pattern/deviations: Step-through pattern   Gait velocity interpretation: <1.8 ft/sec, indicative of risk for recurrent falls General Gait Details: No overt LOB, but pt is unsteady.   Stairs            Wheelchair Mobility    Modified Rankin (Stroke Patients Only)       Balance Overall balance assessment: Needs assistance   Sitting balance-Leahy Scale: Normal     Standing balance support: Bilateral upper extremity supported Standing balance-Leahy Scale: Fair                               Pertinent Vitals/Pain      Home Living   Living Arrangements: Spouse/significant other Available Help at Discharge: Available 24 hours/day Type of Home: House Home Access: Level entry     Home Layout: Two level;Able to live on main level with bedroom/bathroom Home Equipment: Kasandra Knudsen - single point;Walker - 2 wheels      Prior Function  Gait / Transfers Assistance Needed: independent.   ADL's / Homemaking Assistance Needed: independent. driving.         Hand Dominance   Dominant Hand: Right    Extremity/Trunk Assessment   Upper Extremity Assessment: Generalized weakness           Lower Extremity Assessment: Generalized weakness         Communication      Cognition Arousal/Alertness: Awake/alert Behavior During Therapy: WFL for tasks  assessed/performed Overall Cognitive Status: Within Functional Limits for tasks assessed                      General Comments      Exercises     Assessment/Plan    PT Assessment Patient needs continued PT services  PT Problem List Decreased strength;Decreased activity tolerance;Decreased balance;Decreased mobility;Decreased coordination;Decreased cognition;Decreased safety awareness;Decreased knowledge of precautions;Pain          PT Treatment Interventions DME instruction;Gait training;Stair training;Functional mobility training;Therapeutic activities;Therapeutic exercise;Balance training;Neuromuscular re-education;Patient/family education    PT Goals (Current goals can be found in the Care Plan section)  Acute Rehab PT Goals Patient Stated Goal: Pt wants to get stronger and go home.  PT Goal Formulation: With patient/family Time For Goal Achievement: 05/27/16    Frequency Min 3X/week   Barriers to discharge        Co-evaluation               End of Session Equipment Utilized During Treatment: Gait belt Activity Tolerance: Patient tolerated treatment well Patient left: in chair;with call bell/phone within reach Nurse Communication: Mobility status (megan, RN notified of pt's location. )    Functional Assessment Tool Used: [employer] "6-clicks"  Functional Limitation: Mobility: Walking and moving around Mobility: Walking and Moving Around Current Status [phone]): At least 20 percent but less than 40 percent impaired, limited or restricted Mobility: Walking and Moving Around Goal Status [phone]): At least 1 percent but less than 20 percent impaired, limited or restricted    Time: 1008-1047 PT Time Calculation (min) (ACUTE ONLY): 39 min   Charges:   PT Evaluation $PT Eval Moderate Complexity: 1 Procedure PT Treatments $Gait Training: 8-22 mins $Therapeutic Activity: 8-22 mins   PT G Codes:   PT G-Codes **NOT FOR INPATIENT  CLASS** Functional Assessment Tool Used: [employer] "6-clicks"  Functional Limitation: Mobility: Walking and moving around Mobility: Walking and Moving Around Current Status [phone]): At least 20 percent but less than 40 percent impaired, limited or restricted Mobility: Walking and Moving Around Goal Status [phone]): At least 1 percent but less than 20 percent impaired, limited or restricted    Beth Cannan Beeck, PT, DPT X: [phone]

## 2016-05-20 NOTE — Progress Notes (Signed)
*  PRELIMINARY RESULTS* Echocardiogram 2D Echocardiogram has been performed.  Samuel Germany 05/20/2016, 5:19 PM

## 2016-05-20 NOTE — Progress Notes (Signed)
CRITICAL VALUE ALERT  Critical value received:  CSF WBC 102  Date of notification:  05/20/16  Time of notification:  1633  Critical value read back:Yes.    Nurse who received alert:  Elmyra Ricks, RN  MD notified (1st page):  Sarajane Jews  Time of first page:  1638  MD notified (2nd page):  Time of second page:  Responding MD:  Sarajane Jews  Time MD responded:  1655

## 2016-05-20 NOTE — Consult Note (Signed)
[facility]  A. , MD     [email or web address]          Nathan [name] is an 69 y.o. male.   ASSESSMENT/PLAN: 1. The clinical picture seems most consistent with viral meningitis or viral encephalitis.  2. Tiny right caudate nuclei infarct. 3. Seizure disorder well-controlled. 3. Posttraumatic stress disorder on multiple psychotropic medications.   RECOMMENDATION: The patient will be started on acyclovir for possible herpes encephalitis. The PCR is pending. For now we'll continue with the broad spectrum antibiotics. The CSF analysis seems most consistent however with a viral process and not bacterial infection. We can wait for another day or 2 before considering discontinuation of antibiotics. The questionable bacteremia - Staphylococcus coagulase negative - seems to be most consistent with contaminant. Typical stroke workup is pending.      The patient is 69-year-old white male who presents with 2 week history of severe headaches and fever. He was noted to have a fever 103 today. The headaches are located in the posterior occipital region. Headaches range from 6/10 to a 10 at times. He does not have a baseline history of headaches. The patient was in [place] for a couple weeks on a hunting trip. He tells me that the temperature was cold in the 20s. No insect bite is reported. He does have a history of seizure disorder but apparently has had only one seizure 16 years ago. His he reports that it was quite severe with significant encephalopathy and therefore he was placed on medications. No recurrent seizures. He is on multiple psychotropic medications for posttraumatic stress disorder. No other symptoms are reported other than having some diarrhea for the last 2 days. He denies chest pain, shortness of breath or GU symptoms. The review systems otherwise negative.   GENERAL: Watching TV and in some discomfort from headache but no acute distress.  HEENT: Neck is  supple.  ABDOMEN: soft  EXTREMITIES: No edema. 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   BACK: Normal  SKIN: Normal by inspection.    MENTAL STATUS: Alert and oriented. Speech, language and cognition are generally intact. Judgment and insight normal.   CRANIAL NERVES: Pupils are equal, round and reactive to light and accomodation; extra ocular movements are full, there is no significant nystagmus; visual fields are full; upper and lower facial muscles are normal in strength and symmetric, there is no flattening of the nasolabial folds; tongue is midline; uvula is midline; shoulder elevation is normal.  MOTOR: Normal tone, bulk and strength; no pronator drift.  COORDINATION: Left finger to nose is normal, right finger to nose is normal, No rest tremor; no intention tremor; no postural tremor; no bradykinesia.  REFLEXES: Deep tendon reflexes are symmetrical and normal - except absent left knee.   SENSATION: Normal to light touch and temperature.       Blood pressure 130/72, pulse 76, temperature 99.1 F (37.3 C), temperature source Oral, resp. rate 20, height 5' 11" (1.803 m), weight 198 lb 3.1 oz (89.9 kg), SpO2 96 %.  Past Medical History:  Diagnosis Date  . GERD (gastroesophageal reflux disease)   . HTN (hypertension)   . Hyperlipidemia   . PTSD (post-traumatic stress disorder)   . Renal disorder   . Seizure (HCC)     Past Surgical History:  Procedure Laterality Date  . JOINT REPLACEMENT     Left knee, R shoulder    History reviewed. No pertinent family history.  Social History:  reports that he has been   smoking Pipe.  He has never used smokeless tobacco. He reports that he does not drink alcohol or use drugs.  Allergies:  Allergies  Allergen Reactions  . Terazosin Other (See Comments)    Syncope    Medications: Prior to Admission medications   Medication Sig Start Date End Date Taking? Authorizing Provider  ARIPiprazole (ABILIFY) 20 MG  tablet Take 20 mg by mouth daily.    Yes Historical Provider, MD  cetirizine (ZYRTEC) 10 MG tablet Take 10 mg by mouth daily.   Yes Historical Provider, MD  diazepam (VALIUM) 5 MG tablet Take 5 mg by mouth daily.    Yes Historical Provider, MD  lisinopril (PRINIVIL,ZESTRIL) 40 MG tablet Take 40 mg by mouth every morning.   Yes Historical Provider, MD  meloxicam (MOBIC) 15 MG tablet Take 15 mg by mouth at bedtime.   Yes Historical Provider, MD  metoprolol succinate (TOPROL-XL) 25 MG 24 hr tablet Take 25 mg by mouth 2 (two) times daily.    Yes Historical Provider, MD  Multiple Vitamin (MULTIVITAMIN WITH MINERALS) TABS tablet Take 1 tablet by mouth at bedtime.    Yes Historical Provider, MD  omeprazole (PRILOSEC) 20 MG capsule Take 20 mg by mouth daily.   Yes Historical Provider, MD  phenytoin (DILANTIN) 100 MG ER capsule Take 100 mg by mouth 5 (five) times daily.    Yes Historical Provider, MD  simvastatin (ZOCOR) 40 MG tablet Take 20 mg by mouth at bedtime.    Yes Historical Provider, MD  tamsulosin (FLOMAX) 0.4 MG CAPS capsule Take 0.4 mg by mouth at bedtime.    Yes Historical Provider, MD  venlafaxine XR (EFFEXOR-XR) 150 MG 24 hr capsule Take 150 mg by mouth at bedtime.    Yes Historical Provider, MD  warfarin (COUMADIN) 5 MG tablet Take 5-7.5 mg by mouth See admin instructions. Take 5 mg daily, except take 7.5 mg on Mondays.   Yes Historical Provider, MD  zolpidem (AMBIEN) 10 MG tablet Take 10 mg by mouth at bedtime as needed for sleep. Max 3-5 tablets per week.   Yes Historical Provider, MD  doxycycline (VIBRAMYCIN) 100 MG capsule Take 1 capsule (100 mg total) by mouth 2 (two) times daily. Patient not taking: Reported on 05/16/2016 01/26/15   Jon Knapp, MD    Scheduled Meds: .  stroke: mapping our early stages of recovery book   Does not apply Once  . acyclovir  10 mg/kg Intravenous Q8H  . ARIPiprazole  20 mg Oral Daily  . aspirin  300 mg Rectal Daily   Or  . aspirin  325 mg Oral Daily  .  cefTRIAXone (ROCEPHIN)  IV  2 g Intravenous Q12H  . diazepam  5 mg Oral Daily  . feeding supplement (ENSURE ENLIVE)  237 mL Oral BID BM  . lisinopril  40 mg Oral q morning - 10a  . loratadine  10 mg Oral Daily  . metoprolol succinate  25 mg Oral BID  . multivitamin with minerals  1 tablet Oral QHS  . pantoprazole  40 mg Oral Daily  . phenytoin  100 mg Oral 5 X Daily  . simvastatin  20 mg Oral QHS  . tamsulosin  0.4 mg Oral QHS  . vancomycin  1,250 mg Intravenous Q12H  . venlafaxine XR  150 mg Oral QHS   Continuous Infusions: . sodium chloride 75 mL/hr at 05/20/16 1759   PRN Meds:.acetaminophen **OR** acetaminophen, HYDROcodone-acetaminophen, ondansetron **OR** ondansetron (ZOFRAN) IV, senna-docusate     Results for orders   placed or performed during the hospital encounter of 05/18/16 (from the past 48 hour(s))  Comprehensive metabolic panel     Status: Abnormal   Collection Time: 05/19/16  6:32 AM  Result Value Ref Range   Sodium 128 (L) 135 - 145 mmol/L   Potassium 3.6 3.5 - 5.1 mmol/L   Chloride 96 (L) 101 - 111 mmol/L   CO2 24 22 - 32 mmol/L   Glucose, Bld 121 (H) 65 - 99 mg/dL   BUN 20 6 - 20 mg/dL   Creatinine, Ser 0.91 0.61 - 1.24 mg/dL   Calcium 8.2 (L) 8.9 - 10.3 mg/dL   Total Protein 6.9 6.5 - 8.1 g/dL   Albumin 3.6 3.5 - 5.0 g/dL   AST 50 (H) 15 - 41 U/L   ALT 40 17 - 63 U/L   Alkaline Phosphatase 60 38 - 126 U/L   Total Bilirubin 1.0 0.3 - 1.2 mg/dL   GFR calc non Af Amer >60 >60 mL/min   GFR calc Af Amer >60 >60 mL/min    Comment: (NOTE) The eGFR has been calculated using the CKD EPI equation. This calculation has not been validated in all clinical situations. eGFR's persistently <60 mL/min signify possible Chronic Kidney Disease.    Anion gap 8 5 - 15  CBC     Status: Abnormal   Collection Time: 05/19/16  6:32 AM  Result Value Ref Range   WBC 9.9 4.0 - 10.5 K/uL   RBC 3.62 (L) 4.22 - 5.81 MIL/uL   Hemoglobin 11.5 (L) 13.0 - 17.0 g/dL   HCT 32.1 (L)  39.0 - 52.0 %   MCV 88.7 78.0 - 100.0 fL   MCH 31.8 26.0 - 34.0 pg   MCHC 35.8 30.0 - 36.0 g/dL   RDW 12.0 11.5 - 15.5 %   Platelets 216 150 - 400 K/uL  Protime-INR     Status: Abnormal   Collection Time: 05/19/16  6:32 AM  Result Value Ref Range   Prothrombin Time 16.4 (H) 11.4 - 15.2 seconds   INR 1.31   Protime-INR     Status: None   Collection Time: 05/20/16  6:32 AM  Result Value Ref Range   Prothrombin Time 14.3 11.4 - 15.2 seconds   INR 1.11   Lipid panel     Status: Abnormal   Collection Time: 05/20/16  6:32 AM  Result Value Ref Range   Cholesterol 147 0 - 200 mg/dL   Triglycerides 118 <150 mg/dL   HDL 36 (L) >40 mg/dL   Total CHOL/HDL Ratio 4.1 RATIO   VLDL 24 0 - 40 mg/dL   LDL Cholesterol 87 0 - 99 mg/dL    Comment:        Total Cholesterol/HDL:CHD Risk Coronary Heart Disease Risk Table                     Men   Women  1/2 Average Risk   3.4   3.3  Average Risk       5.0   4.4  2 X Average Risk   9.6   7.1  3 X Average Risk  23.4   11.0        Use the calculated Patient Ratio above and the CHD Risk Table to determine the patient's CHD Risk.        ATP III CLASSIFICATION (LDL):  <100     mg/dL   Optimal  100-129  mg/dL   Near or Above  Optimal  130-159  mg/dL   Borderline  160-189  mg/dL   High  >190     mg/dL   Very High   Glucose, CSF     Status: None   Collection Time: 05/20/16 11:50 AM  Result Value Ref Range   Glucose, CSF 53 40 - 70 mg/dL  Protein, CSF     Status: Abnormal   Collection Time: 05/20/16 11:50 AM  Result Value Ref Range   Total  Protein, CSF 123 (H) 15 - 45 mg/dL  CSF cell count with differential     Status: Abnormal   Collection Time: 05/20/16 11:50 AM  Result Value Ref Range   Tube # 4    Color, CSF COLORLESS COLORLESS   Appearance, CSF CLEAR CLEAR   Supernatant NOT INDICATED    RBC Count, CSF 4 (H) 0 /cu mm   WBC, CSF 102 (HH) 0 - 5 /cu mm    Comment: CRITICAL RESULT CALLED TO, READ BACK BY AND VERIFIED  WITH: LIGHTNER,N. AT 3545 ON 05/20/2016 BY BAUGHAM,M.    Segmented Neutrophils-CSF 1 0 - 6 %   Lymphs, CSF 96 (H) 40 - 80 %   Monocyte-Macrophage-Spinal Fluid 3 (L) 15 - 45 %   Eosinophils, CSF 0 0 - 1 %   Other Cells, CSF 0     Comment: PENDING PATHOLOGIST REVIEW  CSF culture     Status: None (Preliminary result)   Collection Time: 05/20/16 11:50 AM  Result Value Ref Range   Specimen Description CSF    Special Requests NONE    Gram Stain      CYTOSPIN SMEAR WBC PRESENT, PREDOMINANTLY MONONUCLEAR NO ORGANISMS SEEN GSRC BULLINS,M. AT 6256 ON 05/20/2016 BY BAUGHAM,M. Performed at [facility]    Culture PENDING    Report Status PENDING     Studies/Results:    The brain MRI is reviewed in person. There is a tiny acute infarcts seen on diffusion images involving the right head of the caudate nuclei. 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 There is increased signal seen in the posterior sulci of the frontal and parietal lobes on FLAIR imaging. Significance is unclear.    Carotid duplex Doppler shows no significant stenosis.  Nathan [name], M.D.  Diplomate, Tax adviser of Psychiatry and Neurology ( Neurology). 05/20/2016, 7:33 PM

## 2016-05-20 NOTE — Progress Notes (Signed)
Speech Language Pathology Evaluation Patient Details Name: ADREN BISSONNETTE MRN: [id number] DOB: 08-20-1945 Today's Date: 05/20/2016 Time: 2000-2038 SLP Time Calculation (min) (ACUTE ONLY): 38 min  Problem List:  Patient Active Problem List   Diagnosis Date Noted  . CVA (cerebral vascular accident) ([place]) 05/20/2016  . Word finding difficulty   . Bacteremia 05/19/2016  . Fever 05/18/2016  . Headache 05/18/2016  . Leukocytosis 05/18/2016  . Hypokalemia 03/22/2013  . Sepsis ([place]) 03/21/2013  . Thrombocytopenia ([place]) 03/21/2013  . Acidosis 03/21/2013  . Tachycardia 03/21/2013  . UTI (lower urinary tract infection) 03/21/2013  . Hyponatremia 03/21/2013  . Seizure ([place])   . PTSD (post-traumatic stress disorder)   . HTN (hypertension)   . GERD (gastroesophageal reflux disease)    Past Medical History:  Past Medical History:  Diagnosis Date  . GERD (gastroesophageal reflux disease)   . HTN (hypertension)   . Hyperlipidemia   . PTSD (post-traumatic stress disorder)   . Renal disorder   . Seizure [facility])    Past Surgical History:  Past Surgical History:  Procedure Laterality Date  . JOINT REPLACEMENT     Left knee, R shoulder   HPI:  The patient is 70 year old white male who presents with 2 week history of severe headaches and fever. He was noted to have a fever 103 today. The headaches are located in the posterior occipital region. Headaches range from 6/10 to a 10 at times. He does not have a baseline history of headaches. The patient was in [place] for a couple weeks on a hunting trip. He tells me that the temperature was cold in the 20s. No insect bite is reported. He does have a history of seizure disorder but apparently has had only one seizure 16 years ago. His he reports that it was quite severe with significant encephalopathy and therefore he was placed on medications. No recurrent seizures. He is on multiple psychotropic medications for posttraumatic stress disorder. No other  symptoms are reported other than having some diarrhea for the last 2 days. He denies chest pain, shortness of breath or GU symptoms. The brain MRI is reviewed in person. There is a tiny acute infarcts seen on diffusion images involving the right head of the caudate nuclei. 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 There is increased signal seen in the posterior sulci of the frontal and parietal lobes on FLAIR imaging. Significance is unclear. 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 The PCR is pending. For now we'll continue with the broad spectrum antibiotics. The CSF analysis seems most consistent however with a viral process and not bacterial infection. We can wait for another day or 2 before considering discontinuation of antibiotics. The questionable bacteremia - Staphylococcus coagulase negative - seems to be most consistent with contaminant. SLE ordered as part of stroke protocol.   Assessment / Plan / Recommendation Clinical Impression  Pt seen at bedside for a cogntive linguistic evaluation due to acute stroke. Pt endorsed changes in thinking, swallowing, and speeech, however reports that it is improving. Oral motor examination was unremarkable, however pt does report difficulty with bolus formation and oral propulsion of solid foods the past few days. He acknowledges xerostomia, which likely has an impact and he benefits from liquid wash during meals. Pt noted to have occasional hesitations and whole word repetition (dysfluency) when speaking rapidly (suspect this is baseline). No dysarthria or word finding difficulties noted. Pt able to follow complex commands and express complex thoughts independently.  He had some difficulty with the 4-item recall task and was only able to recall 2 of 4 words despite cues. He tells me that he typically goes to bed very early and wakes up at 2:45 AM to work out with his wife, returns to bed and  awakens around 7AM to take his dogs out. He reiterates that "working out has always been a huge part of (his) life". No further SLP services indicated at this time. Pt will likely be coming for outpatient PT. If pt notices changes in cognition/communication after discharge, outpatient referral to SLP can be obtained when he comes for PT. Pt in agreement with plan of care.     SLP Assessment  Patient does not need any further Speech Lanaguage Pathology Services    Follow Up Recommendations  None    Frequency and Duration           SLP Evaluation Cognition  Overall Cognitive Status: Within Functional Limits for tasks assessed Arousal/Alertness: Awake/alert Orientation Level: Oriented X4 Memory: Impaired Memory Impairment: Decreased recall of new information (Recalled 2 of 4 words after 8 minutes) Awareness: Appears intact Problem Solving: Appears intact Safety/Judgment: Appears intact       Comprehension  Auditory Comprehension Overall Auditory Comprehension: Appears within functional limits for tasks assessed Yes/No Questions: Within Functional Limits Commands: Within Functional Limits Conversation: Complex Visual Recognition/Discrimination Discrimination: Within Function Limits Reading Comprehension Reading Status: Not tested    Expression Expression Primary Mode of Expression: Verbal Verbal Expression Overall Verbal Expression: Appears within functional limits for tasks assessed Initiation: No impairment Level of Generative/Spontaneous Verbalization: Conversation Repetition: No impairment Naming: No impairment Pragmatics: No impairment Non-Verbal Means of Communication: Not applicable Other Verbal Expression Comments: Mild baseline dysluency? Written Expression Dominant Hand: Right Written Expression: Not tested   Oral / Motor  Oral Motor/Sensory Function Overall Oral Motor/Sensory Function: Within functional limits Motor Speech Overall Motor Speech: Appears  within functional limits for tasks assessed Respiration: Within functional limits Phonation: Normal Resonance: Within functional limits Articulation: Within functional limitis Intelligibility: Intelligible Motor Planning: Witnin functional limits Interfering Components: Premorbid status (? baseline dysfluency) Effective Techniques: Slow rate               Thank you,  Genene Churn, [place]          Gavan Nordby 05/20/2016, 8:50 PM

## 2016-05-20 NOTE — Progress Notes (Signed)
SLP Cancellation Note  Patient Details Name: Nathan [name] MRN: [id number] DOB: October 25, 1945   Cancelled treatment:       Reason Eval/Treat Not Completed: Patient at procedure or test/unavailable; Pt currently working with PT. I spoke with his wife who denies dysphagia and states that his speech is improving. SLP will return later for SLE.  Thank you,  Genene Churn, [place]    [place] 05/20/2016, 10:26 AM

## 2016-05-20 NOTE — Care Management (Addendum)
Patient has elected to stay here at [facility], refusal to transfer form signed and faxed to [facility] and Scientist, research (life [employer]) of [place] notified.  PT has recommended OP PT, patient agreeable. Referral faxed to [facility] OP rehab.

## 2016-05-21 DIAGNOSIS — G030 Nonpyogenic meningitis: Secondary | ICD-10-CM

## 2016-05-21 LAB — ECHOCARDIOGRAM COMPLETE
CHL CUP DOP CALC LVOT VTI: 25.3 cm
CHL CUP MV DEC (S): 356
CHL CUP STROKE VOLUME: 40 mL
E decel time: 356 msec
EERAT: 6.89
FS: 39 % (ref 28–44)
Height: 71 in
IV/PV OW: 0.95
LADIAMINDEX: 1.64 cm/m2
LASIZE: 35 mm
LAVOL: 68.1 mL
LAVOLA4C: 64.2 mL
LAVOLIN: 31.9 mL/m2
LDCA: 3.14 cm2
LEFT ATRIUM END SYS DIAM: 35 mm
LV E/e'average: 6.89
LV PW d: 9.94 mm — AB (ref 0.6–1.1)
LV SIMPSON'S DISK: 65
LV dias vol: 61 mL — AB (ref 62–150)
LV sys vol index: 10 mL/m2
LVDIAVOLIN: 29 mL/m2
LVEEMED: 6.89
LVELAT: 9.36 cm/s
LVOT peak grad rest: 7 mmHg
LVOTD: 20 mm
LVOTPV: 130 cm/s
LVOTSV: 79 mL
LVSYSVOL: 22 mL (ref 21–61)
MV pk A vel: 83.3 m/s
MV pk E vel: 64.5 m/s
RV LATERAL S' VELOCITY: 15.4 cm/s
TAPSE: 22.6 mm
TDI e' lateral: 9.36
TDI e' medial: 5.55
Weight: 3171.1 oz

## 2016-05-21 LAB — HERPES SIMPLEX VIRUS(HSV) DNA BY PCR
HSV 1 DNA: NEGATIVE
HSV 2 DNA: NEGATIVE

## 2016-05-21 LAB — COMPREHENSIVE METABOLIC PANEL
ALBUMIN: 3.2 g/dL — AB (ref 3.5–5.0)
ALK PHOS: 69 U/L (ref 38–126)
ALT: 40 U/L (ref 17–63)
ANION GAP: 6 (ref 5–15)
AST: 37 U/L (ref 15–41)
BUN: 13 mg/dL (ref 6–20)
CALCIUM: 8.2 mg/dL — AB (ref 8.9–10.3)
CHLORIDE: 99 mmol/L — AB (ref 101–111)
CO2: 27 mmol/L (ref 22–32)
Creatinine, Ser: 0.87 mg/dL (ref 0.61–1.24)
GFR calc non Af Amer: >60 mL/min (ref >60)
GLUCOSE: 121 mg/dL — AB (ref 65–99)
POTASSIUM: 3.5 mmol/L (ref 3.5–5.1)
SODIUM: 132 mmol/L — AB (ref 135–145)
Total Bilirubin: 0.5 mg/dL (ref 0.3–1.2)
Total Protein: 6.5 g/dL (ref 6.5–8.1)

## 2016-05-21 LAB — CULTURE, BLOOD (ROUTINE X 2)

## 2016-05-21 LAB — CBC
HCT: 31.2 % — ABNORMAL LOW (ref 39.0–52.0)
HEMOGLOBIN: 11 g/dL — AB (ref 13.0–17.0)
MCH: 31.3 pg (ref 26.0–34.0)
MCHC: 35.3 g/dL (ref 30.0–36.0)
MCV: 88.9 fL (ref 78.0–100.0)
PLATELETS: 212 10*3/uL (ref 150–400)
RBC: 3.51 MIL/uL — ABNORMAL LOW (ref 4.22–5.81)
RDW: 12.1 % (ref 11.5–15.5)
WBC: 9.1 10*3/uL (ref 4.0–10.5)

## 2016-05-21 LAB — URINE CULTURE

## 2016-05-21 LAB — HEMOGLOBIN A1C
HEMOGLOBIN A1C: 5.4 % (ref 4.8–5.6)
MEAN PLASMA GLUCOSE: 108 mg/dL

## 2016-05-21 MED ORDER — ACYCLOVIR SODIUM 50 MG/ML IV SOLN
INTRAVENOUS | Status: AC
  Filled 2016-05-21: qty 10

## 2016-05-21 NOTE — Progress Notes (Signed)
Nathan A. Merlene Laughter, MD     [email or web address]          Nathan [name] is an 70 y.o. male.   Assessment/Plan: 1. The clinical picture seems most consistent with viral meningitis or viral encephalitis.  2. Tiny right caudate nuclei infarct. 3. Seizure disorder well-controlled. 3. Posttraumatic stress disorder on multiple psychotropic medications.   RECOMMENDATION: The patient will be started on acyclovir for possible herpes encephalitis. The PCR is pending. For now we'll continue with the broad spectrum antibiotics. The CSF analysis seems most consistent however with a viral process and not bacterial infection. We can wait for another day or 2 before considering discontinuation of antibiotics. The questionable bacteremia - Staphylococcus coagulase negative - seems to be most consistent with contaminant. Typical stroke workup is pending.     He reports that he is feeling a lot better today. He does not complain of headaches.    GENERAL: Dozing but easily arousable.  HEENT: Neck is supple.  ABDOMEN: soft  EXTREMITIES: No edema. 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   BACK: Normal  SKIN: Normal by inspection.    MENTAL STATUS: He is oriented to place and time. Speech, language and cognition are generally intact. Judgment and insight normal.   CRANIAL NERVES: Pupils are equal, round and reactive to light and accomodation; extra ocular movements are full, there is no significant nystagmus; visual fields are full; upper and lower facial muscles are normal in strength and symmetric, there is no flattening of the nasolabial folds; tongue is midline; uvula is midline; shoulder elevation is normal.  MOTOR: Normal tone, bulk and strength; no pronator drift.  COORDINATION: Left finger to nose is normal, right finger to nose is normal, No rest tremor; no intention tremor; no postural tremor; no bradykinesia.  REFLEXES: Deep  tendon reflexes are symmetrical and normal - except absent left knee.   SENSATION: Normal to light touch and temperature.          Objective: Vital signs in last 24 hours: Temp:  [97.1 F (36.2 C)-98.7 F (37.1 C)] 98.5 F (36.9 C) (10/27 1800) Pulse Rate:  [75-89] 89 (10/27 1800) Resp:  [16-24] 18 (10/27 1800) BP: (121-160)/(53-84) 160/84 (10/27 1800) SpO2:  [95 %-100 %] 99 % (10/27 1800)  Intake/Output from previous day: 10/26 0701 - 10/27 0700 In: 2703 [P.O.:360; I.V.:1575; IV Piggyback:768] Out: 750 [Urine:750] Intake/Output this shift: No intake/output data recorded. Nutritional status: DIET SOFT Room service appropriate? Yes; Fluid consistency: Thin   Lab Results: Results for orders placed or performed during the hospital encounter of 05/18/16 (from the past 48 hour(s))  Protime-INR     Status: None   Collection Time: 05/20/16  6:32 AM  Result Value Ref Range   Prothrombin Time 14.3 11.4 - 15.2 seconds   INR 1.11   Lipid panel     Status: Abnormal   Collection Time: 05/20/16  6:32 AM  Result Value Ref Range   Cholesterol 147 0 - 200 mg/dL   Triglycerides 118 <150 mg/dL   HDL 36 (L) >40 mg/dL   Total CHOL/HDL Ratio 4.1 RATIO   VLDL 24 0 - 40 mg/dL   LDL Cholesterol 87 0 - 99 mg/dL    Comment:        Total Cholesterol/HDL:CHD Risk Coronary Heart Disease Risk Table                     Men   Women  1/2 Average Risk  3.4   3.3  Average Risk       5.0   4.4  2 X Average Risk   9.6   7.1  3 X Average Risk  23.4   11.0        Use the calculated Patient Ratio above and the CHD Risk Table to determine the patient's CHD Risk.        ATP III CLASSIFICATION (LDL):  <100     mg/dL   Optimal  100-129  mg/dL   Near or Above                    Optimal  130-159  mg/dL   Borderline  160-189  mg/dL   High  >190     mg/dL   Very High   Culture, blood (Routine X 2) w Reflex to ID Panel     Status: None (Preliminary result)   Collection Time: 05/20/16   9:00 AM  Result Value Ref Range   Specimen Description BLOOD    Special Requests NONE    Culture NO GROWTH 1 DAY    Report Status PENDING   Culture, blood (Routine X 2) w Reflex to ID Panel     Status: None (Preliminary result)   Collection Time: 05/20/16  9:02 AM  Result Value Ref Range   Specimen Description BLOOD    Special Requests NONE    Culture NO GROWTH 1 DAY    Report Status PENDING   Glucose, CSF     Status: None   Collection Time: 05/20/16 11:50 AM  Result Value Ref Range   Glucose, CSF 53 40 - 70 mg/dL  Protein, CSF     Status: Abnormal   Collection Time: 05/20/16 11:50 AM  Result Value Ref Range   Total  Protein, CSF 123 (H) 15 - 45 mg/dL  CSF cell count with differential     Status: Abnormal   Collection Time: 05/20/16 11:50 AM  Result Value Ref Range   Tube # 4    Color, CSF COLORLESS COLORLESS   Appearance, CSF CLEAR CLEAR   Supernatant NOT INDICATED    RBC Count, CSF 4 (H) 0 /cu mm   WBC, CSF 102 (HH) 0 - 5 /cu mm    Comment: CRITICAL RESULT CALLED TO, READ BACK BY AND VERIFIED WITH: LIGHTNER,N. AT 1632 ON 05/20/2016 BY BAUGHAM,M.    Segmented Neutrophils-CSF 1 0 - 6 %   Lymphs, CSF 96 (H) 40 - 80 %   Monocyte-Macrophage-Spinal Fluid 3 (L) 15 - 45 %   Eosinophils, CSF 0 0 - 1 %   Other Cells, CSF 0     Comment: PENDING PATHOLOGIST REVIEW  CSF culture     Status: None (Preliminary result)   Collection Time: 05/20/16 11:50 AM  Result Value Ref Range   Specimen Description CSF    Special Requests NONE    Gram Stain      CYTOSPIN SMEAR WBC PRESENT, PREDOMINANTLY MONONUCLEAR NO ORGANISMS SEEN [facility] BULLINS,M. AT 1445 ON 05/20/2016 BY BAUGHAM,M. Performed at [facility] < 24 HOURS Performed at [facility]    Report Status PENDING   CBC     Status: Abnormal   Collection Time: 05/21/16  6:33 AM  Result Value Ref Range   WBC 9.1 4.0 - 10.5 K/uL   RBC 3.51 (L) 4.22 - 5.81 MIL/uL   Hemoglobin 11.0 (L) 13.0 -  17.0  g/dL   HCT 31.2 (L) 39.0 - 52.0 %   MCV 88.9 78.0 - 100.0 fL   MCH 31.3 26.0 - 34.0 pg   MCHC 35.3 30.0 - 36.0 g/dL   RDW 12.1 11.5 - 15.5 %   Platelets 212 150 - 400 K/uL  Comprehensive metabolic panel     Status: Abnormal   Collection Time: 05/21/16  6:33 AM  Result Value Ref Range   Sodium 132 (L) 135 - 145 mmol/L   Potassium 3.5 3.5 - 5.1 mmol/L   Chloride 99 (L) 101 - 111 mmol/L   CO2 27 22 - 32 mmol/L   Glucose, Bld 121 (H) 65 - 99 mg/dL   BUN 13 6 - 20 mg/dL   Creatinine, Ser 0.87 0.61 - 1.24 mg/dL   Calcium 8.2 (L) 8.9 - 10.3 mg/dL   Total Protein 6.5 6.5 - 8.1 g/dL   Albumin 3.2 (L) 3.5 - 5.0 g/dL   AST 37 15 - 41 U/L   ALT 40 17 - 63 U/L   Alkaline Phosphatase 69 38 - 126 U/L   Total Bilirubin 0.5 0.3 - 1.2 mg/dL   GFR calc non Af Amer >60 >60 mL/min   GFR calc Af Amer >60 >60 mL/min    Comment: (NOTE) The eGFR has been calculated using the CKD EPI equation. This calculation has not been validated in all clinical situations. eGFR's persistently <60 mL/min signify possible Chronic Kidney Disease.    Anion gap 6 5 - 15    Lipid Panel  Recent Labs  05/20/16 0632  CHOL 147  TRIG 118  HDL 36*  CHOLHDL 4.1  VLDL 24  LDLCALC 87    Studies/Results:   Carotid Dopplers unremarkable.    TTE Study Conclusions  - Left ventricle: The cavity size was normal. Wall thickness was   normal. Systolic function was normal. The estimated ejection   fraction was in the range of 60% to 65%. Wall motion was normal;   there were no regional wall motion abnormalities. Doppler   parameters are consistent with abnormal left ventricular   relaxation (grade 1 diastolic dysfunction). - Aortic valve: Trileaflet; moderately calcified leaflets. Moderate   thickening involving the noncoronary cusp. - Mitral valve: There was trivial regurgitation. - Left atrium: The atrium was at the upper limits of normal in   size. - Right atrium: Central venous pressure (est): 3 mm  Hg. - Atrial septum: No defect or patent foramen ovale was identified. - Tricuspid valve: There was trivial regurgitation. - Pulmonary arteries: Systolic pressure could not be accurately   estimated. - Pericardium, extracardiac: There was no pericardial effusion.  Impressions:  - Normal LV wall thickness with LVEF 60-65% and grade 1 diastolic   dysfunction. Upper normal left atrial chamber size. Trivial   mitral regurgitation. Sclerotic aortic valve with moderate   thickening of the noncoronary cusp. Trivial tricuspid   regurgitation. No obvious valvular vegetations.       Medications:  Scheduled Meds: .  stroke: mapping our early stages of recovery book   Does not apply Once  . acyclovir  10 mg/kg Intravenous Q8H  . ARIPiprazole  20 mg Oral Daily  . aspirin  300 mg Rectal Daily   Or  . aspirin  325 mg Oral Daily  . cefTRIAXone (ROCEPHIN)  IV  2 g Intravenous Q12H  . diazepam  5 mg Oral Daily  . feeding supplement (ENSURE ENLIVE)  237 mL Oral BID BM  . lisinopril  40 mg Oral  q morning - 10a  . loratadine  10 mg Oral Daily  . metoprolol succinate  25 mg Oral BID  . multivitamin with minerals  1 tablet Oral QHS  . pantoprazole  40 mg Oral Daily  . phenytoin  100 mg Oral 5 X Daily  . simvastatin  20 mg Oral QHS  . tamsulosin  0.4 mg Oral QHS  . vancomycin  1,250 mg Intravenous Q12H  . venlafaxine XR  150 mg Oral QHS   Continuous Infusions:  PRN Meds:.acetaminophen **OR** acetaminophen, HYDROcodone-acetaminophen, ondansetron **OR** ondansetron (ZOFRAN) IV, senna-docusate     LOS: 3 days   Izrael Peak A. Merlene [name], M.D.  Diplomate, Tax adviser of Psychiatry and Neurology ( Neurology).

## 2016-05-21 NOTE — Evaluation (Signed)
Occupational Therapy Evaluation Patient Details Name: Nathan [name] MRN: [id number] DOB: 11-11-1945 Today's Date: 05/21/2016    History of Present Illness 70 year old presented with ongoing myalgias, fever, headache for the past few weeks, recently returned from trip to [place], presented with rigors and shaking chills in the emergency department. No meningismus at that time. Lumbar puncture was deferred secondary to elevated INR. CT head was negative. At time of admission meningitis was not suspected. Encephalitis was considered. In the emergency department noted to have some confusion and word finding difficulty. He had further fever and after discussion with infectious disease, recommendation was made to proceed with treatment aimed at meningitis. Blood culture positive but may be spurious. MRI of the brain revealed small stroke of unclear significance. Further stroke evaluation being pursued. Plan for lumbar puncture today.  MRI showed a small stroke last night. Further MRI did not demonstrate any meningeal enhancement.     Clinical Impression   Pt awake, alert, oriented x4 this am, agreeable to OT evaluation. Wife stepped out of room for evaluation. Pt reports independence in ADL and IADL tasks PTA. Pt able to complete ADL tasks during evaluation with increased time and rest breaks due to fatigue. Min assist for sit to stand, min guard for functional mobility around the room. No further OT services recommended at this time, supervision 24/7 recommended for assistance for ADLs as needed.     Follow Up Recommendations  No OT follow up;Supervision/Assistance - 24 hour    Equipment Recommendations  None recommended by OT       Precautions / Restrictions Precautions Precautions: Fall;Other (comment) (droplet isolation-wear surgical mask) Precaution Comments: 1 fall where he hit his head on concrete (7-8 months ago), 2 falls the day he came to the hospital.  Pt states that he looses his  balance when he falls.   Restrictions Weight Bearing Restrictions: No      Mobility Bed Mobility Overal bed mobility: Needs Assistance Bed Mobility: Supine to Sit     Supine to sit: Supervision;HOB elevated        Transfers Overall transfer level: Needs assistance Equipment used: Rolling walker (2 wheeled) Transfers: Sit to/from Stand Sit to Stand: Min assist                   ADL Overall ADL's : Needs assistance/impaired                                     Functional mobility during ADLs: Min guard;Rolling walker General ADL Comments: pt is able to complete ADL tasks with supervision, requires increased time due to fatigue               Pertinent Vitals/Pain Pain Assessment: 0-10 Pain Score: 3  Pain Location: headache Pain Descriptors / Indicators: Headache Pain Intervention(s): Limited activity within patient's tolerance;Monitored during session     Hand Dominance Right   Extremity/Trunk Assessment Upper Extremity Assessment Upper Extremity Assessment: Generalized weakness;Overall [facility] for tasks assessed   Lower Extremity Assessment Lower Extremity Assessment: Defer to PT evaluation       Communication Communication Communication: No difficulties   Cognition Arousal/Alertness: Awake/alert Behavior During Therapy: WFL for tasks assessed/performed Overall Cognitive Status: Within Functional Limits for tasks assessed  Home Living Family/patient expects to be discharged to:: Private residence Living Arrangements: Spouse/significant other Available Help at Discharge: Available 24 hours/day;Family               Bathroom Shower/Tub: Teaching laboratory technician Toilet: Standard     Home Equipment: Cane - single point;Walker - 2 wheels      Lives With: Spouse    Prior Functioning/Environment Level of Independence: Independent  Gait / Transfers Assistance Needed: independent.   ADL's / Homemaking Assistance Needed: independent. driving.             OT Problem List: Decreased activity tolerance    End of Session Equipment Utilized During Treatment: Gait belt;Rolling walker  Activity Tolerance: Patient limited by fatigue Patient left: in chair;with call bell/phone within reach   Time: 1021-1042 OT Time Calculation (min): 21 min Charges:  OT General Charges $OT Visit: 1 Procedure OT Evaluation $OT Eval Low Complexity: 1 Procedure Guadelupe Sabin, OTR/L  [phone] 05/21/2016, 10:46 AM

## 2016-05-21 NOTE — Progress Notes (Signed)
Pt is complaining of severe headaches. Currently we are alternating Tylenol an Vicodin. Medication is only providing minimal and very short relief. Notified on call MD. No new orders at this time.

## 2016-05-21 NOTE — Progress Notes (Signed)
Pharmacy Antibiotic Note  Nathan [name] is a 70 y.o. male admitted on 05/18/2016 with bacteremia and possible meningitis.  Pharmacy has been consulted for vancomycin dosing. Rocephin and acyclovir were started to cover possible meningitis.  Plan: Continue Vancomycin 1250 mg IV q12 hours Continue Rocephin 2gm IV q12hrs Continue Acyclovir 10mg /Kg IV q8hrs F/u renal function, cultures, CSF and clinical course F/U neurology recommendations  Height: 5\' 11"  (180.3 cm) Weight: 198 lb 3.1 oz (89.9 kg) IBW/kg (Calculated) : 75.3  Temp (24hrs), Avg:98.6 F (37 C), Min:98.1 F (36.7 C), Max:99.1 F (37.3 C)   Recent Labs Lab 05/16/16 1125 05/18/16 1111 05/18/16 1119 05/18/16 1442 05/18/16 1654 05/19/16 0632 05/21/16 0633  WBC 13.2* 13.6*  --   --   --  9.9 9.1  CREATININE 1.07 1.05  --   --   --  0.91 0.87  LATICACIDVEN  --   --  1.04 1.3 2.1*  --   --     Estimated Creatinine Clearance: 82.9 mL/min (by C-G formula based on SCr of 0.87 mg/dL).    Allergies  Allergen Reactions  . Terazosin Other (See Comments)    Syncope   Antimicrobials this admission: rocephin 10/24 >>  Acyclovir  10/24 >>  Vancomycin 10/24 >>  Microbiology results: 10/24 BCx: 1/2 gpc 10/24 UCx:    Recent Results (from the past 240 hour(s))  Urine culture     Status: Abnormal   Collection Time: 05/16/16  1:44 PM  Result Value Ref Range Status   Specimen Description URINE, CLEAN CATCH  Final   Special Requests NONE  Final   Culture MULTIPLE SPECIES PRESENT, SUGGEST RECOLLECTION (A)  Final   Report Status 05/17/2016 FINAL  Final  Urine culture     Status: Abnormal   Collection Time: 05/18/16 10:08 AM  Result Value Ref Range Status   Specimen Description URINE, RANDOM  Final   Special Requests NONE  Final   Culture 30,000 COLONIES/mL MORGANELLA MORGANII (A)  Final   Report Status 05/21/2016 FINAL  Final   Organism ID, Bacteria MORGANELLA MORGANII (A)  Final      Susceptibility   Morganella  morganii - MIC*    AMPICILLIN >=32 RESISTANT Resistant     CEFAZOLIN >=64 RESISTANT Resistant     CEFTRIAXONE <=1 SENSITIVE Sensitive     CIPROFLOXACIN <=0.25 SENSITIVE Sensitive     GENTAMICIN <=1 SENSITIVE Sensitive     IMIPENEM 1 SENSITIVE Sensitive     NITROFURANTOIN 64 RESISTANT Resistant     TRIMETH/SULFA <=20 SENSITIVE Sensitive     AMPICILLIN/SULBACTAM >=32 RESISTANT Resistant     PIP/TAZO <=4 SENSITIVE Sensitive     * 30,000 COLONIES/mL MORGANELLA MORGANII  Blood culture (routine x 2)     Status: Abnormal   Collection Time: 05/18/16 11:11 AM  Result Value Ref Range Status   Specimen Description BLOOD LEFT ANTECUBITAL DRAWN BY RN  Final   Special Requests BOTTLES DRAWN AEROBIC AND ANAEROBIC 5CC EACH  Final   Culture  Setup Time   Final    GRAM POSITIVE COCCI IN PAIRS RECOVERED FROM THE AEROBIC BOTTLE Gram Stain Report Called to,Read Back By and Verified With: EVERETT,R. AT 1211 ON 05/19/2016 BY BAUGHAM,M. Performed at [facility], READ BACK BY AND VERIFIED WITH: C. THOMAS, RN ([facility]) AT 2005 ON 05/19/16 BY C. JESSUP, MLT    Culture (A)  Final    STAPHYLOCOCCUS SPECIES (COAGULASE NEGATIVE) THE SIGNIFICANCE OF ISOLATING THIS ORGANISM FROM A SINGLE SET OF  BLOOD CULTURES WHEN MULTIPLE SETS ARE DRAWN IS UNCERTAIN. PLEASE NOTIFY THE MICROBIOLOGY DEPARTMENT WITHIN ONE WEEK IF SPECIATION AND SENSITIVITIES ARE REQUIRED. Performed at [facility]    Report Status 05/21/2016 FINAL  Final  Blood Culture ID Panel (Reflexed)     Status: Abnormal   Collection Time: 05/18/16 11:11 AM  Result Value Ref Range Status   Enterococcus species NOT DETECTED NOT DETECTED Final   Listeria monocytogenes NOT DETECTED NOT DETECTED Final   Staphylococcus species DETECTED (A) NOT DETECTED Final    Comment: CRITICAL RESULT CALLED TO, READ BACK BY AND VERIFIED WITH: C. THOMAS, RN ([facility]) AT 2005 ON 05/19/16 BY C. JESSUP, MLT.    Staphylococcus aureus NOT DETECTED NOT  DETECTED Final   Methicillin resistance DETECTED (A) NOT DETECTED Final    Comment: CRITICAL RESULT CALLED TO, READ BACK BY AND VERIFIED WITH: C. THOMAS, RN ([facility]) AT 2005 ON 05/19/16 BY C. JESSUP, MLT.    Streptococcus species NOT DETECTED NOT DETECTED Final   Streptococcus agalactiae NOT DETECTED NOT DETECTED Final   Streptococcus pneumoniae NOT DETECTED NOT DETECTED Final   Streptococcus pyogenes NOT DETECTED NOT DETECTED Final   Acinetobacter baumannii NOT DETECTED NOT DETECTED Final   Enterobacteriaceae species NOT DETECTED NOT DETECTED Final   Enterobacter cloacae complex NOT DETECTED NOT DETECTED Final   Escherichia coli NOT DETECTED NOT DETECTED Final   Klebsiella oxytoca NOT DETECTED NOT DETECTED Final   Klebsiella pneumoniae NOT DETECTED NOT DETECTED Final   Proteus species NOT DETECTED NOT DETECTED Final   Serratia marcescens NOT DETECTED NOT DETECTED Final   Haemophilus influenzae NOT DETECTED NOT DETECTED Final   Neisseria meningitidis NOT DETECTED NOT DETECTED Final   Pseudomonas aeruginosa NOT DETECTED NOT DETECTED Final   Candida albicans NOT DETECTED NOT DETECTED Final   Candida glabrata NOT DETECTED NOT DETECTED Final   Candida krusei NOT DETECTED NOT DETECTED Final   Candida parapsilosis NOT DETECTED NOT DETECTED Final   Candida tropicalis NOT DETECTED NOT DETECTED Final    Comment: Performed at [facility]  Blood culture (routine x 2)     Status: None (Preliminary result)   Collection Time: 05/18/16 11:20 AM  Result Value Ref Range Status   Specimen Description BLOOD RIGHT ANTECUBITAL  Final   Special Requests BOTTLES DRAWN AEROBIC AND ANAEROBIC 10CC EACH  Final   Culture NO GROWTH 3 DAYS  Final   Report Status PENDING  Incomplete  Culture, blood (Routine X 2) w Reflex to ID Panel     Status: None (Preliminary result)   Collection Time: 05/20/16  9:00 AM  Result Value Ref Range Status   Specimen Description BLOOD  Final   Special Requests NONE  Final    Culture NO GROWTH 1 DAY  Final   Report Status PENDING  Incomplete  Culture, blood (Routine X 2) w Reflex to ID Panel     Status: None (Preliminary result)   Collection Time: 05/20/16  9:02 AM  Result Value Ref Range Status   Specimen Description BLOOD  Final   Special Requests NONE  Final   Culture NO GROWTH 1 DAY  Final   Report Status PENDING  Incomplete  CSF culture     Status: None (Preliminary result)   Collection Time: 05/20/16 11:50 AM  Result Value Ref Range Status   Specimen Description CSF  Final   Special Requests NONE  Final   Gram Stain   Final    CYTOSPIN SMEAR WBC PRESENT, PREDOMINANTLY MONONUCLEAR NO ORGANISMS  SEEN [place] BULLINS,M. AT [id number] ON 05/20/2016 BY BAUGHAM,M. Performed at [facility]    Culture PENDING  Incomplete   Report Status PENDING  Incomplete   Thank you for allowing pharmacy to be a part of this patient's care.  Hart Robinsons, PharmD Clinical Pharmacist Pager:  [phone] 05/21/2016   05/21/2016 10:05 AM

## 2016-05-21 NOTE — Progress Notes (Addendum)
PROGRESS NOTE  Nathan [name] [id number] DOB: 04/23/1946 DOA: 05/18/2016 PCP: PROVIDER NOT IN SYSTEM  Brief Narrative: 70 year old presented with ongoing myalgias, fever, headache for the past few weeks, recently returned from trip to [place], presented with rigors and shaking chills in the emergency department. No meningismus at that time. Lumbar puncture was deferred secondary to elevated INR. CT head was negative. At time of admission meningitis was not suspected. Encephalitis was considered. In the emergency department noted to have some confusion and word finding difficulty. He had further fever and after discussion with infectious disease, recommendation was made to proceed with treatment aimed at meningitis. Blood culture positive but may be spurious. MRI of the brain revealed small stroke of unclear significance. Further stroke evaluation being pursued. Plan for lumbar puncture today.  Assessment/Plan: 1. Aseptic meningitis, predominantly lymphocytes, consistent with viral meningitis based on subacute presentation and lack of toxicity. Culture negative thus far. He is afebrile now greater than 24 hours and is improving daily. Herpes simplex virus PCR pending but doubted at this point given the patient clinically improved prior to admission initiation of acyclovir. 2. 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 No symptoms other than mild hesitancy in speech. Carotid ultrasound and 2-D echocardiogram unremarkable. Continue aspirin, statin. Follow-up neurology recommendations. 3. Hypovolemic hyponatremia continues to improve with fluids. 4. PTSD, stable, continue Abilify and Valium. 5. Seizure disorder, stable. Continue for the time. 6. PMH RLE DVT 2014. On warfarin. INR has been reversed.   Much improved. Speech is much improved. Now afebrile greater than 24 hours.  Most likely a viral meningitis. Will follow-up culture data, repeat blood cultures, herpes simplex virus PCR.    If he continues to improve anticipate discharge 10/28.   DVT prophylaxis: SCDs Code Status: full code Family Communication: none Disposition Plan: home  Murray Hodgkins, MD  [facility] Direct contact: [phone] --[place]ia [place]  --[email or web address]; password [email or web address]  7PM-7AM contact night coverage as above 05/21/2016, 3:34 PM  LOS: 3 days   Consultants:    Procedures:  Echo Impressions:  - Normal LV wall thickness with LVEF 60-65% and grade 1 diastolic   dysfunction. Upper normal left atrial chamber size. Trivial   mitral regurgitation. Sclerotic aortic valve with moderate   thickening of the noncoronary cusp. Trivial tricuspid   regurgitation. No obvious valvular vegetations.  Antimicrobials:  Vancomycin 10/24 >>  Ceftriaxone 10/25 >>  Acyclovir 10/2 >>  CC: f/u fever  Interval history/Subjective: Continues to feel better. No word finding difficulties. Headache improved.  Objective: Vitals:   05/20/16 2210 05/21/16 0210 05/21/16 1000 05/21/16 1400  BP: (!) 121/53 139/78 133/70 124/70  Pulse: 84 76 88 75  Resp: 20 (!) 24 18 16   Temp: 98.7 F (37.1 C) 98.4 F (36.9 C) 97.1 F (36.2 C) 98.3 F (36.8 C)  TempSrc: Oral Oral Oral Oral  SpO2: 95% 96% 97% 100%  Weight:      Height:        Intake/Output Summary (Last 24 hours) at 05/21/16 1534 Last data filed at 05/21/16 0940  Gross per 24 hour  Intake             1973 ml  Output              300 ml  Net             1673 ml     Filed Weights   05/18/16 1007 05/18/16 1522  Weight: 94.3 kg (208 lb) 89.9 kg (198 lb 3.1  oz)    Exam: Constitutional:   Appears better, calm and comfortable sitting in chair Respiratory:   Clear to auscultation bilaterally. No wheezes, rales or rhonchi.  Normal respiratory effort. Cardiovascular:   Regular rate and rhythm. No murmur, rub or gallop.  No lower extremity edema. Neurologic:   Grossly normal. Psychiatric:   Judgment, mood, affect,  insight appear grossly normal    I have personally reviewed following labs and imaging studies:  CMP, CBC unremarkable  CSF culture with WBC, no organisms, no growth to date; 102 WBC, 4 RBC, clear  Echocardiogram reviewed  Scheduled Meds: .  stroke: mapping our early stages of recovery book   Does not apply Once  . acyclovir  10 mg/kg Intravenous Q8H  . ARIPiprazole  20 mg Oral Daily  . aspirin  300 mg Rectal Daily   Or  . aspirin  325 mg Oral Daily  . cefTRIAXone (ROCEPHIN)  IV  2 g Intravenous Q12H  . diazepam  5 mg Oral Daily  . feeding supplement (ENSURE ENLIVE)  237 mL Oral BID BM  . lisinopril  40 mg Oral q morning - 10a  . loratadine  10 mg Oral Daily  . metoprolol succinate  25 mg Oral BID  . multivitamin with minerals  1 tablet Oral QHS  . pantoprazole  40 mg Oral Daily  . phenytoin  100 mg Oral 5 X Daily  . simvastatin  20 mg Oral QHS  . tamsulosin  0.4 mg Oral QHS  . vancomycin  1,250 mg Intravenous Q12H  . venlafaxine XR  150 mg Oral QHS   Continuous Infusions:    Principal Problem:   Aseptic meningitis Active Problems:   HTN (hypertension)   GERD (gastroesophageal reflux disease)   Hyponatremia   Fever   Headache   Leukocytosis   Bacteremia   CVA (cerebral vascular accident) ([place])   Word finding difficulty   LOS: 3 days

## 2016-05-22 NOTE — Progress Notes (Signed)
PROGRESS NOTE  ESSEY BRESSLER [id number] DOB: 1946-03-16 DOA: 05/18/2016 PCP: PROVIDER NOT IN SYSTEM  Brief Narrative: 70 year old presented with ongoing myalgias, fever, headache for the past few weeks, recently returned from trip to [place], presented with rigors and shaking chills in the emergency department. No meningismus at that time. Lumbar puncture was deferred secondary to elevated INR. CT head was negative. At time of admission meningitis was not suspected. Encephalitis was considered. In the emergency department noted to have some confusion and word finding difficulty. He had further fever and after discussion with infectious disease, recommendation was made to proceed with treatment aimed at meningitis. Blood culture positive but may be spurious. MRI of the brain revealed small stroke of unclear significance. Further stroke evaluation being pursued. Lumbar puncture on 10/26 showed no growth.  Assessment/Plan: 1. Aseptic meningitis, predominantly lymphocytes, consistent with viral meningitis based on subacute presentation and lack of toxicity. Cultures remain negative thus far. Herpes simplex virus PCR came back negative.  2. 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 No symptoms other than mild hesitancy in speech. Carotid ultrasound and 2-D echocardiogram unremarkable. Continue statin. Resume warfarin on discharge. 3. Hypovolemic hyponatremia treated with IV fluids. 4. PTSD, remained stable, continue Abilify and Valium. 5. Seizure disorder, remained stable. Continue phenytoin. 6. PMH RLE DVT 2014. On warfarin as an outpatient. INR has been reversed.   Overall improved. Continue IV abx at this time. If cultures are negative tomorrow, will discontinue abx.  Anticipate discharge home tomorrow.   DVT prophylaxis: SCDs Code Status: full code Family Communication: Updated wife by telephone Disposition Plan: home  Murray Hodgkins, MD  [facility] Direct  contact: [phone] --Via amion app [place]  --[email or web address]; password [email or web address]  7PM-7AM contact night coverage as above 05/22/2016, 8:09 AM  LOS: 4 days   Consultants:  Neurology  Speech pathology  Nutrition  Procedures:  Echo Impressions:  - Normal LV wall thickness with LVEF 60-65% and grade 1 diastolic   dysfunction. Upper normal left atrial chamber size. Trivial   mitral regurgitation. Sclerotic aortic valve with moderate   thickening of the noncoronary cusp. Trivial tricuspid   regurgitation. No obvious valvular vegetations.  Antimicrobials:  Vancomycin 10/24 >>  Ceftriaxone 10/25 >>  Acyclovir 10/26 >> 10/28  CC: f/u fever  Interval history/Subjective: He is feeling much better today. He is speaking much better but it is not completely back to normal. He denies nausea and vomiting. He also denies any difficulty swallowing. Headache has resolved.  Objective: Vitals:   05/21/16 1800 05/21/16 2200 05/22/16 0200 05/22/16 0600  BP: (!) 160/84 (!) 143/69 (!) 141/67 138/65  Pulse: 89 (!) 102 89 85  Resp: 18 17 15 14   Temp: 98.5 F (36.9 C) 100 F (37.8 C) 99.3 F (37.4 C) 98.9 F (37.2 C)  TempSrc: Oral Oral Oral Oral  SpO2: 99% 94% 93% 98%  Weight:      Height:        Intake/Output Summary (Last 24 hours) at 05/22/16 0809 Last data filed at 05/22/16 [id number]  Gross per 24 hour  Intake             1906 ml  Output             1000 ml  Net              906 ml     Filed Weights   05/18/16 1007 05/18/16 1522  Weight: 94.3 kg (208 lb) 89.9 kg (198 lb 3.1 oz)  Exam:  Constitutional:  . Appears calm and comfortable Respiratory:  . CTA bilaterally, no w/r/r.  . Respiratory effort normal. No retractions or accessory muscle use Cardiovascular:  . RRR, no m/r/g . No LE extremity edema   . Telemetry SR Psychiatric:  o Speech is fluent and clear   I have personally reviewed following labs and imaging studies:  CSF shows no growth at this time  HSV PCR  of the spinal fluid was negative.  Repeat blood cultures pending, no growth to date  Scheduled Meds: .  stroke: mapping our early stages of recovery book   Does not apply Once  . ARIPiprazole  20 mg Oral Daily  . aspirin  300 mg Rectal Daily   Or  . aspirin  325 mg Oral Daily  . cefTRIAXone (ROCEPHIN)  IV  2 g Intravenous Q12H  . diazepam  5 mg Oral Daily  . feeding supplement (ENSURE ENLIVE)  237 mL Oral BID BM  . lisinopril  40 mg Oral q morning - 10a  . loratadine  10 mg Oral Daily  . metoprolol succinate  25 mg Oral BID  . multivitamin with minerals  1 tablet Oral QHS  . pantoprazole  40 mg Oral Daily  . phenytoin  100 mg Oral 5 X Daily  . simvastatin  20 mg Oral QHS  . tamsulosin  0.4 mg Oral QHS  . vancomycin  1,250 mg Intravenous Q12H  . venlafaxine XR  150 mg Oral QHS   Continuous Infusions:    Principal Problem:   Aseptic meningitis Active Problems:   HTN (hypertension)   GERD (gastroesophageal reflux disease)   Hyponatremia   Fever   Headache   Leukocytosis   Bacteremia   CVA (cerebral vascular accident) ([place])   Word finding difficulty   LOS: 4 days       By signing my name below, I, Hilbert Odor, attest that this documentation has been prepared under the direction and in the presence of Salome Hautala P. Sarajane Jews, MD. Electronically signed: Hilbert Odor, Scribe.  05/22/16, 9:17 AM  I personally performed the services described in this documentation. All medical record entries made by the scribe were at my direction. I have reviewed the chart and agree that the record reflects my personal performance and is accurate and complete. Murray Hodgkins, MD

## 2016-05-23 LAB — CULTURE, BLOOD (ROUTINE X 2): Culture: NO GROWTH

## 2016-05-23 LAB — RAPID URINE DRUG SCREEN, HOSP PERFORMED
AMPHETAMINES: NOT DETECTED
Barbiturates: NOT DETECTED
Benzodiazepines: POSITIVE — AB
Cocaine: NOT DETECTED
Opiates: NOT DETECTED
TETRAHYDROCANNABINOL: NOT DETECTED

## 2016-05-23 NOTE — Progress Notes (Signed)
PROGRESS NOTE  NARVELL TOLLIS [id number] DOB: Jul 07, 1946 DOA: 05/18/2016 PCP: Benito Mccreedy, MD  Brief Narrative: 70 year old presented with ongoing myalgias, fever, headache for the past few weeks, recently returned from trip to [place], presented with rigors and shaking chills in the emergency department. No meningismus at that time. Lumbar puncture was deferred secondary to elevated INR. CT head was negative. At time of admission meningitis was not suspected. Encephalitis was considered. In the emergency department noted to have some confusion and word finding difficulty. He had further fever and after discussion with infectious disease, recommendation was made to proceed with treatment aimed at meningoencephalitis. Lumbar puncture was performed and studies were consistent with viral meningitis, as supported by clinical course and presentation. HSV PCR was negative. CSF culture is -3 days now. No organisms seen on Gram stain. He has remained afebrile for 72 hours. All studies and course consistent with viral meningitis. Slight hesitation with speech has improved dramatically and is likely secondary to encephalitis. MRI of the brain was negative for meningeal enhancement but did reveal a punctate infarct. Patient was seen by neurology. Patient will continue warfarin for history of DVT, and statin has been added.   Assessment/Plan: 1. Aseptic meningitis, predominantly lymphocytes, consistent with viral meningitis based on subacute presentation and lack of toxicity. Cultures remain negative thus far. Herpes simplex virus PCRnegative.  2. 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 No symptoms other than mild hesitancy in speech. Carotid ultrasound and 2-D echocardiogram unremarkable. Continue statin. Resume warfarin on discharge. 3. PTSD, stable, continue Abilify and Valium. 4. Seizure disorder, has remained stable. Continue phenytoin. 5. PMH RLE DVT 2014.  INR was reversed to obtain  lumbar puncture. plan to resume warfarin on discharge.   Continues to improve. All studies consistent with viral meningitis. Stop antibiotics.  Discharge home  Follow up with PCP 1-2 weeks and with neurology in 2 months  Outpatient PT   Murray Hodgkins, MD  [facility] Direct contact: [phone] --Via [place]  --[email or web address]; password [email or web address]  7PM-7AM contact night coverage as above 05/23/2016, 6:30 AM  LOS: 5 days   Consultants:  Neurology  Speech pathology  Nutrition  Procedures:  Echo Impressions:  - Normal LV wall thickness with LVEF 60-65% and grade 1 diastolic   dysfunction. Upper normal left atrial chamber size. Trivial   mitral regurgitation. Sclerotic aortic valve with moderate   thickening of the noncoronary cusp. Trivial tricuspid   regurgitation. No obvious valvular vegetations.  Antimicrobials:  Vancomycin 10/24 >>  Ceftriaxone 10/25 >>  Acyclovir 10/26 >> 10/28  CC: f/u fever  Interval history/Subjective:  Feels significantly better. No headache. Speaking well. No new issues.  Objective: Vitals:   05/22/16 1800 05/22/16 2200 05/23/16 0200 05/23/16 0600  BP: 134/67 131/71 133/68 (!) 146/79  Pulse: 77 70 75 87  Resp: 18 20 13 18   Temp: 98.5 F (36.9 C) 98 F (36.7 C) 99.2 F (37.3 C) 99 F (37.2 C)  TempSrc: Oral Oral Oral Oral  SpO2: 100% 100% 97% 92%  Weight:      Height:        Intake/Output Summary (Last 24 hours) at 05/23/16 0630 Last data filed at 05/23/16 0539  Gross per 24 hour  Intake             1080 ml  Output             1450 ml  Net             -370 ml  Filed Weights   05/18/16 1007 05/18/16 1522  Weight: 94.3 kg (208 lb) 89.9 kg (198 lb 3.1 oz)    Exam: Constitutional:   Appears well, calm, comfortable Eyes:   Pupils equal, round, reactive to light Neck:   No pain. Range of motion appears normal. Respiratory:   Clear to auscultation bilaterally. No wheezes, rales or  rhonchi.  Normal respiratory effort. Cardiovascular:   Regular rate and rhythm. No murmur, rub or gallop.  No lower extremity edema. Musculoskeletal:   Moves all extremities well. Grossly normal tone and strength. Neurologic:   Grossly nonfocal. Slight intentional tremor to arms. Psychiatric:   Alert, speech fluent and appropriate.  I have personally reviewed following labs and imaging studies:  Repeat blood cultures no growth today.  CSF culture no growth to date 3 days  Scheduled Meds: .  stroke: mapping our early stages of recovery book   Does not apply Once  . ARIPiprazole  20 mg Oral Daily  . aspirin  300 mg Rectal Daily   Or  . aspirin  325 mg Oral Daily  . cefTRIAXone (ROCEPHIN)  IV  2 g Intravenous Q12H  . diazepam  5 mg Oral Daily  . feeding supplement (ENSURE ENLIVE)  237 mL Oral BID BM  . lisinopril  40 mg Oral q morning - 10a  . loratadine  10 mg Oral Daily  . metoprolol succinate  25 mg Oral BID  . multivitamin with minerals  1 tablet Oral QHS  . pantoprazole  40 mg Oral Daily  . phenytoin  100 mg Oral 5 X Daily  . simvastatin  20 mg Oral QHS  . tamsulosin  0.4 mg Oral QHS  . vancomycin  1,250 mg Intravenous Q12H  . venlafaxine XR  150 mg Oral QHS   Continuous Infusions:    Principal Problem:   Aseptic meningitis Active Problems:   HTN (hypertension)   GERD (gastroesophageal reflux disease)   Hyponatremia   Fever   Headache   Leukocytosis   Bacteremia   CVA (cerebral vascular accident) ([place])   Word finding difficulty   LOS: 5 days    By signing my name below, I, Hilbert Odor, attest that this documentation has been prepared under the direction and in the presence of Loy Little P. Sarajane Jews, MD. Electronically signed: Hilbert Odor, Scribe.  05/23/16, 9:42 AM  I personally performed the services described in this documentation. All medical record entries made by the scribe were at my direction. I have reviewed the chart and agree that  the record reflects my personal performance and is accurate and complete. Murray Hodgkins, MD

## 2016-05-23 NOTE — Discharge Summary (Addendum)
Physician Discharge Summary  Nathan [name] [id number] DOB: 06-27-1946 DOA: 05/18/2016  PCP: Benito Mccreedy, MD  Admit date: 05/18/2016 Discharge date: 05/23/2016  Recommendations for Outpatient Follow-up:  1. Resolution of aseptic meningitis 2. Follow-up punctate CVA, either in the community or with neurology as recommended by PCP 3. Follow-up PT/INR, warfarin restarted on discharge   Follow-up Information    Nathan, [name], MD. Schedule an appointment as soon as possible for a visit in 1 week(s).   Specialty:  Family Medicine Contact information: [place] [address] [phone]        Phillips Odor, MD. Schedule an appointment as soon as possible for a visit in 2 month(s).   Specialty:  Neurology Contact information: [address] [place] [address] [phone]          Discharge Diagnoses:  1. Aseptic meningitis, viral 2. 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 3. PTSD 4. PMH RLE DVT 2014  Discharge Condition: improved Disposition: home  Diet recommendation: regular  Filed Weights   05/18/16 1007 05/18/16 1522  Weight: 94.3 kg (208 lb) 89.9 kg (198 lb 3.1 oz)    History of present illness:  70 year old presented with ongoing myalgias, fever, headache for the past few weeks, recently returned from trip to [place], presented with rigors and shaking chills in the emergency department as well as word finding difficulty. No meningismus at that time. Lumbar puncture was deferred secondary to elevated INR. CT head was negative. At time of admission meningitis was not suspected. Encephalitis was considered.   Hospital Course:  He had further fever and after discussion with infectious disease, recommendation was made to proceed with treatment aimed at meningoencephalitis. Lumbar puncture was performed and studies were consistent with viral meningitis, as supported by clinical course and presentation. HSV PCR  was negative. CSF culture NGTD x3 days No organisms seen on Gram stain. Case was discussed with infectious disease, with predominant lymphocytosis, and is felt antibiotics could be stopped if cultures remain negative. He has remained afebrile for 72 hours. 1/2 bottles were positive for bacteremia, however this was found to be contaminant. Repeat blood cultures no growth today. Echocardiogram was reassuring. All studies and course consistent with viral meningitis. Slight hesitation with speech has improved dramatically and is likely secondary to encephalitis. MRI of the brain was negative for meningeal enhancement but did reveal a punctate infarct. Patient was seen by neurology. Patient will continue warfarin for history of DVT, and statin has been added.   1. Aseptic meningitis, predominantly lymphocytes, consistent with viral meningitis based on subacute presentation and lack of toxicity. Cultures remain negative thus far. Herpes simplex virus PCR negative.  2. 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 No symptoms other than mild hesitancy in speech. Carotid ultrasound and 2-D echocardiogram unremarkable. Continue statin. Resume warfarin on discharge. 3. PTSD, stable, continue Abilify and Valium. 4. Seizure disorder, has remained stable. Continue phenytoin. 5. PMH RLE DVT 2014. INR was reversed to obtain lumbar puncture. plan to resume warfarin on discharge.  Consultants:  Neurology  Speech pathology  Nutrition  Procedures:  Echo Impressions:  - Normal LV wall thickness with LVEF 60-65% and grade 1 diastolic dysfunction. Upper normal left atrial chamber size. Trivial mitral regurgitation. Sclerotic aortic valve with moderate thickening of the noncoronary cusp. Trivial tricuspid regurgitation. No obvious valvular vegetations.  Antimicrobials:  Vancomycin 10/24 >> 10/29  Ceftriaxone 10/25 >> 10/29  Acyclovir 10/26 >> 10/28  Discharge  Instructions  Discharge Instructions  Ambulatory referral to Physical Therapy    Complete by:  As directed    Diet general    Complete by:  As directed    Discharge instructions    Complete by:  As directed    Call your physician or seek immediate medical attention for fever, confusion, difficulty speaking, difficulty swallowing, weakness, numbness, headache or worsening of condition. Recommend seeing a neurologist in 2 months. This can be arranged by your medical doctor.   Increase activity slowly    Complete by:  As directed        Medication List    STOP taking these medications   doxycycline 100 MG capsule Commonly known as:  VIBRAMYCIN   meloxicam 15 MG tablet Commonly known as:  MOBIC     TAKE these medications   ARIPiprazole 20 MG tablet Commonly known as:  ABILIFY Take 20 mg by mouth daily.   cetirizine 10 MG tablet Commonly known as:  ZYRTEC Take 10 mg by mouth daily.   diazepam 5 MG tablet Commonly known as:  VALIUM Take 5 mg by mouth daily.   lisinopril 40 MG tablet Commonly known as:  PRINIVIL,ZESTRIL Take 40 mg by mouth every morning.   metoprolol succinate 25 MG 24 hr tablet Commonly known as:  TOPROL-XL Take 25 mg by mouth 2 (two) times daily.   multivitamin with minerals Tabs tablet Take 1 tablet by mouth at bedtime.   omeprazole 20 MG capsule Commonly known as:  PRILOSEC Take 20 mg by mouth daily.   phenytoin 100 MG ER capsule Commonly known as:  DILANTIN Take 100 mg by mouth 5 (five) times daily.   simvastatin 40 MG tablet Commonly known as:  ZOCOR Take 20 mg by mouth at bedtime.   tamsulosin 0.4 MG Caps capsule Commonly known as:  FLOMAX Take 0.4 mg by mouth at bedtime.   venlafaxine XR 150 MG 24 hr capsule Commonly known as:  EFFEXOR-XR Take 150 mg by mouth at bedtime.   warfarin 5 MG tablet Commonly known as:  COUMADIN Take 5-7.5 mg by mouth See admin instructions. Take 5 mg daily, except take 7.5 mg on Mondays.    zolpidem 10 MG tablet Commonly known as:  AMBIEN Take 10 mg by mouth at bedtime as needed for sleep. Max 3-5 tablets per week.      Allergies  Allergen Reactions  . Terazosin Other (See Comments)    Syncope    The results of significant diagnostics from this hospitalization (including imaging, microbiology, ancillary and laboratory) are listed below for reference.    Significant Diagnostic Studies: Dg Chest 2 View  Result Date: 05/18/2016 CLINICAL DATA:  Fever. EXAM: CHEST  2 VIEW COMPARISON:  Radiographs of May 16, 2016. FINDINGS: The heart size and mediastinal contours are within normal limits. Both lungs are clear. No pneumothorax or pleural effusion is noted. The visualized skeletal structures are unremarkable. IMPRESSION: No active cardiopulmonary disease. Electronically Signed   By: Marijo Conception, M.D.   On: 05/18/2016 11:41   Dg Chest 2 View  Result Date: 05/16/2016 CLINICAL DATA:  Pt states he recently was in [place] on a hunting trip and when he came home he began to have nausea, headache, dizziness and unable to eat, EXAM: CHEST  2 VIEW COMPARISON:  01/26/2015 FINDINGS: Cardiomediastinal silhouette is within normal limits. The lungs free of focal consolidations and pleural effusions. No pulmonary edema. Degenerative changes are seen in the mid and lower thoracic spine. IMPRESSION: No evidence for acute cardio pulmonary  abnormality. Electronically Signed   By: Nolon Nations M.D.   On: 05/16/2016 12:29   Ct Head Wo Contrast  Result Date: 05/18/2016 CLINICAL DATA:  Generalized body aches, weakness EXAM: CT HEAD WITHOUT CONTRAST TECHNIQUE: Contiguous axial images were obtained from the base of the skull through the vertex without intravenous contrast. COMPARISON:  05/16/2016 FINDINGS: Brain: No evidence of acute infarction, hemorrhage, extra-axial collection, ventriculomegaly, or mass effect. Generalized cerebral atrophy. Periventricular white matter low attenuation likely  secondary to microangiopathy. Vascular: Cerebrovascular atherosclerotic calcifications are noted. Skull: Negative for fracture or focal lesion. Sinuses/Orbits: Visualized portions of the orbits are unremarkable. Visualized portions of the paranasal sinuses and mastoid air cells are unremarkable. Other: None. IMPRESSION: No acute intracranial pathology. Electronically Signed   By: Kathreen Devoid   On: 05/18/2016 12:06   Ct Head Wo Contrast  Result Date: 05/16/2016 CLINICAL DATA:  posterior headache, with generalized weakness, decreased appetite and cough. Hx of seizures, htn EXAM: CT HEAD WITHOUT CONTRAST TECHNIQUE: Contiguous axial images were obtained from the base of the skull through the vertex without intravenous contrast. COMPARISON:  03/21/2013 FINDINGS: Brain: Mild periventricular white matter changes are consistent with small vessel disease. There is no intra or extra-axial fluid collection or mass lesion. The basilar cisterns and ventricles have a normal appearance. There is no CT evidence for acute infarction or hemorrhage. Vascular: There is atherosclerotic calcification of the internal carotid arteries. Skull: Normal. Negative for fracture or focal lesion. Sinuses/Orbits: No acute finding. Other: None IMPRESSION: 1. Small vessel disease. 2.  No evidence for acute intracranial abnormality. Electronically Signed   By: Nolon Nations M.D.   On: 05/16/2016 11:53   Mr Brain Wo Contrast  Result Date: 05/19/2016 CLINICAL DATA:  Difficulty speaking.  Word-finding difficulty. EXAM: MRI HEAD WITHOUT CONTRAST TECHNIQUE: Multiplanar, multiecho pulse sequences of the brain and surrounding structures were obtained without intravenous contrast. COMPARISON:  CT head without contrast 05/18/2016. FINDINGS: Brain: A punctate focus of restricted diffusion is present within the right caudate head. There is no associated hemorrhage. No other acute infarct is present. A remote nonhemorrhagic lacunar infarct is  present in the right cerebellum. There is mild generalized atrophy and periventricular white matter disease bilaterally. Abnormal CSF signal is present over the convexity on the FLAIR images bilaterally. Vascular: Flow is present in the major intracranial arteries. Skull and upper cervical spine: The skullbase is within normal limits. Midline sagittal structures are unremarkable. The craniocervical junction is within normal limits. Sinuses/Orbits: The paranasal sinuses and mastoid air cells are clear. The globes and orbits are intact. IMPRESSION: 1. Punctate acute nonhemorrhagic infarct within the right caudate head. 2. Abnormal CSF signal over the convexities bilaterally is worrisome for meningitis in this clinical setting. MRI the brain with contrast or lumbar puncture is recommended for further evaluation 3. Mild age advanced atrophy and white matter disease likely reflects the sequela of chronic microvascular ischemia. 4. Remote lacunar infarcts in the right cerebellum. These results were called by telephone at the time of interpretation on 05/19/2016 at 8:40 pm to Dr. Lorin Mercy , who verbally acknowledged these results. Electronically Signed   By: San Morelle M.D.   On: 05/19/2016 20:41   Mr Brain W Contrast  Result Date: 05/19/2016 CLINICAL DATA:  Speech difficulties, follow-up abnormal MRI, suspect meningitis. EXAM: MRI HEAD WITH CONTRAST TECHNIQUE: Multiplanar, multiecho pulse sequences of the brain and surrounding structures were obtained with intravenous contrast. CONTRAST:  98mL MULTIHANCE GADOBENATE DIMEGLUMINE 529 MG/ML IV SOLN COMPARISON:  MRI head without  contrast May 19, 2016 at 1911 hours FINDINGS: No abnormal intraparenchymal or extra-axial enhancement, fluid collections or masses with particular attention to the convexities. IMPRESSION: Negative postcontrast MRI head. Electronically Signed   By: Elon Alas M.D.   On: 05/19/2016 22:26   US Carotid Bilateral (at [facility]  Only)  Result Date: 05/20/2016 CLINICAL DATA:  Altered mental status, vertigo, TIA symptoms, hyperlipidemia tobacco use. EXAM: BILATERAL CAROTID DUPLEX ULTRASOUND TECHNIQUE: Pearline Cables scale imaging, color Doppler and duplex ultrasound were performed of bilateral carotid and vertebral arteries in the neck. COMPARISON:  None. FINDINGS: Criteria: Quantification of carotid stenosis is based on velocity parameters that correlate the residual internal carotid diameter with NASCET-based stenosis levels, using the diameter of the distal internal carotid lumen as the denominator for stenosis measurement. The following velocity measurements were obtained: RIGHT ICA:  128/35 cm/sec CCA:  [id number] cm/sec SYSTOLIC ICA/CCA RATIO:  [id number] DIASTOLIC ICA/CCA RATIO:  0000000 ECA:  171 cm/sec LEFT ICA:  99/34 cm/sec CCA:  [id number] cm/sec SYSTOLIC ICA/CCA RATIO:  [id number] DIASTOLIC ICA/CCA RATIO:  [id number] ECA:  108 cm/sec RIGHT CAROTID ARTERY: Mild heterogeneous partially calcified right carotid bifurcation atherosclerosis. This extends into the ICA proximally. Despite this, there is no hemodynamically significant right ICA stenosis, velocity elevation, or turbulent flow. Degree of narrowing less than 50%. RIGHT VERTEBRAL ARTERY:  Antegrade LEFT CAROTID ARTERY: Similar mild heterogeneous and partially calcified atherosclerosis at the left carotid bifurcation. Despite this, no hemodynamically significant left ICA stenosis, velocity elevation, or turbulent flow. Degree of narrowing also less than 50%. LEFT VERTEBRAL ARTERY:  Antegrade IMPRESSION: Mild bilateral carotid atherosclerosis. No hemodynamically significant ICA stenosis. Degree of narrowing less than 50% bilaterally. Patent antegrade vertebral flow bilaterally Electronically Signed   By: Jerilynn Mages.  Shick M.D.   On: 05/20/2016 12:56   Dg Fluoro Guide Lumbar Puncture  Result Date: 05/20/2016 CLINICAL DATA:  Fever of unknown origin. History of hypertension and seizures. EXAM: DIAGNOSTIC LUMBAR  PUNCTURE UNDER FLUOROSCOPIC GUIDANCE FLUOROSCOPY TIME:  Fluoroscopy Time:  0 minutes and 6 seconds Radiation Exposure Index (if provided by the fluoroscopic device): 1.20 mGy Number of Acquired Spot Images: 1 PROCEDURE: Informed consent was obtained from the patient prior to the procedure, including potential complications of headache, allergy, and pain. With the patient prone, the lower back was prepped with Betadine. 1% Lidocaine was used for local anesthesia. Lumbar puncture was performed at the L3 level using a 20 gauge needle with return of clear CSF with an opening pressure of 15 cm water. 9.5 ml of CSF were obtained for laboratory studies. The patient tolerated the procedure well and there were no apparent complications. IMPRESSION: Successful fluoroscopic guided lumbar puncture with acquisition of 9.5 mL of clear CSF. Electronically Signed   By: Lajean Manes M.D.   On: 05/20/2016 12:49    Microbiology: Recent Results (from the past 240 hour(s))  Urine culture     Status: Abnormal   Collection Time: 05/16/16  1:44 PM  Result Value Ref Range Status   Specimen Description URINE, CLEAN CATCH  Final   Special Requests NONE  Final   Culture MULTIPLE SPECIES PRESENT, SUGGEST RECOLLECTION (A)  Final   Report Status 05/17/2016 FINAL  Final  Urine culture     Status: Abnormal   Collection Time: 05/18/16 10:08 AM  Result Value Ref Range Status   Specimen Description URINE, RANDOM  Final   Special Requests NONE  Final   Culture 30,000 COLONIES/mL MORGANELLA MORGANII (A)  Final   Report Status 05/21/2016 FINAL  Final   Organism ID, Bacteria MORGANELLA MORGANII (A)  Final      Susceptibility   Morganella morganii - MIC*    AMPICILLIN >=32 RESISTANT Resistant     CEFAZOLIN >=64 RESISTANT Resistant     CEFTRIAXONE <=1 SENSITIVE Sensitive     CIPROFLOXACIN <=0.25 SENSITIVE Sensitive     GENTAMICIN <=1 SENSITIVE Sensitive     IMIPENEM 1 SENSITIVE Sensitive     NITROFURANTOIN 64 RESISTANT Resistant      TRIMETH/SULFA <=20 SENSITIVE Sensitive     AMPICILLIN/SULBACTAM >=32 RESISTANT Resistant     PIP/TAZO <=4 SENSITIVE Sensitive     * 30,000 COLONIES/mL MORGANELLA MORGANII  Blood culture (routine x 2)     Status: Abnormal   Collection Time: 05/18/16 11:11 AM  Result Value Ref Range Status   Specimen Description BLOOD LEFT ANTECUBITAL DRAWN BY RN  Final   Special Requests BOTTLES DRAWN AEROBIC AND ANAEROBIC 5CC EACH  Final   Culture  Setup Time   Final    GRAM POSITIVE COCCI IN PAIRS RECOVERED FROM THE AEROBIC BOTTLE Gram Stain Report Called to,Read Back By and Verified With: EVERETT,R. AT 1211 ON 05/19/2016 BY BAUGHAM,M. Performed at [place], READ BACK BY AND VERIFIED WITH: C. THOMAS, RN ([facility]) AT 2005 ON 05/19/16 BY C. JESSUP, MLT    Culture (A)  Final    STAPHYLOCOCCUS SPECIES (COAGULASE NEGATIVE) THE SIGNIFICANCE OF ISOLATING THIS ORGANISM FROM A SINGLE SET OF BLOOD CULTURES WHEN MULTIPLE SETS ARE DRAWN IS UNCERTAIN. PLEASE NOTIFY THE MICROBIOLOGY DEPARTMENT WITHIN ONE WEEK IF SPECIATION AND SENSITIVITIES ARE REQUIRED. Performed at [facility]    Report Status 05/21/2016 FINAL  Final  Blood Culture ID Panel (Reflexed)     Status: Abnormal   Collection Time: 05/18/16 11:11 AM  Result Value Ref Range Status   Enterococcus species NOT DETECTED NOT DETECTED Final   Listeria monocytogenes NOT DETECTED NOT DETECTED Final   Staphylococcus species DETECTED (A) NOT DETECTED Final    Comment: CRITICAL RESULT CALLED TO, READ BACK BY AND VERIFIED WITH: C. THOMAS, RN ([facility]) AT 2005 ON 05/19/16 BY C. JESSUP, MLT.    Staphylococcus aureus NOT DETECTED NOT DETECTED Final   Methicillin resistance DETECTED (A) NOT DETECTED Final    Comment: CRITICAL RESULT CALLED TO, READ BACK BY AND VERIFIED WITH: C. THOMAS, RN ([facility]) AT 2005 ON 05/19/16 BY C. JESSUP, MLT.    Streptococcus species NOT DETECTED NOT DETECTED Final   Streptococcus agalactiae NOT  DETECTED NOT DETECTED Final   Streptococcus pneumoniae NOT DETECTED NOT DETECTED Final   Streptococcus pyogenes NOT DETECTED NOT DETECTED Final   Acinetobacter baumannii NOT DETECTED NOT DETECTED Final   Enterobacteriaceae species NOT DETECTED NOT DETECTED Final   Enterobacter cloacae complex NOT DETECTED NOT DETECTED Final   Escherichia coli NOT DETECTED NOT DETECTED Final   Klebsiella oxytoca NOT DETECTED NOT DETECTED Final   Klebsiella pneumoniae NOT DETECTED NOT DETECTED Final   Proteus species NOT DETECTED NOT DETECTED Final   Serratia marcescens NOT DETECTED NOT DETECTED Final   Haemophilus influenzae NOT DETECTED NOT DETECTED Final   Neisseria meningitidis NOT DETECTED NOT DETECTED Final   Pseudomonas aeruginosa NOT DETECTED NOT DETECTED Final   Candida albicans NOT DETECTED NOT DETECTED Final   Candida glabrata NOT DETECTED NOT DETECTED Final   Candida krusei NOT DETECTED NOT DETECTED Final   Candida parapsilosis NOT DETECTED NOT DETECTED Final   Candida tropicalis NOT DETECTED NOT DETECTED Final    Comment:  Performed at [facility]  Blood culture (routine x 2)     Status: None   Collection Time: 05/18/16 11:20 AM  Result Value Ref Range Status   Specimen Description BLOOD RIGHT ANTECUBITAL  Final   Special Requests BOTTLES DRAWN AEROBIC AND ANAEROBIC 10CC EACH  Final   Culture NO GROWTH 5 DAYS  Final   Report Status 05/23/2016 FINAL  Final  Culture, blood (Routine X 2) w Reflex to ID Panel     Status: None (Preliminary result)   Collection Time: 05/20/16  9:00 AM  Result Value Ref Range Status   Specimen Description BLOOD RIGHT ANTECUBITAL  Final   Special Requests BOTTLES DRAWN AEROBIC AND ANAEROBIC 10CC  Final   Culture NO GROWTH 3 DAYS  Final   Report Status PENDING  Incomplete  Culture, blood (Routine X 2) w Reflex to ID Panel     Status: None (Preliminary result)   Collection Time: 05/20/16  9:02 AM  Result Value Ref Range Status   Specimen Description BLOOD  RIGHT HAND  Final   Special Requests BOTTLES DRAWN AEROBIC ONLY 4CC  Final   Culture NO GROWTH 3 DAYS  Final   Report Status PENDING  Incomplete  CSF culture     Status: None (Preliminary result)   Collection Time: 05/20/16 11:50 AM  Result Value Ref Range Status   Specimen Description CSF  Final   Special Requests NONE  Final   Gram Stain   Final    CYTOSPIN SMEAR WBC PRESENT, PREDOMINANTLY MONONUCLEAR NO ORGANISMS SEEN GSRC BULLINS,M. AT [id number] ON 05/20/2016 BY BAUGHAM,M. Performed at [facility]    Culture   Final    NO GROWTH 3 DAYS Performed at [facility]    Report Status PENDING  Incomplete     Labs: Basic Metabolic Panel:  Recent Labs Lab 05/18/16 1111 05/19/16 0632 05/21/16 0633  NA 128* 128* 132*  K 3.9 3.6 3.5  CL 94* 96* 99*  CO2 26 24 27   GLUCOSE 106* 121* 121*  BUN 19 20 13   CREATININE 1.05 0.91 0.87  CALCIUM 8.5* 8.2* 8.2*   Liver Function Tests:  Recent Labs Lab 05/18/16 1111 05/19/16 0632 05/21/16 0633  AST 49* 50* 37  ALT 35 40 40  ALKPHOS 68 60 69  BILITOT 0.9 1.0 0.5  PROT 7.9 6.9 6.5  ALBUMIN 4.1 3.6 3.2*   CBC:  Recent Labs Lab 05/18/16 1111 05/19/16 0632 05/21/16 0633  WBC 13.6* 9.9 9.1  NEUTROABS 10.1*  --   --   HGB 13.2 11.5* 11.0*  HCT 37.0* 32.1* 31.2*  MCV 89.4 88.7 88.9  PLT 265 216 212   :  Principal Problem:   Aseptic meningitis Active Problems:   HTN (hypertension)   GERD (gastroesophageal reflux disease)   Hyponatremia   Fever   Headache   Leukocytosis   Bacteremia   CVA (cerebral vascular accident) ([place])   Word finding difficulty   Time coordinating discharge: 35 minutes  Signed:  Murray Hodgkins, MD [facility] 05/23/2016, 12:29 PM

## 2016-05-23 NOTE — Progress Notes (Signed)
Patient discharged home with IV removed and site intact. Patient discharged with all personal belongings.  

## 2016-05-24 LAB — CSF CULTURE

## 2016-05-24 LAB — CSF CULTURE W GRAM STAIN: Culture: NO GROWTH

## 2016-05-24 LAB — PATHOLOGIST SMEAR REVIEW

## 2016-05-25 LAB — CULTURE, BLOOD (ROUTINE X 2)
CULTURE: NO GROWTH
CULTURE: NO GROWTH

## 2017-09-30 ENCOUNTER — Encounter (HOSPITAL_COMMUNITY): Payer: Self-pay | Admitting: *Deleted

## 2017-09-30 ENCOUNTER — Emergency Department (HOSPITAL_COMMUNITY): Payer: Medicare Other

## 2017-09-30 ENCOUNTER — Inpatient Hospital Stay (HOSPITAL_COMMUNITY)
Admission: EM | Admit: 2017-09-30 | Discharge: 2017-10-02 | DRG: 872 | Disposition: A | Discharge status: Home / Self Care | Payer: Medicare Other | Attending: Internal Medicine | Admitting: Internal Medicine

## 2017-09-30 ENCOUNTER — Other Ambulatory Visit: Payer: Self-pay

## 2017-09-30 ENCOUNTER — Inpatient Hospital Stay (HOSPITAL_COMMUNITY): Payer: Medicare Other

## 2017-09-30 DIAGNOSIS — E878 Other disorders of electrolyte and fluid balance, not elsewhere classified: Secondary | ICD-10-CM | POA: Diagnosis present

## 2017-09-30 DIAGNOSIS — R3989 Other symptoms and signs involving the genitourinary system: Secondary | ICD-10-CM | POA: Diagnosis not present

## 2017-09-30 DIAGNOSIS — G40909 Epilepsy, unspecified, not intractable, without status epilepticus: Secondary | ICD-10-CM | POA: Diagnosis present

## 2017-09-30 DIAGNOSIS — Z7901 Long term (current) use of anticoagulants: Secondary | ICD-10-CM | POA: Diagnosis not present

## 2017-09-30 DIAGNOSIS — E860 Dehydration: Secondary | ICD-10-CM | POA: Diagnosis present

## 2017-09-30 DIAGNOSIS — I10 Essential (primary) hypertension: Secondary | ICD-10-CM | POA: Diagnosis not present

## 2017-09-30 DIAGNOSIS — N400 Enlarged prostate without lower urinary tract symptoms: Secondary | ICD-10-CM | POA: Diagnosis present

## 2017-09-30 DIAGNOSIS — F419 Anxiety disorder, unspecified: Secondary | ICD-10-CM | POA: Diagnosis present

## 2017-09-30 DIAGNOSIS — E871 Hypo-osmolality and hyponatremia: Secondary | ICD-10-CM | POA: Diagnosis not present

## 2017-09-30 DIAGNOSIS — G9340 Encephalopathy, unspecified: Secondary | ICD-10-CM | POA: Diagnosis present

## 2017-09-30 DIAGNOSIS — Z8249 Family history of ischemic heart disease and other diseases of the circulatory system: Secondary | ICD-10-CM

## 2017-09-30 DIAGNOSIS — D72829 Elevated white blood cell count, unspecified: Secondary | ICD-10-CM

## 2017-09-30 DIAGNOSIS — R32 Unspecified urinary incontinence: Secondary | ICD-10-CM | POA: Diagnosis present

## 2017-09-30 DIAGNOSIS — J310 Chronic rhinitis: Secondary | ICD-10-CM | POA: Diagnosis present

## 2017-09-30 DIAGNOSIS — N390 Urinary tract infection, site not specified: Secondary | ICD-10-CM | POA: Diagnosis present

## 2017-09-30 DIAGNOSIS — N419 Inflammatory disease of prostate, unspecified: Secondary | ICD-10-CM | POA: Diagnosis present

## 2017-09-30 DIAGNOSIS — G9349 Other encephalopathy: Secondary | ICD-10-CM | POA: Diagnosis present

## 2017-09-30 DIAGNOSIS — Z86718 Personal history of other venous thrombosis and embolism: Secondary | ICD-10-CM | POA: Diagnosis not present

## 2017-09-30 DIAGNOSIS — F4310 Post-traumatic stress disorder, unspecified: Secondary | ICD-10-CM | POA: Diagnosis present

## 2017-09-30 DIAGNOSIS — Z79899 Other long term (current) drug therapy: Secondary | ICD-10-CM

## 2017-09-30 DIAGNOSIS — F17290 Nicotine dependence, other tobacco product, uncomplicated: Secondary | ICD-10-CM | POA: Diagnosis present

## 2017-09-30 DIAGNOSIS — E785 Hyperlipidemia, unspecified: Secondary | ICD-10-CM | POA: Diagnosis present

## 2017-09-30 DIAGNOSIS — R4182 Altered mental status, unspecified: Secondary | ICD-10-CM

## 2017-09-30 DIAGNOSIS — K219 Gastro-esophageal reflux disease without esophagitis: Secondary | ICD-10-CM | POA: Diagnosis present

## 2017-09-30 DIAGNOSIS — F329 Major depressive disorder, single episode, unspecified: Secondary | ICD-10-CM | POA: Diagnosis present

## 2017-09-30 DIAGNOSIS — R3911 Hesitancy of micturition: Secondary | ICD-10-CM | POA: Diagnosis not present

## 2017-09-30 DIAGNOSIS — A419 Sepsis, unspecified organism: Secondary | ICD-10-CM | POA: Diagnosis present

## 2017-09-30 DIAGNOSIS — R509 Fever, unspecified: Secondary | ICD-10-CM

## 2017-09-30 DIAGNOSIS — N401 Enlarged prostate with lower urinary tract symptoms: Secondary | ICD-10-CM

## 2017-09-30 LAB — CBC WITH DIFFERENTIAL/PLATELET
Basophils Absolute: 0 10*3/uL (ref 0.0–0.1)
Basophils Relative: 0 %
Eosinophils Absolute: 0.3 10*3/uL (ref 0.0–0.7)
Eosinophils Relative: 2 %
HCT: 37.5 % — ABNORMAL LOW (ref 39.0–52.0)
Hemoglobin: 12.5 g/dL — ABNORMAL LOW (ref 13.0–17.0)
Lymphocytes Relative: 5 %
Lymphs Abs: 0.8 10*3/uL (ref 0.7–4.0)
MCH: 30.6 pg (ref 26.0–34.0)
MCHC: 33.3 g/dL (ref 30.0–36.0)
MCV: 91.9 fL (ref 78.0–100.0)
Monocytes Absolute: 1 10*3/uL (ref 0.1–1.0)
Monocytes Relative: 6 %
Neutro Abs: 13.9 10*3/uL — ABNORMAL HIGH (ref 1.7–7.7)
Neutrophils Relative %: 87 %
Platelets: 237 10*3/uL (ref 150–400)
RBC: 4.08 MIL/uL — ABNORMAL LOW (ref 4.22–5.81)
RDW: 12.7 % (ref 11.5–15.5)
WBC: 16 10*3/uL — ABNORMAL HIGH (ref 4.0–10.5)

## 2017-09-30 LAB — PROTIME-INR
INR: 2.36
Prothrombin Time: 25.6 seconds — ABNORMAL HIGH (ref 11.4–15.2)

## 2017-09-30 LAB — PHOSPHORUS: PHOSPHORUS: 3.7 mg/dL (ref 2.5–4.6)

## 2017-09-30 LAB — VITAMIN B12: Vitamin B-12: 349 pg/mL (ref 180–914)

## 2017-09-30 LAB — LACTIC ACID, PLASMA
Lactic Acid, Venous: 1.1 mmol/L (ref 0.5–1.9)
Lactic Acid, Venous: 0.9 mmol/L (ref 0.5–1.9)

## 2017-09-30 LAB — HEPATIC FUNCTION PANEL
ALBUMIN: 3.3 g/dL — AB (ref 3.5–5.0)
ALT: 14 U/L — ABNORMAL LOW (ref 17–63)
AST: 16 U/L (ref 15–41)
Alkaline Phosphatase: 61 U/L (ref 38–126)
BILIRUBIN DIRECT: 0.2 mg/dL (ref 0.1–0.5)
BILIRUBIN TOTAL: 1.3 mg/dL — AB (ref 0.3–1.2)
Indirect Bilirubin: 1.1 mg/dL — ABNORMAL HIGH (ref 0.3–0.9)
Total Protein: 6.7 g/dL (ref 6.5–8.1)

## 2017-09-30 LAB — URINALYSIS, ROUTINE W REFLEX MICROSCOPIC
Bacteria, UA: NONE SEEN
Bilirubin Urine: NEGATIVE
Glucose, UA: NEGATIVE mg/dL
Ketones, ur: NEGATIVE mg/dL
Leukocytes, UA: NEGATIVE
Nitrite: NEGATIVE
Protein, ur: NEGATIVE mg/dL
Specific Gravity, Urine: 1.016 (ref 1.005–1.030)
pH: 5 (ref 5.0–8.0)

## 2017-09-30 LAB — TSH: TSH: 3.326 u[IU]/mL (ref 0.350–4.500)

## 2017-09-30 LAB — INFLUENZA PANEL BY PCR (TYPE A & B)
Influenza A By PCR: NEGATIVE
Influenza B By PCR: NEGATIVE

## 2017-09-30 LAB — BASIC METABOLIC PANEL
Anion gap: 12 (ref 5–15)
BUN: 20 mg/dL (ref 6–20)
CO2: 23 mmol/L (ref 22–32)
Calcium: 8.5 mg/dL — ABNORMAL LOW (ref 8.9–10.3)
Chloride: 96 mmol/L — ABNORMAL LOW (ref 101–111)
Creatinine, Ser: 1.16 mg/dL (ref 0.61–1.24)
GFR calc Af Amer: >60 mL/min (ref >60)
GFR calc non Af Amer: >60 mL/min (ref >60)
Glucose, Bld: 105 mg/dL — ABNORMAL HIGH (ref 65–99)
Potassium: 4.1 mmol/L (ref 3.5–5.1)
Sodium: 131 mmol/L — ABNORMAL LOW (ref 135–145)

## 2017-09-30 LAB — AMMONIA: AMMONIA: 19 umol/L (ref 9–35)

## 2017-09-30 LAB — MAGNESIUM: MAGNESIUM: 1.8 mg/dL (ref 1.7–2.4)

## 2017-09-30 LAB — PHENYTOIN LEVEL, TOTAL: Phenytoin Lvl: <2.5 ug/mL — ABNORMAL LOW (ref 10.0–20.0)

## 2017-09-30 MED ORDER — ZOLPIDEM TARTRATE 5 MG PO TABS: 5.0000 mg | ORAL_TABLET | Freq: Every evening | ORAL | Status: DC | PRN

## 2017-09-30 MED ORDER — ADULT MULTIVITAMIN W/MINERALS CH
1.0000 | ORAL_TABLET | Freq: Every day | ORAL | Status: DC
  Administered 2017-09-30 – 2017-10-01 (×2): 1 via ORAL
  Filled 2017-09-30 (×2): qty 1

## 2017-09-30 MED ORDER — SIMVASTATIN 20 MG PO TABS
20.0000 mg | ORAL_TABLET | Freq: Every day | ORAL | Status: DC
  Administered 2017-09-30 – 2017-10-01 (×2): 20 mg via ORAL
  Filled 2017-09-30 (×2): qty 1

## 2017-09-30 MED ORDER — PANTOPRAZOLE SODIUM 40 MG PO TBEC
40.0000 mg | DELAYED_RELEASE_TABLET | Freq: Every day | ORAL | Status: DC
  Administered 2017-10-01 – 2017-10-02 (×2): 40 mg via ORAL
  Filled 2017-09-30 (×2): qty 1

## 2017-09-30 MED ORDER — DIAZEPAM 5 MG PO TABS
5.0000 mg | ORAL_TABLET | Freq: Every day | ORAL | Status: DC
  Administered 2017-09-30 – 2017-10-02 (×3): 5 mg via ORAL
  Filled 2017-09-30 (×3): qty 1

## 2017-09-30 MED ORDER — ACETAMINOPHEN 325 MG PO TABS
650.0000 mg | ORAL_TABLET | Freq: Once | ORAL | Status: AC
  Administered 2017-09-30: 650 mg via ORAL
  Filled 2017-09-30: qty 2

## 2017-09-30 MED ORDER — PIPERACILLIN-TAZOBACTAM 3.375 G IVPB 30 MIN
3.3750 g | Freq: Once | INTRAVENOUS | Status: AC
  Administered 2017-09-30: 3.375 g via INTRAVENOUS
  Filled 2017-09-30: qty 50

## 2017-09-30 MED ORDER — TAMSULOSIN HCL 0.4 MG PO CAPS
0.8000 mg | ORAL_CAPSULE | Freq: Every day | ORAL | Status: DC
  Administered 2017-09-30 – 2017-10-01 (×2): 0.8 mg via ORAL
  Filled 2017-09-30 (×2): qty 2

## 2017-09-30 MED ORDER — ONDANSETRON HCL 4 MG PO TABS: 4.0000 mg | ORAL_TABLET | Freq: Four times a day (QID) | ORAL | Status: DC | PRN

## 2017-09-30 MED ORDER — ARIPIPRAZOLE 10 MG PO TABS
20.0000 mg | ORAL_TABLET | Freq: Every day | ORAL | Status: DC
  Administered 2017-09-30 – 2017-10-02 (×3): 20 mg via ORAL
  Filled 2017-09-30 (×7): qty 2

## 2017-09-30 MED ORDER — LIDOCAINE HCL (PF) 1 % IJ SOLN
2.0000 mL | Freq: Once | INTRAMUSCULAR | Status: AC
  Administered 2017-09-30: 2 mL

## 2017-09-30 MED ORDER — ACETAMINOPHEN 325 MG PO TABS
650.0000 mg | ORAL_TABLET | Freq: Four times a day (QID) | ORAL | Status: DC | PRN
  Administered 2017-10-01: 650 mg via ORAL
  Filled 2017-09-30: qty 2

## 2017-09-30 MED ORDER — VENLAFAXINE HCL ER 75 MG PO CP24
150.0000 mg | ORAL_CAPSULE | Freq: Every day | ORAL | Status: DC
  Administered 2017-09-30 – 2017-10-01 (×2): 150 mg via ORAL
  Filled 2017-09-30 (×2): qty 2

## 2017-09-30 MED ORDER — LIDOCAINE HCL (PF) 1 % IJ SOLN
INTRAMUSCULAR | Status: AC
  Administered 2017-09-30: 2 mL
  Filled 2017-09-30: qty 2

## 2017-09-30 MED ORDER — ONDANSETRON HCL 4 MG/2ML IJ SOLN: 4.0000 mg | Freq: Four times a day (QID) | INTRAMUSCULAR | Status: DC | PRN

## 2017-09-30 MED ORDER — SODIUM CHLORIDE 0.9 % IV SOLN
1.0000 g | Freq: Three times a day (TID) | INTRAVENOUS | Status: DC
  Administered 2017-09-30 – 2017-10-02 (×5): 1 g via INTRAVENOUS
  Filled 2017-09-30 (×11): qty 1

## 2017-09-30 MED ORDER — SODIUM CHLORIDE 0.9 % IV SOLN
INTRAVENOUS | Status: DC
  Administered 2017-09-30 – 2017-10-01 (×2): via INTRAVENOUS

## 2017-09-30 MED ORDER — VANCOMYCIN HCL IN DEXTROSE 1-5 GM/200ML-% IV SOLN
1000.0000 mg | Freq: Once | INTRAVENOUS | Status: AC
  Administered 2017-09-30: 1000 mg via INTRAVENOUS
  Filled 2017-09-30: qty 200

## 2017-09-30 MED ORDER — ACETAMINOPHEN 650 MG RE SUPP: 650.0000 mg | Freq: Four times a day (QID) | RECTAL | Status: DC | PRN

## 2017-09-30 MED ORDER — SODIUM CHLORIDE 0.9 % IV BOLUS (SEPSIS)
1000.0000 mL | Freq: Once | INTRAVENOUS | Status: AC
  Administered 2017-09-30: 1000 mL via INTRAVENOUS

## 2017-09-30 MED ORDER — CEFTRIAXONE SODIUM 2 G IJ SOLR: 2.0000 g | Freq: Once | INTRAMUSCULAR | Status: DC

## 2017-09-30 MED ORDER — WARFARIN - PHARMACIST DOSING INPATIENT: Freq: Every day | Status: DC

## 2017-09-30 MED ORDER — LORATADINE 10 MG PO TABS
10.0000 mg | ORAL_TABLET | Freq: Every day | ORAL | Status: DC
  Administered 2017-09-30 – 2017-10-02 (×3): 10 mg via ORAL
  Filled 2017-09-30 (×3): qty 1

## 2017-09-30 MED ORDER — ORAL CARE MOUTH RINSE
15.0000 mL | Freq: Two times a day (BID) | OROMUCOSAL | Status: DC
  Administered 2017-09-30 – 2017-10-02 (×4): 15 mL via OROMUCOSAL

## 2017-09-30 MED ORDER — METOPROLOL SUCCINATE ER 25 MG PO TB24
25.0000 mg | ORAL_TABLET | Freq: Two times a day (BID) | ORAL | Status: DC
  Administered 2017-09-30 – 2017-10-02 (×4): 25 mg via ORAL
  Filled 2017-09-30 (×4): qty 1

## 2017-09-30 MED ORDER — IPRATROPIUM-ALBUTEROL 0.5-2.5 (3) MG/3ML IN SOLN: 3.0000 mL | Freq: Four times a day (QID) | RESPIRATORY_TRACT | Status: DC | PRN

## 2017-09-30 MED ORDER — WARFARIN SODIUM 7.5 MG PO TABS
7.5000 mg | ORAL_TABLET | Freq: Once | ORAL | Status: DC
  Filled 2017-09-30: qty 1

## 2017-09-30 NOTE — Progress Notes (Signed)
ANTICOAGULATION CONSULT NOTE - Initial Consult  Pharmacy Consult for Coumadin Indication: DVT  No Active Allergies  Patient Measurements: Height: 5\' 11"  (180.3 cm) Weight: 218 lb 14.7 oz (99.3 kg) IBW/kg (Calculated) : 75.3  Vital Signs: Temp: 98.2 F (36.8 C) (03/08 1813) Temp Source: Oral (03/08 1813) BP: 142/73 (03/08 1813) Pulse Rate: 72 (03/08 1813)  Labs: Recent Labs    09/30/17 1136 09/30/17 1724  HGB 12.5*  --   HCT 37.5*  --   PLT 237  --   LABPROT  --  25.6*  INR  --  2.36  CREATININE 1.16  --     Estimated Creatinine Clearance: 68.1 mL/min (by C-G formula based on SCr of 1.16 mg/dL).   Medical History: Past Medical History:  Diagnosis Date  . GERD (gastroesophageal reflux disease)   . HTN (hypertension)   . Hyperlipidemia   . PTSD (post-traumatic stress disorder)   . Renal disorder   . Seizure ([place])     Medications:  Medications Prior to Admission  Medication Sig Dispense Refill Last Dose  . ARIPiprazole (ABILIFY) 20 MG tablet Take 20 mg by mouth daily.    09/29/2017 at Unknown time  . cetirizine (ZYRTEC) 10 MG tablet Take 10 mg by mouth daily.   09/29/2017 at Unknown time  . diazepam (VALIUM) 5 MG tablet Take 5 mg by mouth daily.    Past Week at Unknown time  . lisinopril (PRINIVIL,ZESTRIL) 40 MG tablet Take 40 mg by mouth every morning.   09/30/2017 at Unknown time  . metoprolol succinate (TOPROL-XL) 25 MG 24 hr tablet Take 25 mg by mouth 2 (two) times daily.    09/30/2017 at 0700  . Multiple Vitamin (MULTIVITAMIN WITH MINERALS) TABS tablet Take 1 tablet by mouth at bedtime.    09/29/2017 at Unknown time  . omeprazole (PRILOSEC) 20 MG capsule Take 20 mg by mouth daily.   09/30/2017 at Unknown time  . OVER THE COUNTER MEDICATION CBD oil: patient takes 10 drops by mouth and apply topically to affected areas daily for arthritis   09/29/2017 at Unknown time  . simvastatin (ZOCOR) 40 MG tablet Take 20 mg by mouth at bedtime.    09/29/2017 at Unknown time  .  tamsulosin (FLOMAX) 0.4 MG CAPS capsule Take 0.4 mg by mouth at bedtime.    09/29/2017 at Unknown time  . venlafaxine XR (EFFEXOR-XR) 150 MG 24 hr capsule Take 150 mg by mouth at bedtime.    09/29/2017 at Unknown time  . warfarin (COUMADIN) 5 MG tablet Take 7.5 mg by mouth daily.    09/30/2017 at 0700  . zolpidem (AMBIEN) 10 MG tablet Take 10 mg by mouth at bedtime as needed for sleep. Max 3-5 tablets per week.   Past Week at Unknown time    Assessment: 72 year old male with a past medical history significant for hypertension, hyperlipidemia, PTSD, depression/anxiety, prior history of seizure disorder (no longer using antiepileptics), and hx of DVT; who was brought to the emergency department secondary to confusion and erratic behavior. Pharmacy asked to manage coumadin therapy. INR is therapeutic Home regimen is 7.5mg  daily  Goal of Therapy:  INR 2-3 Monitor platelets by anticoagulation protocol: Yes   Plan:  Coumadin 7.5mg  po x 1 today PT-INR daily Monitor for S/S of bleeding  Isac Sarna, BS [place]ena [place], BCPS Clinical Pharmacist Pager [phone] 09/30/2017,7:12 PM

## 2017-09-30 NOTE — ED Notes (Signed)
Patient ambulated without oxygen.  Patient's o2 sat maintained at 96-97%.  Patient denies any sob whiile walking.  Patient also walked without any assistance.

## 2017-09-30 NOTE — ED Notes (Signed)
Patient transported to CT 
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
## 2017-10-01 DIAGNOSIS — R3989 Other symptoms and signs involving the genitourinary system: Secondary | ICD-10-CM

## 2017-10-01 LAB — BASIC METABOLIC PANEL
Anion gap: 10 (ref 5–15)
BUN: 17 mg/dL (ref 6–20)
CALCIUM: 8.7 mg/dL — AB (ref 8.9–10.3)
CO2: 22 mmol/L (ref 22–32)
Chloride: 106 mmol/L (ref 101–111)
Creatinine, Ser: 1.06 mg/dL (ref 0.61–1.24)
GFR calc non Af Amer: >60 mL/min (ref >60)
Glucose, Bld: 112 mg/dL — ABNORMAL HIGH (ref 65–99)
Potassium: 4.1 mmol/L (ref 3.5–5.1)
Sodium: 138 mmol/L (ref 135–145)

## 2017-10-01 LAB — CBC
HCT: 34.6 % — ABNORMAL LOW (ref 39.0–52.0)
Hemoglobin: 11.5 g/dL — ABNORMAL LOW (ref 13.0–17.0)
MCH: 30.5 pg (ref 26.0–34.0)
MCHC: 33.2 g/dL (ref 30.0–36.0)
MCV: 91.8 fL (ref 78.0–100.0)
PLATELETS: 214 10*3/uL (ref 150–400)
RBC: 3.77 MIL/uL — ABNORMAL LOW (ref 4.22–5.81)
RDW: 12.7 % (ref 11.5–15.5)
WBC: 10.4 10*3/uL (ref 4.0–10.5)

## 2017-10-01 LAB — PROTIME-INR
INR: 2.51
Prothrombin Time: 26.9 seconds — ABNORMAL HIGH (ref 11.4–15.2)

## 2017-10-01 LAB — HIV ANTIBODY (ROUTINE TESTING W REFLEX): HIV Screen 4th Generation wRfx: NONREACTIVE

## 2017-10-01 LAB — RPR: RPR: NONREACTIVE

## 2017-10-01 MED ORDER — LISINOPRIL 10 MG PO TABS
20.0000 mg | ORAL_TABLET | Freq: Every day | ORAL | Status: DC
  Administered 2017-10-01 – 2017-10-02 (×2): 20 mg via ORAL
  Filled 2017-10-01 (×2): qty 2

## 2017-10-01 MED ORDER — WARFARIN SODIUM 7.5 MG PO TABS
7.5000 mg | ORAL_TABLET | Freq: Once | ORAL | Status: AC
  Administered 2017-10-01: 7.5 mg via ORAL
  Filled 2017-10-01: qty 1

## 2017-10-01 MED ORDER — FLUTICASONE PROPIONATE 50 MCG/ACT NA SUSP
1.0000 | Freq: Every day | NASAL | Status: DC
Start: 2017-10-01 — End: 2017-10-02
  Administered 2017-10-01 – 2017-10-02 (×2): 1 via NASAL
  Filled 2017-10-01: qty 16

## 2017-10-01 MED ORDER — OXCARBAZEPINE 300 MG PO TABS
300.0000 mg | ORAL_TABLET | Freq: Two times a day (BID) | ORAL | Status: DC
  Administered 2017-10-02 (×2): 300 mg via ORAL
  Filled 2017-10-01 (×5): qty 1

## 2017-10-01 NOTE — Progress Notes (Signed)
ANTICOAGULATION CONSULT NOTE - Initial Consult  Pharmacy Consult for Coumadin Indication: DVT  No Known Allergies  Patient Measurements: Height: 5\' 11"  (180.3 cm) Weight: 218 lb 4.1 oz (99 kg) IBW/kg (Calculated) : 75.3  Vital Signs: Temp: 98.2 F (36.8 C) (03/09 0500) Temp Source: Oral (03/09 0500) BP: 145/70 (03/09 0500) Pulse Rate: 75 (03/09 0500)  Labs: Recent Labs    09/30/17 1136 09/30/17 1724 10/01/17 0429  HGB 12.5*  --  11.5*  HCT 37.5*  --  34.6*  PLT 237  --  214  LABPROT  --  25.6* 26.9*  INR  --  2.36 2.51  CREATININE 1.16  --  1.06    Estimated Creatinine Clearance: 74.4 mL/min (by C-G formula based on SCr of 1.06 mg/dL).   Medical History: Past Medical History:  Diagnosis Date  . GERD (gastroesophageal reflux disease)   . HTN (hypertension)   . Hyperlipidemia   . PTSD (post-traumatic stress disorder)   . Renal disorder   . Seizure ([place])     Medications:  Medications Prior to Admission  Medication Sig Dispense Refill Last Dose  . ARIPiprazole (ABILIFY) 20 MG tablet Take 20 mg by mouth daily.    09/29/2017 at Unknown time  . cetirizine (ZYRTEC) 10 MG tablet Take 10 mg by mouth daily.   09/29/2017 at Unknown time  . diazepam (VALIUM) 5 MG tablet Take 5 mg by mouth daily.    Past Week at Unknown time  . lisinopril (PRINIVIL,ZESTRIL) 40 MG tablet Take 40 mg by mouth every morning.   09/30/2017 at Unknown time  . metoprolol succinate (TOPROL-XL) 25 MG 24 hr tablet Take 25 mg by mouth 2 (two) times daily.    09/30/2017 at 0700  . Multiple Vitamin (MULTIVITAMIN WITH MINERALS) TABS tablet Take 1 tablet by mouth at bedtime.    09/29/2017 at Unknown time  . omeprazole (PRILOSEC) 20 MG capsule Take 20 mg by mouth daily.   09/30/2017 at Unknown time  . OVER THE COUNTER MEDICATION CBD oil: patient takes 10 drops by mouth and apply topically to affected areas daily for arthritis   09/29/2017 at Unknown time  . simvastatin (ZOCOR) 40 MG tablet Take 20 mg by mouth at  bedtime.    09/29/2017 at Unknown time  . tamsulosin (FLOMAX) 0.4 MG CAPS capsule Take 0.4 mg by mouth at bedtime.    09/29/2017 at Unknown time  . venlafaxine XR (EFFEXOR-XR) 150 MG 24 hr capsule Take 150 mg by mouth at bedtime.    09/29/2017 at Unknown time  . warfarin (COUMADIN) 5 MG tablet Take 7.5 mg by mouth daily.    09/30/2017 at 0700  . zolpidem (AMBIEN) 10 MG tablet Take 10 mg by mouth at bedtime as needed for sleep. Max 3-5 tablets per week.   Past Week at Unknown time    Assessment: 72 year old male with a past medical history significant for hypertension, hyperlipidemia, PTSD, depression/anxiety, prior history of seizure disorder (no longer using antiepileptics), and hx of DVT; who was brought to the emergency department secondary to confusion and erratic behavior. Pharmacy asked to manage coumadin therapy. INR is therapeutic Home regimen is 7.5mg  daily  Goal of Therapy:  INR 2-3 Monitor platelets by anticoagulation protocol: Yes   Plan:  Coumadin 7.5mg  po x 1 today PT-INR daily Monitor for S/S of bleeding  Isac Sarna, BS Vena Austria, BCPS Clinical Pharmacist Pager [phone] 10/01/2017,9:34 AM
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
## 2017-10-02 LAB — PROTIME-INR
INR: 2.19
Prothrombin Time: 24.2 seconds — ABNORMAL HIGH (ref 11.4–15.2)

## 2017-10-02 MED ORDER — OXCARBAZEPINE 300 MG PO TABS: 300.0000 mg | ORAL_TABLET | Freq: Two times a day (BID) | ORAL | Status: AC

## 2017-10-02 MED ORDER — LISINOPRIL 40 MG PO TABS: 20.0000 mg | ORAL_TABLET | Freq: Every morning | ORAL | Status: AC

## 2017-10-02 MED ORDER — SULFAMETHOXAZOLE-TRIMETHOPRIM 800-160 MG PO TABS: 1.0000 | ORAL_TABLET | Freq: Two times a day (BID) | ORAL | 0 refills | Status: DC

## 2017-10-02 MED ORDER — TAMSULOSIN HCL 0.4 MG PO CAPS: 0.8000 mg | ORAL_CAPSULE | Freq: Every day | ORAL | 1 refills | Status: DC

## 2017-10-02 MED ORDER — FLUTICASONE PROPIONATE 50 MCG/ACT NA SUSP: 1.0000 | Freq: Every day | NASAL | 1 refills | Status: DC

## 2017-10-02 MED ORDER — WARFARIN SODIUM 7.5 MG PO TABS: 7.5000 mg | ORAL_TABLET | Freq: Once | ORAL | Status: DC

## 2017-10-02 NOTE — Discharge Summary (Signed)
Physician Discharge Summary  Nathan [name] FOY:[id number] DOB: 1945/11/13 DOA: 09/30/2017  PCP: Benito Mccreedy, MD  Admit date: 09/30/2017 Discharge date: 10/02/2017  Time spent: 35 minutes  Recommendations for Outpatient Follow-up:  Repeat basic metabolic panel to follow electrolytes and renal function Patient will benefit of outpatient follow-up with urologist Close follow-up the patient's Coumadin level as he has been discharged on Bactrim at this moment and can affect his INR. Repeat CBC to follow WBCs and hemoglobin trend. Reassess blood pressure and further adjust antihypertensive medications (his lisinopril dose was cut in half at the moment of discharge as he was going home on Bactrim and his blood pressure was also soft).  Discharge Diagnoses:  Principal Problem:   Acute encephalopathy Active Problems:   Sepsis ([place])   HTN (hypertension)   Hyponatremia   Leukocytosis   BPH (benign prostatic hyperplasia)   Discharge Condition: Stable and improved.  Patient discharged home with instruction to follow-up with PCP in 10 days.  Diet recommendation: Heart healthy diet  Filed Weights   09/30/17 1130 09/30/17 1813 10/01/17 0500  Weight: 97.5 kg (215 lb) 99.3 kg (218 lb 14.7 oz) 99 kg (218 lb 4.1 oz)    History of present illness:  72 year old male with a past medical history significant for hypertension, hyperlipidemia, PTSD, depression/anxiety, prior history of seizure disorder (no longer using antiepileptics),and hx ofDVT;who was brought to the emergency department secondary to confusion and erratic behavior while driving. Patient reports experiencing sudden chills/subjective fever, coughing, rhinorrhea and just not feeling good. These symptoms has been presentfor the last 2-3 days prior to admission. He also reported having some increased frequency and incontinence; with a partial treatment of a UTI a week before presentation (received abx'sfrom his PCP). No chest pain,  no nausea, no vomiting, no diarrhea, no hematuria, no melena, no hematochezia,no focal neurologic deficits, no neck pain, no headache, no photophobia or blurred vision. Of note, patient reports being compliant with medication and reports just mild anorexia.   Hospital Course:  1-acute encephalopathy -Mentation is back to normal -Patient with normal B12, TSH, ammonia and nonreactive RPR and HIV. -Continue treatment with antibiotics as mentioned below. -advise to keep himself well hydrated  -CT scan demonstrated no acute intracranial abnormalities and the patient is no demonstrating any acute neurologic deficit or complaints to suggest meningitis or any other cause for his encephalopathy at presentation.  2-Sepsis ([place]) -Patient met sepsis criteria on admission with altered mental status, elevated temperature, elevated WBCs and presumed source of infection his urine. -Sepsis features has resolved by now -no growth on his cx's by time of discharge -patient afebrile for 36 hours at discharge -antibiotics transitioned to PO and patient discharge home with instructions to follow up with PCP and will benefit of follow up with urology service.  3-UTI/prostatitis -treated with meropenem for 3 days -discharge on bactrim for 7 more days -cultures w/o specific growth   4-BPH -Continue Flomax; dose adjusted for better symptoms management -needs outpatient follow up with PCP  5-HTN (hypertension) -Blood pressure is stable and well-controlled currently -Continue Toprol and resume lisinopril (at adjusted dose) -Advised to follow heart healthy diet.  6-Hyponatremia -TSH within normal limits -After fluid resuscitation patient's sodium is back to normal -Follow electrolytes trend (especially while using Trileptal).    7-Leukocytosis -Appears to be secondary to sepsis -After fluid resuscitation and antibiotics WBCs back to normal range. -Continue to follow trend with CBC at follow up.    8-depression/anxiety -No suicidal ideation or hallucination -Patient with  prior history of PTSD -Continue current anti-psychiatry regimen (Abilify, Valium and effexor)  9-rhinitis -Reports to be much better and is no complaining of PND -Still mildly congested -Will continue treatment with loratadine and Flonase -Chest x-ray on admission failed to demonstrate any acute cardiopulmonary process and auscultations demonstrated no wheezing, crackles or any alarming rhonchi.  10-history of DVT: -Continue warfarin  11-history of seizure -Patient reported today that he is no longer using phenytoin but he is on Trileptal 300 mg twice a day, as per his neurologist rec's as an outpatient. -Continue Trileptal -no seizure activity appreciated   Procedures:  See below for x-ray reports.  Consultations:  None  Discharge Exam: Vitals:   10/01/17 2107 10/02/17 0651  BP: 134/68 132/63  Pulse: 72 87  Resp: 18 18  Temp: 98.4 F (36.9 C) 98.6 F (37 C)  SpO2: 95% 92%    General: Afebrile, no chest pain, no complaints of shortness of breath and no requiring any oxygen supplementation. Alert, awake and oriented x3, in no acute distress. With improvement in his incontinence and urgency feeling.  Cardiovascular: S1 and S2, no rubs, no gallops,  Respiratory: Good air movement bilaterally, normal respiratory effort, no using accessory muscles, scattered rhonchi, no crackles.  Abdomen: Soft, nontender, nondistended, positive bowel sounds.  Musculoskeletal: No edema, no cyanosis, no clubbing.   Discharge Instructions   Discharge Instructions    Diet - low sodium heart healthy   Complete by:  As directed    Discharge instructions   Complete by:  As directed    Take medications as prescribed  Keep yourself well hydrated  Arrange follow up with PCP in 1 week Please contact your coumadin clinic, as antibiotics for UTI can affect your coumadin level     Allergies as of 10/02/2017    No Known Allergies     Medication List    STOP taking these medications   OVER THE COUNTER MEDICATION     TAKE these medications   ARIPiprazole 20 MG tablet Commonly known as:  ABILIFY Take 20 mg by mouth daily.   cetirizine 10 MG tablet Commonly known as:  ZYRTEC Take 10 mg by mouth daily.   diazepam 5 MG tablet Commonly known as:  VALIUM Take 5 mg by mouth daily.   fluticasone 50 MCG/ACT nasal spray Commonly known as:  FLONASE Place 1 spray into both nostrils daily.   lisinopril 40 MG tablet Commonly known as:  PRINIVIL,ZESTRIL Take 0.5 tablets (20 mg total) by mouth every morning. What changed:  how much to take   metoprolol succinate 25 MG 24 hr tablet Commonly known as:  TOPROL-XL Take 25 mg by mouth 2 (two) times daily.   multivitamin with minerals Tabs tablet Take 1 tablet by mouth at bedtime.   omeprazole 20 MG capsule Commonly known as:  PRILOSEC Take 20 mg by mouth daily.   Oxcarbazepine 300 MG tablet Commonly known as:  TRILEPTAL Take 1 tablet (300 mg total) by mouth 2 (two) times daily.   simvastatin 40 MG tablet Commonly known as:  ZOCOR Take 20 mg by mouth at bedtime.   sulfamethoxazole-trimethoprim 800-160 MG tablet Commonly known as:  BACTRIM DS,SEPTRA DS Take 1 tablet by mouth 2 (two) times daily.   tamsulosin 0.4 MG Caps capsule Commonly known as:  FLOMAX Take 2 capsules (0.8 mg total) by mouth at bedtime. What changed:  how much to take   venlafaxine XR 150 MG 24 hr capsule Commonly known as:  EFFEXOR-XR Take 150 mg  by mouth at bedtime.   warfarin 5 MG tablet Commonly known as:  COUMADIN Take 7.5 mg by mouth daily.   zolpidem 10 MG tablet Commonly known as:  AMBIEN Take 10 mg by mouth at bedtime as needed for sleep. Max 3-5 tablets per week.      No Known Allergies Follow-up Information    Benito Mccreedy, MD. Schedule an appointment as soon as possible for a visit in 1 week(s).   Specialty:  Family Medicine Contact  information: [place] [address] [phone]            The results of significant diagnostics from this hospitalization (including imaging, microbiology, ancillary and laboratory) are listed below for reference.    Significant Diagnostic Studies: Dg Chest 2 View  Result Date: 09/30/2017 CLINICAL DATA:  Fever, dyspnea. EXAM: CHEST - 2 VIEW COMPARISON:  05/18/2016 FINDINGS: Stable heart size and mediastinal contours. There is mild aortic atherosclerosis along the transverse portion without aneurysm. Slight uncoiling of the thoracic aorta is noted. Mild central pulmonary vascular congestion is noted. Mild coarsening of interstitial lung markings may reflect subtle changes of interstitial edema. Minimal blunting of the posterior costophrenic angle is identified that may represent atelectasis and/or trace effusion. No alveolar consolidation. No pneumothorax. No acute osseous appearing abnormality. IMPRESSION: Slight coarsening of interstitial lung markings with prominence of central pulmonary vascularity and trace posterior pleural effusions/atelectasis. Findings likely represent a component of mild interstitial edema. Electronically Signed   By: [name] Royalty M.D.   On: 09/30/2017 14:06   Ct Head Wo Contrast  Result Date: 09/30/2017 CLINICAL DATA:  Per ED notes: Pt was found to be driving erratically on [address] and was pulled over. Pt reports that he had left [facility] and was trying to find a spot to turn around to go home. Pt doesn't remember why he went to [facility] this morning. Pt reports he went to the [place] and gave a urine sample and then got a letter in the mail with a prescription that said to take the medication due to a UTI. Pt is alert to self, place and time. Pt denies pain. CBG 107 for EMS. O2 sats 89-90% on RA EXAM: CT HEAD WITHOUT CONTRAST TECHNIQUE: Contiguous axial images were obtained from the base of the skull through the vertex without  intravenous contrast. COMPARISON:  05/18/2016 FINDINGS: Brain: No evidence of acute infarction, hemorrhage, hydrocephalus, extra-axial collection or mass lesion/mass effect. There is mild ventricular and sulcal enlargement reflecting age related volume loss. There is patchy white matter hypoattenuation consistent with mild chronic microvascular ischemic change. These findings are stable. Vascular: No hyperdense vessel or unexpected calcification. Skull: Normal. Negative for fracture or focal lesion. Sinuses/Orbits: Normal globes and orbits. Visualized sinuses and mastoid air cells are clear. Other: None. IMPRESSION: 1. No acute intracranial abnormalities. 2. Age related volume loss. Mild chronic microvascular ischemic change. Stable appearance from the prior study. Electronically Signed   By: Lajean Manes M.D.   On: 09/30/2017 17:21    Microbiology: Recent Results (from the past 240 hour(s))  Blood culture (routine x 2)     Status: None (Preliminary result)   Collection Time: 09/30/17 12:42 PM  Result Value Ref Range Status   Specimen Description   Final    RIGHT ANTECUBITAL BOTTLES DRAWN AEROBIC AND ANAEROBIC   Special Requests Blood Culture adequate volume  Final   Culture   Final    NO GROWTH 2 DAYS Performed at [facility]  [facility], [address], [place], [place] [address]    Report Status PENDING  Incomplete  Blood culture (routine x 2)     Status: None (Preliminary result)   Collection Time: 09/30/17 12:47 PM  Result Value Ref Range Status   Specimen Description   Final    BLOOD LEFT HAND BOTTLES DRAWN AEROBIC AND ANAEROBIC   Special Requests Blood Culture adequate volume  Final   Culture   Final    NO GROWTH 2 DAYS Performed at [facility], [address] [place], [place] [address]    Report Status PENDING  Incomplete     Labs: Basic Metabolic Panel: Recent Labs  Lab 09/30/17 1136 09/30/17 1716 10/01/17 0429  NA 131*  --  138  K 4.1  --  4.1  CL 96*  --  106  CO2 23  --  22   GLUCOSE 105*  --  112*  BUN 20  --  17  CREATININE 1.16  --  1.06  CALCIUM 8.5*  --  8.7*  MG  --  1.8  --   PHOS  --  3.7  --    Liver Function Tests: Recent Labs  Lab 09/30/17 1716  AST 16  ALT 14*  ALKPHOS 61  BILITOT 1.3*  PROT 6.7  ALBUMIN 3.3*    Recent Labs  Lab 09/30/17 1717  AMMONIA 19   CBC: Recent Labs  Lab 09/30/17 1136 10/01/17 0429  WBC 16.0* 10.4  NEUTROABS 13.9*  --   HGB 12.5* 11.5*  HCT 37.5* 34.6*  MCV 91.9 91.8  PLT 237 214    Signed:  Barton Dubois MD.  [facility] 10/02/2017, 12:13 PM

## 2017-10-02 NOTE — Progress Notes (Signed)
ANTICOAGULATION CONSULT NOTE - Initial Consult  Pharmacy Consult for Coumadin Indication: DVT  No Known Allergies  Patient Measurements: Height: 5\' 11"  (180.3 cm) Weight: 218 lb 4.1 oz (99 kg) IBW/kg (Calculated) : 75.3  Vital Signs: Temp: 98.6 F (37 C) (03/10 0651) Temp Source: Oral (03/10 0651) BP: 132/63 (03/10 0651) Pulse Rate: 87 (03/10 0651)  Labs: Recent Labs    09/30/17 1136 09/30/17 1724 10/01/17 0429 10/02/17 0644  HGB 12.5*  --  11.5*  --   HCT 37.5*  --  34.6*  --   PLT 237  --  214  --   LABPROT  --  25.6* 26.9* 24.2*  INR  --  2.36 2.51 2.19  CREATININE 1.16  --  1.06  --     Estimated Creatinine Clearance: 74.4 mL/min (by C-G formula based on SCr of 1.06 mg/dL).   Medical History: Past Medical History:  Diagnosis Date  . GERD (gastroesophageal reflux disease)   . HTN (hypertension)   . Hyperlipidemia   . PTSD (post-traumatic stress disorder)   . Renal disorder   . Seizure ([place])     Medications:  Medications Prior to Admission  Medication Sig Dispense Refill Last Dose  . ARIPiprazole (ABILIFY) 20 MG tablet Take 20 mg by mouth daily.    09/29/2017 at Unknown time  . cetirizine (ZYRTEC) 10 MG tablet Take 10 mg by mouth daily.   09/29/2017 at Unknown time  . diazepam (VALIUM) 5 MG tablet Take 5 mg by mouth daily.    Past Week at Unknown time  . lisinopril (PRINIVIL,ZESTRIL) 40 MG tablet Take 40 mg by mouth every morning.   09/30/2017 at Unknown time  . metoprolol succinate (TOPROL-XL) 25 MG 24 hr tablet Take 25 mg by mouth 2 (two) times daily.    09/30/2017 at 0700  . Multiple Vitamin (MULTIVITAMIN WITH MINERALS) TABS tablet Take 1 tablet by mouth at bedtime.    09/29/2017 at Unknown time  . omeprazole (PRILOSEC) 20 MG capsule Take 20 mg by mouth daily.   09/30/2017 at Unknown time  . OVER THE COUNTER MEDICATION CBD oil: patient takes 10 drops by mouth and apply topically to affected areas daily for arthritis   09/29/2017 at Unknown time  . simvastatin (ZOCOR)  40 MG tablet Take 20 mg by mouth at bedtime.    09/29/2017 at Unknown time  . tamsulosin (FLOMAX) 0.4 MG CAPS capsule Take 0.4 mg by mouth at bedtime.    09/29/2017 at Unknown time  . venlafaxine XR (EFFEXOR-XR) 150 MG 24 hr capsule Take 150 mg by mouth at bedtime.    09/29/2017 at Unknown time  . warfarin (COUMADIN) 5 MG tablet Take 7.5 mg by mouth daily.    09/30/2017 at 0700  . zolpidem (AMBIEN) 10 MG tablet Take 10 mg by mouth at bedtime as needed for sleep. Max 3-5 tablets per week.   Past Week at Unknown time    Assessment: 72 year old male with a past medical history significant for hypertension, hyperlipidemia, PTSD, depression/anxiety, prior history of seizure disorder (no longer using antiepileptics), and hx of DVT; who was brought to the emergency department secondary to confusion and erratic behavior. Pharmacy asked to manage coumadin therapy. INR is therapeutic Home regimen is 7.5mg  daily  Goal of Therapy:  INR 2-3 Monitor platelets by anticoagulation protocol: Yes   Plan:  Coumadin 7.5mg  po x 1 today PT-INR daily Monitor for S/S of bleeding  Isac Sarna, BS Vena [place], BCPS Clinical Pharmacist Pager [phone] 10/02/2017,10:22 AM

## 2017-10-06 LAB — CULTURE, BLOOD (ROUTINE X 2)
Culture: NO GROWTH
Culture: NO GROWTH
Special Requests: ADEQUATE
Special Requests: ADEQUATE

## 2017-12-01 ENCOUNTER — Emergency Department (HOSPITAL_COMMUNITY): Payer: Non-veteran care

## 2017-12-01 ENCOUNTER — Emergency Department (HOSPITAL_COMMUNITY)
Admission: EM | Admit: 2017-12-01 | Discharge: 2017-12-01 | Disposition: A | Discharge status: Home / Self Care | Payer: Non-veteran care | Attending: Emergency Medicine | Admitting: Emergency Medicine

## 2017-12-01 ENCOUNTER — Encounter (HOSPITAL_COMMUNITY): Payer: Self-pay | Admitting: *Deleted

## 2017-12-01 ENCOUNTER — Other Ambulatory Visit: Payer: Self-pay

## 2017-12-01 DIAGNOSIS — F17290 Nicotine dependence, other tobacco product, uncomplicated: Secondary | ICD-10-CM | POA: Insufficient documentation

## 2017-12-01 DIAGNOSIS — Y929 Unspecified place or not applicable: Secondary | ICD-10-CM | POA: Diagnosis not present

## 2017-12-01 DIAGNOSIS — Z79899 Other long term (current) drug therapy: Secondary | ICD-10-CM | POA: Diagnosis not present

## 2017-12-01 DIAGNOSIS — Y999 Unspecified external cause status: Secondary | ICD-10-CM | POA: Insufficient documentation

## 2017-12-01 DIAGNOSIS — Z7901 Long term (current) use of anticoagulants: Secondary | ICD-10-CM | POA: Diagnosis not present

## 2017-12-01 DIAGNOSIS — Y939 Activity, unspecified: Secondary | ICD-10-CM | POA: Insufficient documentation

## 2017-12-01 DIAGNOSIS — I10 Essential (primary) hypertension: Secondary | ICD-10-CM | POA: Insufficient documentation

## 2017-12-01 DIAGNOSIS — Z96611 Presence of right artificial shoulder joint: Secondary | ICD-10-CM | POA: Diagnosis not present

## 2017-12-01 DIAGNOSIS — Z96652 Presence of left artificial knee joint: Secondary | ICD-10-CM | POA: Diagnosis not present

## 2017-12-01 DIAGNOSIS — M545 Low back pain: Secondary | ICD-10-CM | POA: Diagnosis present

## 2017-12-01 DIAGNOSIS — W19XXXA Unspecified fall, initial encounter: Secondary | ICD-10-CM | POA: Diagnosis not present

## 2017-12-01 LAB — CBC WITH DIFFERENTIAL/PLATELET
Basophils Absolute: 0 10*3/uL (ref 0.0–0.1)
Basophils Relative: 0 %
Eosinophils Absolute: 0.2 10*3/uL (ref 0.0–0.7)
Eosinophils Relative: 3 %
HEMATOCRIT: 34.6 % — AB (ref 39.0–52.0)
HEMOGLOBIN: 12.1 g/dL — AB (ref 13.0–17.0)
LYMPHS ABS: 1.6 10*3/uL (ref 0.7–4.0)
LYMPHS PCT: 21 %
MCH: 31.2 pg (ref 26.0–34.0)
MCHC: 35 g/dL (ref 30.0–36.0)
MCV: 89.2 fL (ref 78.0–100.0)
MONOS PCT: 6 %
Monocytes Absolute: 0.5 10*3/uL (ref 0.1–1.0)
NEUTROS PCT: 70 %
Neutro Abs: 5.3 10*3/uL (ref 1.7–7.7)
Platelets: 234 10*3/uL (ref 150–400)
RBC: 3.88 MIL/uL — AB (ref 4.22–5.81)
RDW: 13.1 % (ref 11.5–15.5)
WBC: 7.6 10*3/uL (ref 4.0–10.5)

## 2017-12-01 LAB — BASIC METABOLIC PANEL
Anion gap: 9 (ref 5–15)
BUN: 18 mg/dL (ref 6–20)
CHLORIDE: 96 mmol/L — AB (ref 101–111)
CO2: 26 mmol/L (ref 22–32)
CREATININE: 1.1 mg/dL (ref 0.61–1.24)
Calcium: 9.3 mg/dL (ref 8.9–10.3)
GFR calc Af Amer: >60 mL/min (ref >60)
GFR calc non Af Amer: >60 mL/min (ref >60)
GLUCOSE: 98 mg/dL (ref 65–99)
POTASSIUM: 4.3 mmol/L (ref 3.5–5.1)
Sodium: 131 mmol/L — ABNORMAL LOW (ref 135–145)

## 2017-12-01 MED ORDER — HYDROMORPHONE HCL 2 MG/ML IJ SOLN
1.5000 mg | Freq: Once | INTRAMUSCULAR | Status: AC
  Administered 2017-12-01: 1.5 mg via INTRAVENOUS
  Filled 2017-12-01: qty 1

## 2017-12-01 MED ORDER — ONDANSETRON HCL 4 MG/2ML IJ SOLN
4.0000 mg | Freq: Once | INTRAMUSCULAR | Status: AC
  Administered 2017-12-01: 4 mg via INTRAVENOUS
  Filled 2017-12-01: qty 2

## 2017-12-01 MED ORDER — HYDROMORPHONE HCL 4 MG PO TABS: ORAL_TABLET | ORAL | 0 refills | Status: DC

## 2017-12-01 MED ORDER — HYDROMORPHONE HCL 1 MG/ML IJ SOLN
1.0000 mg | Freq: Once | INTRAMUSCULAR | Status: AC
  Administered 2017-12-01: 1 mg via INTRAVENOUS
  Filled 2017-12-01: qty 1

## 2017-12-01 NOTE — ED Triage Notes (Addendum)
Pt c/o lower back pain and pain in buttocks x 1 month. Pt reports he saw the [facility] last week and was told he had a problem with a disc and needed surgery and was sent home. Pt reports he fell about 3 days ago. Pain started progressing last night and became severe this morning.

## 2017-12-01 NOTE — Discharge Instructions (Addendum)
Follow-up with your doctor next week for recheck if not improving

## 2017-12-01 NOTE — ED Notes (Signed)
Going to xray 

## 2017-12-01 NOTE — ED Notes (Signed)
Pain meds given, pt calling family for a ride home.

## 2017-12-01 NOTE — ED Provider Notes (Signed)
[facility] EMERGENCY DEPARTMENT Provider Note   CSN: [id number] Arrival date & time: 12/01/17  1110     History   Chief Complaint Chief Complaint  Patient presents with  . Back Pain    HPI Nathan [name] is a 72 y.o. male.  Patient has a history of chronic back problems.  Patient fell on his back a few days ago and states the pain is over his lower back and his severe patient does not have any pain down his legs  The history is provided by the patient. No language interpreter was used.  Back Pain   This is a recurrent problem. The current episode started more than 2 days ago. The problem occurs constantly. The problem has not changed since onset.The pain is associated with falling. The pain is present in the lumbar spine. The quality of the pain is described as stabbing. The pain does not radiate. The pain is at a severity of 7/10. The pain is moderate. The symptoms are aggravated by bending. The pain is the same all the time. Pertinent negatives include no chest pain, no headaches and no abdominal pain.    Past Medical History:  Diagnosis Date  . GERD (gastroesophageal reflux disease)   . HTN (hypertension)   . Hyperlipidemia   . PTSD (post-traumatic stress disorder)   . Renal disorder   . Seizure [facility])    last one 2004    Patient Active Problem List   Diagnosis Date Noted  . Acute encephalopathy 09/30/2017  . BPH (benign prostatic hyperplasia) 09/30/2017  . 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 CVA (cerebral vascular accident) ([place]) 05/20/2016  . Word finding difficulty   . Bacteremia 05/19/2016  . Fever 05/18/2016  . Headache 05/18/2016  . Leukocytosis 05/18/2016  . Hypokalemia 03/22/2013  . Sepsis ([place]) 03/21/2013  . Thrombocytopenia ([place]) 03/21/2013  . Acidosis 03/21/2013  . Tachycardia 03/21/2013  . UTI (lower urinary tract infection) 03/21/2013  . Hyponatremia 03/21/2013  . Seizure ([place])   . PTSD (post-traumatic stress disorder)   . HTN (hypertension)   .  GERD (gastroesophageal reflux disease)     Past Surgical History:  Procedure Laterality Date  . JOINT REPLACEMENT     Left knee, R shoulder        Home Medications    Prior to Admission medications   Medication Sig Start Date End Date Taking? Authorizing Provider  ARIPiprazole (ABILIFY) 20 MG tablet Take 20 mg by mouth daily.     [provider]  cetirizine (ZYRTEC) 10 MG tablet Take 10 mg by mouth daily.    [provider]  diazepam (VALIUM) 5 MG tablet Take 5 mg by mouth daily.     [provider]  fluticasone (FLONASE) 50 MCG/ACT nasal spray Place 1 spray into both nostrils daily. 10/02/17   Barton Dubois, MD  HYDROmorphone (DILAUDID) 4 MG tablet Take one every 4-6 hours for pain 12/01/17   Milton Ferguson, MD  lisinopril (PRINIVIL,ZESTRIL) 40 MG tablet Take 0.5 tablets (20 mg total) by mouth every morning. 10/02/17   Barton Dubois, MD  metoprolol succinate (TOPROL-XL) 25 MG 24 hr tablet Take 25 mg by mouth 2 (two) times daily.     [provider]  Multiple Vitamin (MULTIVITAMIN WITH MINERALS) TABS tablet Take 1 tablet by mouth at bedtime.     [provider]  omeprazole (PRILOSEC) 20 MG capsule Take 20 mg by mouth daily.    [provider]  Oxcarbazepine (TRILEPTAL) 300 MG tablet Take 1  tablet (300 mg total) by mouth 2 (two) times daily. 10/02/17   Barton Dubois, MD  simvastatin (ZOCOR) 40 MG tablet Take 20 mg by mouth at bedtime.     [provider]  sulfamethoxazole-trimethoprim (BACTRIM DS,SEPTRA DS) 800-160 MG tablet Take 1 tablet by mouth 2 (two) times daily. 10/02/17   Barton Dubois, MD  tamsulosin (FLOMAX) 0.4 MG CAPS capsule Take 2 capsules (0.8 mg total) by mouth at bedtime. 10/02/17   Barton Dubois, MD  venlafaxine XR (EFFEXOR-XR) 150 MG 24 hr capsule Take 150 mg by mouth at bedtime.     [provider]  warfarin (COUMADIN) 5 MG tablet Take 7.5 mg by mouth daily.     [provider]  zolpidem  (AMBIEN) 10 MG tablet Take 10 mg by mouth at bedtime as needed for sleep. Max 3-5 tablets per week.    [provider]    Family History No family history on file.  Social History Social History   Tobacco Use  . Smoking status: Current Every Day Smoker    Types: Pipe  . Smokeless tobacco: Never Used  Substance Use Topics  . Alcohol use: No  . Drug use: No     Allergies   Terazosin   Review of Systems Review of Systems  Constitutional: Negative for appetite change and fatigue.  HENT: Negative for congestion, ear discharge and sinus pressure.   Eyes: Negative for discharge.  Respiratory: Negative for cough.   Cardiovascular: Negative for chest pain.  Gastrointestinal: Negative for abdominal pain and diarrhea.  Genitourinary: Negative for frequency and hematuria.  Musculoskeletal: Positive for back pain.  Skin: Negative for rash.  Neurological: Negative for seizures and headaches.  Psychiatric/Behavioral: Negative for hallucinations.     Physical Exam Updated Vital Signs BP 121/65   Pulse 95   Temp 98.5 F (36.9 C) (Oral)   Resp 18   Ht 5\' 11"  (1.803 m)   Wt 97.5 kg (215 lb)   SpO2 (!) 89%   BMI 29.99 kg/m   Physical Exam  Constitutional: He is oriented to person, place, and time. He appears well-developed.  HENT:  Head: Normocephalic.  Eyes: Conjunctivae and EOM are normal. No scleral icterus.  Neck: Neck supple. No thyromegaly present.  Cardiovascular: Normal rate and regular rhythm. Exam reveals no gallop and no friction rub.  No murmur heard. Pulmonary/Chest: No stridor. He has no wheezes. He has no rales. He exhibits no tenderness.  Abdominal: He exhibits no distension. There is no tenderness. There is no rebound.  Musculoskeletal: Normal range of motion. He exhibits no edema.  Tender sacrum, negative straight leg raise  Lymphadenopathy:    He has no cervical adenopathy.  Neurological: He is oriented to person, place, and time. He exhibits  normal muscle tone. Coordination normal.  Skin: No rash noted. No erythema.  Psychiatric: He has a normal mood and affect. His behavior is normal.     ED Treatments / Results  Labs (all labs ordered are listed, but only abnormal results are displayed) Labs Reviewed  CBC WITH DIFFERENTIAL/PLATELET - Abnormal; Notable for the following components:      Result Value   RBC 3.88 (*)    Hemoglobin 12.1 (*)    HCT 34.6 (*)    All other components within normal limits  BASIC METABOLIC PANEL - Abnormal; Notable for the following components:   Sodium 131 (*)    Chloride 96 (*)    All other components within normal limits    EKG  None  Radiology Dg Lumbar Spine Complete  Result Date: 12/01/2017 CLINICAL DATA:  Low back pain after multiple falls. EXAM: LUMBAR SPINE - COMPLETE 4+ VIEW; SACRUM AND COCCYX - 2+ VIEW COMPARISON:  None. FINDINGS: Five lumbar type vertebral bodies. No acute fracture or subluxation. Vertebral body heights are preserved. Trace retrolisthesis at L3-L4. Trace anterolisthesis at L4-L5. Moderate disc height loss at L2-L3. Severe disc height loss and facet arthropathy at L4-L5 and L5-S1. Degenerative changes of the lower thoracic spine. The sacroiliac joints and pubic symphysis are intact. Aortoiliac atherosclerotic vascular disease. IMPRESSION: 1. No acute osseous abnormality. 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 Electronically Signed   By: Titus Dubin M.D.   On: 12/01/2017 13:31   Dg Sacrum/coccyx  Result Date: 12/01/2017 CLINICAL DATA:  Low back pain after multiple falls. EXAM: LUMBAR SPINE - COMPLETE 4+ VIEW; SACRUM AND COCCYX - 2+ VIEW COMPARISON:  None. FINDINGS: Five lumbar type vertebral bodies. No acute fracture or subluxation. Vertebral body heights are preserved. Trace retrolisthesis at L3-L4. Trace anterolisthesis at L4-L5. Moderate disc height loss at L2-L3. Severe disc height loss and facet arthropathy at L4-L5 and L5-S1. Degenerative changes  of the lower thoracic spine. The sacroiliac joints and pubic symphysis are intact. Aortoiliac atherosclerotic vascular disease. IMPRESSION: 1. No acute osseous abnormality. 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 Electronically Signed   By: Titus Dubin M.D.   On: 12/01/2017 13:31    Procedures Procedures (including critical care time)  Medications Ordered in ED Medications  HYDROmorphone (DILAUDID) injection 1.5 mg (has no administration in time range)  HYDROmorphone (DILAUDID) injection 1 mg (1 mg Intravenous Given 12/01/17 1150)  ondansetron (ZOFRAN) injection 4 mg (4 mg Intravenous Given 12/01/17 1148)     Initial Impression / Assessment and Plan / ED Course  I have reviewed the triage vital signs and the nursing notes.  Pertinent labs & imaging results that were available during my care of the patient were reviewed by me and considered in my medical decision making (see chart for details).     Patient with a fall and contusion to his sacral area.  X-rays show arthritis but no fractures.  He will be discharged home with Dilaudid and will follow-up with his PCP Final Clinical Impressions(s) / ED Diagnoses   Final diagnoses:  Fall, initial encounter    ED Discharge Orders        Ordered    HYDROmorphone (DILAUDID) 4 MG tablet     12/01/17 1447       Milton Ferguson, MD 12/01/17 1452

## 2017-12-01 NOTE — ED Notes (Signed)
Pt yelling out in pain, wanting pillow under feet. Pt has strong odor of urine

## 2017-12-01 NOTE — ED Notes (Signed)
Patient given discharge instruction, verbalized understand. IV removed, band aid applied. Patient ambulatory out of the department.  

## 2018-04-25 ENCOUNTER — Ambulatory Visit (INDEPENDENT_AMBULATORY_CARE_PROVIDER_SITE_OTHER): Payer: PRIVATE HEALTH INSURANCE | Admitting: Allergy and Immunology

## 2018-04-25 ENCOUNTER — Encounter: Payer: Self-pay | Admitting: Allergy and Immunology

## 2018-04-25 VITALS — BP 110/74 | HR 60 | Resp 16 | Ht 71.0 in | Wt 219.0 lb

## 2018-04-25 DIAGNOSIS — R05 Cough: Secondary | ICD-10-CM | POA: Diagnosis not present

## 2018-04-25 DIAGNOSIS — K2970 Gastritis, unspecified, without bleeding: Secondary | ICD-10-CM | POA: Diagnosis not present

## 2018-04-25 DIAGNOSIS — R062 Wheezing: Secondary | ICD-10-CM

## 2018-04-25 DIAGNOSIS — R059 Cough, unspecified: Secondary | ICD-10-CM | POA: Insufficient documentation

## 2018-04-25 DIAGNOSIS — L500 Allergic urticaria: Secondary | ICD-10-CM

## 2018-04-25 DIAGNOSIS — J3089 Other allergic rhinitis: Secondary | ICD-10-CM | POA: Diagnosis not present

## 2018-04-25 MED ORDER — ALBUTEROL SULFATE HFA 108 (90 BASE) MCG/ACT IN AERS: 2.0000 | INHALATION_SPRAY | Freq: Four times a day (QID) | RESPIRATORY_TRACT | 2 refills | Status: AC | PRN

## 2018-04-25 MED ORDER — AZELASTINE HCL 0.1 % NA SOLN: 2.0000 | Freq: Two times a day (BID) | NASAL | 2 refills | Status: DC

## 2018-04-25 MED ORDER — FEXOFENADINE HCL 180 MG PO TABS: 180.0000 mg | ORAL_TABLET | Freq: Every day | ORAL | 2 refills | Status: DC

## 2018-04-25 MED ORDER — FAMOTIDINE 20 MG PO TABS: 20.0000 mg | ORAL_TABLET | Freq: Two times a day (BID) | ORAL | 2 refills | Status: AC

## 2018-04-25 NOTE — Patient Instructions (Addendum)
Recurrent pruritus/urticaria Unclear etiology. Skin tests to select food allergens were negative today.  Environmental skin tests were positive to grass pollen, molds, cat hair, dog epithelia, and dust mite antigen, therefore this may represent pruritus/urticaria secondary to aeroallergen exposure.  NSAIDs and emotional stress commonly exacerbate urticaria but are not the underlying etiology in this case. Physical urticarias are negative by history (i.e. pressure-induced, temperature, vibration, solar, etc.). History and lesions are not consistent with urticaria pigmentosa so I am not suspicious for mastocytosis. There are no concomitant symptoms concerning for anaphylaxis or constitutional symptoms worrisome for an underlying malignancy. We will rule out other potential etiologies with labs. For symptom relief, patient is to take oral antihistamines as directed.  The following labs have been ordered: FCeRI antibody, anti-thyroglobulin antibody, thyroid peroxidase antibody, tryptase, urea breath test, CBC, CMP, ESR, ANA, and galactose-alpha-1,3-galactose IgE level.  The patient will be called with further recommendations after lab results have returned.  Instructions have been discussed and provided for H1/H2 receptor blockade with titration to find lowest effective dose.  A prescription has been provided for fexofenadine (Allegra) 180 mg daily as needed.  A prescription has been provided for famotidine (Pepcid) 20 mg twice daily as needed.  Should there be a significant increase or change in symptoms, a journal is to be kept recording any foods eaten, beverages consumed, medications taken within a 6 hour period prior to the onset of symptoms, as well as record activities being performed, and environmental conditions. For any symptoms concerning for anaphylaxis, 911 is to be called immediately.  Seasonal and perennial allergic rhinitis  Aeroallergen avoidance measures have been discussed and  provided in written form.  Fexofenadine (Allegra) has been prescribed (as above).  A prescription has been provided for azelastine nasal spray, 1-2 sprays per nostril 2 times daily as needed. Proper nasal spray technique has been discussed and demonstrated.   Nasal saline spray (i.e., Simply Saline) or nasal saline lavage (i.e., NeilMed) is recommended as needed and prior to medicated nasal sprays.  If allergen avoidance measures and medications fail to adequately relieve symptoms, aeroallergen immunotherapy will be considered.  Wheezing/dyspnea The patient's history suggest mild intermittent asthma.  Spirometry today revealed normal ventilatory function while asymptomatic.  A prescription has been provided for albuterol HFA, 1 to 2 inhalations every 4-6 hours if needed.  Subjective and objective measures of pulmonary function will be followed and the treatment plan will be adjusted accordingly.   When lab results have returned the patient will be called with further recommendations and follow up instructions.   Urticaria (Hives)  . Fexofenadine (Allegra) 180 mg once a day.  If symptoms continue then increase to .  Marland Kitchen Fexofenadine (Allegra) 180 mg  twice a day.  If symptoms continue then increase to .  Marland Kitchen Fexofenadine (Allegra) 180 mg  twice a day and famotidine (Pepcid) 20 mg once a day.  If symptoms continue then increase to.  Marland Kitchen Fexofenadine (Allegra) 180 mg  twice a day and famotidine (Pepcid) 20 mg twice a day  May use Benadryl as needed for breakthrough symptoms       If no symptoms for 7 days, then step down dosage  Control of Dust Mite Allergen  House dust mites play a major role in allergic asthma and rhinitis.  They occur in environments with high humidity wherever human skin, the food for dust mites is found. High levels have been detected in dust obtained from mattresses, pillows, carpets, upholstered furniture, bed covers, clothes and soft toys.  The principal allergen  of the house dust mite is found in its feces.  A gram of dust may contain 1,000 mites and 250,000 fecal particles.  Mite antigen is easily measured in the air during house cleaning activities.    1. Encase mattresses, including the box spring, and pillow, in an air tight cover.  Seal the zipper end of the encased mattresses with wide adhesive tape. 2. Wash the bedding in water of 130 degrees Farenheit weekly.  Avoid cotton comforters/quilts and flannel bedding: the most ideal bed covering is the dacron comforter. 3. Remove all upholstered furniture from the bedroom. 4. Remove carpets, carpet padding, rugs, and non-washable window drapes from the bedroom.  Wash drapes weekly or use plastic window coverings. 5. Remove all non-washable stuffed toys from the bedroom.  Wash stuffed toys weekly. 6. Have the room cleaned frequently with a vacuum cleaner and a damp dust-mop.  The patient should not be in a room which is being cleaned and should wait 1 hour after cleaning before going into the room. 7. Close and seal all heating outlets in the bedroom.  Otherwise, the room will become filled with dust-laden air.  An electric heater can be used to heat the room. Reduce indoor humidity to less than 50%.  Do not use a humidifier.   Reducing Pollen Exposure  The American Academy of Allergy, Asthma and Immunology suggests the following steps to reduce your exposure to pollen during allergy seasons.    1. Do not hang sheets or clothing out to dry; pollen may collect on these items. 2. Do not mow lawns or spend time around freshly cut grass; mowing stirs up pollen. 3. Keep windows closed at night.  Keep car windows closed while driving. 4. Minimize morning activities outdoors, a time when pollen counts are usually at their highest. 5. Stay indoors as much as possible when pollen counts or humidity is high and on windy days when pollen tends to remain in the air longer. 6. Use air conditioning when possible.   Many air conditioners have filters that trap the pollen spores. 7. Use a HEPA room air filter to remove pollen form the indoor air you breathe.   Control of Dog or Cat Allergen  Avoidance is the best way to manage a dog or cat allergy. If you have a dog or cat and are allergic to dog or cats, consider removing the dog or cat from the home. If you have a dog or cat but don't want to find it a new home, or if your family wants a pet even though someone in the household is allergic, here are some strategies that may help keep symptoms at bay:  1. Keep the pet out of your bedroom and restrict it to only a few rooms. Be advised that keeping the dog or cat in only one room will not limit the allergens to that room. 2. Don't pet, hug or kiss the dog or cat; if you do, wash your hands with soap and water. 3. High-efficiency particulate air (HEPA) cleaners run continuously in a bedroom or living room can reduce allergen levels over time. 4. Place electrostatic material sheet in the air inlet vent in the bedroom. 5. Regular use of a high-efficiency vacuum cleaner or a central vacuum can reduce allergen levels. 6. Giving your dog or cat a bath at least once a week can reduce airborne allergen.   Control of Mold Allergen  Mold and fungi can grow on a variety of  surfaces provided certain temperature and moisture conditions exist.  Outdoor molds grow on plants, decaying vegetation and soil.  The major outdoor mold, Alternaria and Cladosporium, are found in very high numbers during hot and dry conditions.  Generally, a late Summer - Fall peak is seen for common outdoor fungal spores.  Rain will temporarily lower outdoor mold spore count, but counts rise rapidly when the rainy period ends.  The most important indoor molds are Aspergillus and Penicillium.  Dark, humid and poorly ventilated basements are ideal sites for mold growth.  The next most common sites of mold growth are the bathroom and the  kitchen.  Outdoor [employer] 1. Use air conditioning and keep windows closed 2. Avoid exposure to decaying vegetation. 3. Avoid leaf raking. 4. Avoid grain handling. 5. Consider wearing a face mask if working in moldy areas.  Indoor Mold Control 1. Maintain humidity below 50%. 2. Clean washable surfaces with 5% bleach solution. 3. Remove sources e.g. Contaminated carpets.

## 2018-04-25 NOTE — Progress Notes (Signed)
New Patient Note  RE: Nathan [name] MRN: [id number] DOB: Jun 22, 1946 Date of Office Visit: 04/25/2018  Referring provider: Benito Mccreedy, MD Primary care provider: Benito Mccreedy, MD  Chief Complaint: Urticaria; Nasal Congestion; and Cough   History of present illness: Nathan [name] is a 72 y.o. male seen today in consultation requested by Benito Mccreedy, MD.  He reports that over the past 30 years when the hair on his head gets too long and touches his forehead or eyelids and causes watering of the eyes, swelling of the eyelids, and hives on his face.  He denies the use of product on his hair.  He has controlled this problem over the past 30 years by keeping his hair cut short.  However, over the past 12 months, Nathan [name] has experienced recurrent episodes of generalized pruritus plus/minus hives. The hives have appeared at different times over his entire body.  The lesions are described as erythematous, raised, and pruritic.  Individual hives last less than 24 hours without leaving residual pigmentation or bruising. He has experienced associated angioedema of the eyelids on rare occasions but denies concomitant cardiopulmonary or GI symptoms. He has not experienced unexpected weight loss, recurrent fevers or drenching night sweats. No specific medication, food, skin care product, detergent, soap, or other environmental triggers have been identified. The symptoms do not seem to correlate with NSAIDs use or emotional stress. He did not have symptoms consistent with a respiratory tract infection at the time of symptom onset. Nathan [name] has tried to control symptoms with OTC antihistamines which have offered adequate temporary relief. He has not been evaluated and treated in the emergency department for these symptoms. Skin biopsy has not been performed. Recently, he has been experiencing hives daily. He experiences nasal congestion, sneezing, rhinorrhea, postnasal drainage, and coughing.  These symptoms  occur year-round but may occur more frequently during the springtime and fall.  The cough is described as originating as a tickle in the base of his throat which he believes is secondary to postnasal drainage.  He denies heartburn.  He does admit to occasional dyspnea, chest tightness, and wheezing.  These lower respiratory symptoms occur 1 time per week on average while at rest.  He does not experience nocturnal awakenings or limitations in normal daily activities due to lower respiratory symptoms.  Assessment and plan: Recurrent pruritus/urticaria Unclear etiology. Skin tests to select food allergens were negative today.  Environmental skin tests were positive to grass pollen, molds, cat hair, dog epithelia, and dust mite antigen, therefore this may represent pruritus/urticaria secondary to aeroallergen exposure.  NSAIDs and emotional stress commonly exacerbate urticaria but are not the underlying etiology in this case. Physical urticarias are negative by history (i.e. pressure-induced, temperature, vibration, solar, etc.). History and lesions are not consistent with urticaria pigmentosa so I am not suspicious for mastocytosis. There are no concomitant symptoms concerning for anaphylaxis or constitutional symptoms worrisome for an underlying malignancy. We will rule out other potential etiologies with labs. For symptom relief, patient is to take oral antihistamines as directed.  The following labs have been ordered: FCeRI antibody, anti-thyroglobulin antibody, thyroid peroxidase antibody, tryptase, urea breath test, CBC, CMP, ESR, ANA, and galactose-alpha-1,3-galactose IgE level.  The patient will be called with further recommendations after lab results have returned.  Instructions have been discussed and provided for H1/H2 receptor blockade with titration to find lowest effective dose.  A prescription has been provided for fexofenadine (Allegra) 180 mg daily as needed.  A prescription has been  provided  for famotidine (Pepcid) 20 mg twice daily as needed.  Should there be a significant increase or change in symptoms, a journal is to be kept recording any foods eaten, beverages consumed, medications taken within a 6 hour period prior to the onset of symptoms, as well as record activities being performed, and environmental conditions. For any symptoms concerning for anaphylaxis, 911 is to be called immediately.  Seasonal and perennial allergic rhinitis  Aeroallergen avoidance measures have been discussed and provided in written form.  Fexofenadine (Allegra) has been prescribed (as above).  A prescription has been provided for azelastine nasal spray, 1-2 sprays per nostril 2 times daily as needed. Proper nasal spray technique has been discussed and demonstrated.   Nasal saline spray (i.e., Simply Saline) or nasal saline lavage (i.e., NeilMed) is recommended as needed and prior to medicated nasal sprays.  If allergen avoidance measures and medications fail to adequately relieve symptoms, aeroallergen immunotherapy will be considered.  Wheezing/dyspnea The patient's history suggest mild intermittent asthma.  Spirometry today revealed normal ventilatory function while asymptomatic.  A prescription has been provided for albuterol HFA, 1 to 2 inhalations every 4-6 hours if needed.  Subjective and objective measures of pulmonary function will be followed and the treatment plan will be adjusted accordingly.   Meds ordered this encounter  Medications  . fexofenadine (ALLEGRA) 180 MG tablet    Sig: Take 1 tablet (180 mg total) by mouth daily.    Dispense:  90 tablet    Refill:  2  . famotidine (PEPCID) 20 MG tablet    Sig: Take 1 tablet (20 mg total) by mouth 2 (two) times daily.    Dispense:  180 tablet    Refill:  2  . azelastine (ASTELIN) 0.1 % nasal spray    Sig: Place 2 sprays into both nostrils 2 (two) times daily.    Dispense:  90 mL    Refill:  2  . albuterol (PROVENTIL  HFA;VENTOLIN HFA) 108 (90 Base) MCG/ACT inhaler    Sig: Inhale 2 puffs into the lungs every 6 (six) hours as needed for wheezing or shortness of breath.    Dispense:  1 Inhaler    Refill:  2    Diagnostics: Spirometry:  Normal with an FEV1 of 97% predicted.  Please see scanned spirometry results for details. Environmental epicutaneous testing: Negative despite a positive histamine control. Environmental intradermal testing: Positive to grass pollen, molds, cat hair, dog epithelia, and dust mite antigen. Food allergen skin testing: Negative despite a positive histamine control.    Physical examination: Blood pressure 110/74, pulse 60, resp. rate 16, height '5\' 11"'  (1.803 m), weight 219 lb (99.3 kg), SpO2 98 %.  General: Alert, interactive, in no acute distress. HEENT: TMs pearly gray, turbinates moderately edematous without discharge, post-pharynx mildly erythematous. Neck: Supple without lymphadenopathy. Lungs: Clear to auscultation without wheezing, rhonchi or rales. CV: Normal S1, S2 without murmurs. Abdomen: Nondistended, nontender. Skin: Warm and dry, without lesions or rashes. Extremities:  No clubbing, cyanosis or edema. Neuro:   Grossly intact.  Review of systems:  Review of systems negative except as noted in HPI / PMHx or noted below: Review of Systems  Constitutional: Negative.   HENT: Negative.   Eyes: Negative.   Respiratory: Negative.   Cardiovascular: Negative.   Gastrointestinal: Negative.   Genitourinary: Negative.   Musculoskeletal: Negative.   Skin: Negative.   Neurological: Negative.   Endo/Heme/Allergies: Negative.   Psychiatric/Behavioral: Negative.     Past medical history:  Past Medical History:  Diagnosis Date  . GERD (gastroesophageal reflux disease)   . HTN (hypertension)   . Hyperlipidemia   . PTSD (post-traumatic stress disorder)   . Renal disorder   . Seizure [facility])    last one 2004    Past surgical history:  Past Surgical History:    Procedure Laterality Date  . JOINT REPLACEMENT     Left knee, R shoulder  . SHOULDER ARTHROSCOPY Left 2016    Family history: Family History  Family history unknown: Yes    Social history: Social History   Socioeconomic History  . Marital status: Married    Spouse name: Not on file  . Number of children: Not on file  . Years of education: Not on file  . Highest education level: Not on file  Occupational History  . Not on file  Social Needs  . Financial resource strain: Not on file  . Food insecurity:    Worry: Not on file    Inability: Not on file  . Transportation needs:    Medical: Not on file    Non-medical: Not on file  Tobacco Use  . Smoking status: Current Every Day Smoker    Years: 20.00    Types: Pipe  . Smokeless tobacco: Never Used  Substance and Sexual Activity  . Alcohol use: No  . Drug use: No  . Sexual activity: Not on file  Lifestyle  . Physical activity:    Days per week: Not on file    Minutes per session: Not on file  . Stress: Not on file  Relationships  . Social connections:    Talks on phone: Not on file    Gets together: Not on file    Attends religious service: Not on file    Active member of club or organization: Not on file    Attends meetings of clubs or organizations: Not on file    Relationship status: Not on file  . Intimate partner violence:    Fear of current or ex partner: Not on file    Emotionally abused: Not on file    Physically abused: Not on file    Forced sexual activity: Not on file  Other Topics Concern  . Not on file  Social History Narrative  . Not on file   Environmental History: Patient lives in a house with hardwood floors throughout and central air/heat.  There are dogs in the home which have access to his bedroom.  There is no known mold/water damage in the home.  He smokes a pipe and has done so since 1960.  Allergies as of 04/25/2018      Reactions   Terazosin Other (See Comments)   Syncope       Medication List        Accurate as of 04/25/18  6:02 PM. Always use your most recent med list.          albuterol 108 (90 Base) MCG/ACT inhaler Commonly known as:  PROVENTIL HFA;VENTOLIN HFA Inhale 2 puffs into the lungs every 6 (six) hours as needed for wheezing or shortness of breath.   ARIPiprazole 20 MG tablet Commonly known as:  ABILIFY Take 20 mg by mouth daily.   azelastine 0.1 % nasal spray Commonly known as:  ASTELIN Place 2 sprays into both nostrils 2 (two) times daily.   cetirizine 10 MG tablet Commonly known as:  ZYRTEC Take 10 mg by mouth daily.   diazepam 5 MG tablet Commonly known as:  VALIUM Take 5  mg by mouth daily.   ELIQUIS 2.5 MG Tabs tablet Generic drug:  apixaban Take 2.5 mg by mouth 2 (two) times daily.   famotidine 20 MG tablet Commonly known as:  PEPCID Take 1 tablet (20 mg total) by mouth 2 (two) times daily.   fexofenadine 180 MG tablet Commonly known as:  ALLEGRA Take 1 tablet (180 mg total) by mouth daily.   fluticasone 50 MCG/ACT nasal spray Commonly known as:  FLONASE Place 1 spray into both nostrils daily.   lisinopril 40 MG tablet Commonly known as:  PRINIVIL,ZESTRIL Take 0.5 tablets (20 mg total) by mouth every morning.   metoprolol succinate 25 MG 24 hr tablet Commonly known as:  TOPROL-XL Take 25 mg by mouth 2 (two) times daily.   multivitamin with minerals Tabs tablet Take 1 tablet by mouth at bedtime.   omeprazole 20 MG capsule Commonly known as:  PRILOSEC Take 20 mg by mouth daily.   Oxcarbazepine 300 MG tablet Commonly known as:  TRILEPTAL Take 1 tablet (300 mg total) by mouth 2 (two) times daily.   simvastatin 40 MG tablet Commonly known as:  ZOCOR Take 20 mg by mouth at bedtime.   tamsulosin 0.4 MG Caps capsule Commonly known as:  FLOMAX Take 2 capsules (0.8 mg total) by mouth at bedtime.   venlafaxine XR 150 MG 24 hr capsule Commonly known as:  EFFEXOR-XR Take 150 mg by mouth at bedtime.   zolpidem  10 MG tablet Commonly known as:  AMBIEN Take 10 mg by mouth at bedtime as needed for sleep. Max 3-5 tablets per week.       Known medication allergies: Allergies  Allergen Reactions  . Terazosin Other (See Comments)    Syncope    I appreciate the opportunity to take part in Journey's care. Please do not hesitate to contact me with questions.  Sincerely,   R. Edgar Frisk, MD

## 2018-04-25 NOTE — Assessment & Plan Note (Addendum)
Unclear etiology. Skin tests to select food allergens were negative today.  Environmental skin tests were positive to grass pollen, molds, cat hair, dog epithelia, and dust mite antigen, therefore this may represent pruritus/urticaria secondary to aeroallergen exposure.  NSAIDs and emotional stress commonly exacerbate urticaria but are not the underlying etiology in this case. Physical urticarias are negative by history (i.e. pressure-induced, temperature, vibration, solar, etc.). History and lesions are not consistent with urticaria pigmentosa so I am not suspicious for mastocytosis. There are no concomitant symptoms concerning for anaphylaxis or constitutional symptoms worrisome for an underlying malignancy. We will rule out other potential etiologies with labs. For symptom relief, patient is to take oral antihistamines as directed.  The following labs have been ordered: FCeRI antibody, anti-thyroglobulin antibody, thyroid peroxidase antibody, tryptase, urea breath test, CBC, CMP, ESR, ANA, and galactose-alpha-1,3-galactose IgE level.  The patient will be called with further recommendations after lab results have returned.  Instructions have been discussed and provided for H1/H2 receptor blockade with titration to find lowest effective dose.  A prescription has been provided for fexofenadine (Allegra) 180 mg daily as needed.  A prescription has been provided for famotidine (Pepcid) 20 mg twice daily as needed.  Should there be a significant increase or change in symptoms, a journal is to be kept recording any foods eaten, beverages consumed, medications taken within a 6 hour period prior to the onset of symptoms, as well as record activities being performed, and environmental conditions. For any symptoms concerning for anaphylaxis, 911 is to be called immediately.

## 2018-04-25 NOTE — Assessment & Plan Note (Signed)
   Aeroallergen avoidance measures have been discussed and provided in written form.  Fexofenadine (Allegra) has been prescribed (as above).  A prescription has been provided for azelastine nasal spray, 1-2 sprays per nostril 2 times daily as needed. Proper nasal spray technique has been discussed and demonstrated.   Nasal saline spray (i.e., Simply Saline) or nasal saline lavage (i.e., NeilMed) is recommended as needed and prior to medicated nasal sprays.  If allergen avoidance measures and medications fail to adequately relieve symptoms, aeroallergen immunotherapy will be considered.

## 2018-04-25 NOTE — Assessment & Plan Note (Signed)
The patient's history suggest mild intermittent asthma.  Spirometry today revealed normal ventilatory function while asymptomatic.  A prescription has been provided for albuterol HFA, 1 to 2 inhalations every 4-6 hours if needed.  Subjective and objective measures of pulmonary function will be followed and the treatment plan will be adjusted accordingly.

## 2018-05-16 LAB — THYROGLOBULIN ANTIBODY: Thyroglobulin Antibody: <1 IU/mL (ref 0.0–0.9)

## 2018-05-16 LAB — COMPREHENSIVE METABOLIC PANEL
ALT: 14 IU/L (ref 0–44)
AST: 20 IU/L (ref 0–40)
Albumin/Globulin Ratio: 1.6 (ref 1.2–2.2)
Albumin: 4.6 g/dL (ref 3.5–4.8)
Alkaline Phosphatase: 81 IU/L (ref 39–117)
BUN/Creatinine Ratio: 15 (ref 10–24)
BUN: 17 mg/dL (ref 8–27)
Bilirubin Total: 0.3 mg/dL (ref 0.0–1.2)
CO2: 22 mmol/L (ref 20–29)
Calcium: 9.4 mg/dL (ref 8.6–10.2)
Chloride: 96 mmol/L (ref 96–106)
Creatinine, Ser: 1.11 mg/dL (ref 0.76–1.27)
GFR calc Af Amer: 76 mL/min/{1.73_m2} (ref >59)
GFR calc non Af Amer: 66 mL/min/{1.73_m2} (ref >59)
Globulin, Total: 2.8 g/dL (ref 1.5–4.5)
Glucose: 116 mg/dL — ABNORMAL HIGH (ref 65–99)
Potassium: 5.2 mmol/L (ref 3.5–5.2)
Sodium: 138 mmol/L (ref 134–144)
Total Protein: 7.4 g/dL (ref 6.0–8.5)

## 2018-05-16 LAB — ALPHA-GAL PANEL
Alpha Gal IgE*: 36.4 kU/L — ABNORMAL HIGH (ref <0.10)
Beef (Bos spp) IgE: 2.38 kU/L — ABNORMAL HIGH (ref <0.35)
Class Interpretation: 1
Class Interpretation: 2
Lamb/Mutton (Ovis spp) IgE: 0.32 kU/L (ref <0.35)
Pork (Sus spp) IgE: 0.52 kU/L — ABNORMAL HIGH (ref <0.35)

## 2018-05-16 LAB — CBC WITH DIFFERENTIAL/PLATELET
Basophils Absolute: 0 10*3/uL (ref 0.0–0.2)
Basos: 0 %
EOS (ABSOLUTE): 0.2 10*3/uL (ref 0.0–0.4)
Eos: 3 %
Hematocrit: 40.1 % (ref 37.5–51.0)
Hemoglobin: 13.6 g/dL (ref 13.0–17.7)
Immature Grans (Abs): 0 10*3/uL (ref 0.0–0.1)
Immature Granulocytes: 0 %
Lymphocytes Absolute: 2.1 10*3/uL (ref 0.7–3.1)
Lymphs: 29 %
MCH: 31.9 pg (ref 26.6–33.0)
MCHC: 33.9 g/dL (ref 31.5–35.7)
MCV: 94 fL (ref 79–97)
Monocytes Absolute: 0.5 10*3/uL (ref 0.1–0.9)
Monocytes: 6 %
Neutrophils Absolute: 4.5 10*3/uL (ref 1.4–7.0)
Neutrophils: 62 %
Platelets: 240 10*3/uL (ref 150–450)
RBC: 4.26 x10E6/uL (ref 4.14–5.80)
RDW: 12.5 % (ref 12.3–15.4)
WBC: 7.2 10*3/uL (ref 3.4–10.8)

## 2018-05-16 LAB — SEDIMENTATION RATE: Sed Rate: 43 mm/hr — ABNORMAL HIGH (ref 0–30)

## 2018-05-16 LAB — THYROID PEROXIDASE ANTIBODY: Thyroperoxidase Ab SerPl-aCnc: 18 IU/mL (ref 0–34)

## 2018-05-16 LAB — ANA W/REFLEX: Anti Nuclear Antibody(ANA): NEGATIVE

## 2018-05-16 LAB — CHRONIC URTICARIA: cu index: <1.4 (ref <10)

## 2018-05-18 ENCOUNTER — Other Ambulatory Visit: Payer: Self-pay | Admitting: *Deleted

## 2018-05-18 MED ORDER — EPINEPHRINE 0.3 MG/0.3ML IJ SOAJ: 0.3000 mg | Freq: Once | INTRAMUSCULAR | 2 refills | Status: AC

## 2018-06-27 ENCOUNTER — Telehealth: Payer: Self-pay | Admitting: Allergy and Immunology

## 2018-06-27 NOTE — Telephone Encounter (Signed)
Pt called and would like to talk with a nurse, has some questions. [phone].

## 2018-06-27 NOTE — Telephone Encounter (Signed)
Hey Candice,  Could you please send pt a copy of is lab results of his last labs drawn? Pt called and requested them.

## 2018-06-28 NOTE — Telephone Encounter (Signed)
Labs are in [facility] mail today waiting for pick up.

## 2019-11-22 ENCOUNTER — Emergency Department (HOSPITAL_COMMUNITY): Payer: No Typology Code available for payment source

## 2019-11-22 ENCOUNTER — Other Ambulatory Visit: Payer: Self-pay

## 2019-11-22 ENCOUNTER — Inpatient Hospital Stay (HOSPITAL_COMMUNITY)
Admission: EM | Admit: 2019-11-22 | Discharge: 2019-11-30 | DRG: 964 | Disposition: A | Discharge status: Skilled Nursing Facility | Payer: No Typology Code available for payment source | Attending: General Surgery | Admitting: General Surgery

## 2019-11-22 DIAGNOSIS — S42032A Displaced fracture of lateral end of left clavicle, initial encounter for closed fracture: Secondary | ICD-10-CM

## 2019-11-22 DIAGNOSIS — W108XXA Fall (on) (from) other stairs and steps, initial encounter: Secondary | ICD-10-CM

## 2019-11-22 DIAGNOSIS — S2249XA Multiple fractures of ribs, unspecified side, initial encounter for closed fracture: Secondary | ICD-10-CM

## 2019-11-22 DIAGNOSIS — W109XXA Fall (on) (from) unspecified stairs and steps, initial encounter: Secondary | ICD-10-CM | POA: Diagnosis present

## 2019-11-22 DIAGNOSIS — Z20822 Contact with and (suspected) exposure to covid-19: Secondary | ICD-10-CM | POA: Diagnosis present

## 2019-11-22 DIAGNOSIS — G40909 Epilepsy, unspecified, not intractable, without status epilepticus: Secondary | ICD-10-CM | POA: Diagnosis present

## 2019-11-22 DIAGNOSIS — F4310 Post-traumatic stress disorder, unspecified: Secondary | ICD-10-CM | POA: Diagnosis present

## 2019-11-22 DIAGNOSIS — Z23 Encounter for immunization: Secondary | ICD-10-CM | POA: Diagnosis not present

## 2019-11-22 DIAGNOSIS — S270XXA Traumatic pneumothorax, initial encounter: Secondary | ICD-10-CM | POA: Diagnosis present

## 2019-11-22 DIAGNOSIS — S22039A Unspecified fracture of third thoracic vertebra, initial encounter for closed fracture: Secondary | ICD-10-CM | POA: Diagnosis present

## 2019-11-22 DIAGNOSIS — S0240FA Zygomatic fracture, left side, initial encounter for closed fracture: Secondary | ICD-10-CM | POA: Diagnosis present

## 2019-11-22 DIAGNOSIS — E785 Hyperlipidemia, unspecified: Secondary | ICD-10-CM | POA: Diagnosis present

## 2019-11-22 DIAGNOSIS — S22029A Unspecified fracture of second thoracic vertebra, initial encounter for closed fracture: Secondary | ICD-10-CM | POA: Diagnosis present

## 2019-11-22 DIAGNOSIS — S066X0A Traumatic subarachnoid hemorrhage without loss of consciousness, initial encounter: Principal | ICD-10-CM | POA: Diagnosis present

## 2019-11-22 DIAGNOSIS — S12600A Unspecified displaced fracture of seventh cervical vertebra, initial encounter for closed fracture: Secondary | ICD-10-CM | POA: Diagnosis present

## 2019-11-22 DIAGNOSIS — S2243XS Multiple fractures of ribs, bilateral, sequela: Secondary | ICD-10-CM | POA: Diagnosis not present

## 2019-11-22 DIAGNOSIS — R0989 Other specified symptoms and signs involving the circulatory and respiratory systems: Secondary | ICD-10-CM | POA: Diagnosis present

## 2019-11-22 DIAGNOSIS — S0240DA Maxillary fracture, left side, initial encounter for closed fracture: Secondary | ICD-10-CM | POA: Diagnosis present

## 2019-11-22 DIAGNOSIS — S42035A Nondisplaced fracture of lateral end of left clavicle, initial encounter for closed fracture: Secondary | ICD-10-CM | POA: Diagnosis present

## 2019-11-22 DIAGNOSIS — S22049A Unspecified fracture of fourth thoracic vertebra, initial encounter for closed fracture: Secondary | ICD-10-CM | POA: Diagnosis present

## 2019-11-22 DIAGNOSIS — S22089A Unspecified fracture of T11-T12 vertebra, initial encounter for closed fracture: Secondary | ICD-10-CM | POA: Diagnosis present

## 2019-11-22 DIAGNOSIS — W19XXXA Unspecified fall, initial encounter: Secondary | ICD-10-CM

## 2019-11-22 DIAGNOSIS — Y92009 Unspecified place in unspecified non-institutional (private) residence as the place of occurrence of the external cause: Secondary | ICD-10-CM | POA: Diagnosis not present

## 2019-11-22 DIAGNOSIS — I609 Nontraumatic subarachnoid hemorrhage, unspecified: Secondary | ICD-10-CM

## 2019-11-22 DIAGNOSIS — S42102A Fracture of unspecified part of scapula, left shoulder, initial encounter for closed fracture: Secondary | ICD-10-CM | POA: Diagnosis present

## 2019-11-22 DIAGNOSIS — S42002A Fracture of unspecified part of left clavicle, initial encounter for closed fracture: Secondary | ICD-10-CM

## 2019-11-22 DIAGNOSIS — E860 Dehydration: Secondary | ICD-10-CM | POA: Diagnosis present

## 2019-11-22 DIAGNOSIS — S0101XA Laceration without foreign body of scalp, initial encounter: Secondary | ICD-10-CM | POA: Diagnosis present

## 2019-11-22 DIAGNOSIS — S2220XA Unspecified fracture of sternum, initial encounter for closed fracture: Secondary | ICD-10-CM | POA: Diagnosis present

## 2019-11-22 DIAGNOSIS — E871 Hypo-osmolality and hyponatremia: Secondary | ICD-10-CM

## 2019-11-22 DIAGNOSIS — I10 Essential (primary) hypertension: Secondary | ICD-10-CM | POA: Diagnosis present

## 2019-11-22 DIAGNOSIS — Z7901 Long term (current) use of anticoagulants: Secondary | ICD-10-CM

## 2019-11-22 DIAGNOSIS — T1490XA Injury, unspecified, initial encounter: Secondary | ICD-10-CM

## 2019-11-22 DIAGNOSIS — Z79899 Other long term (current) drug therapy: Secondary | ICD-10-CM

## 2019-11-22 DIAGNOSIS — S065X0A Traumatic subdural hemorrhage without loss of consciousness, initial encounter: Secondary | ICD-10-CM | POA: Diagnosis present

## 2019-11-22 DIAGNOSIS — Z888 Allergy status to other drugs, medicaments and biological substances status: Secondary | ICD-10-CM

## 2019-11-22 DIAGNOSIS — R402363 Coma scale, best motor response, obeys commands, at hospital admission: Secondary | ICD-10-CM | POA: Diagnosis present

## 2019-11-22 DIAGNOSIS — R413 Other amnesia: Secondary | ICD-10-CM | POA: Diagnosis present

## 2019-11-22 DIAGNOSIS — S0219XA Other fracture of base of skull, initial encounter for closed fracture: Secondary | ICD-10-CM

## 2019-11-22 DIAGNOSIS — S2243XA Multiple fractures of ribs, bilateral, initial encounter for closed fracture: Secondary | ICD-10-CM | POA: Diagnosis present

## 2019-11-22 DIAGNOSIS — S069X9A Unspecified intracranial injury with loss of consciousness of unspecified duration, initial encounter: Secondary | ICD-10-CM | POA: Diagnosis present

## 2019-11-22 DIAGNOSIS — S069XAA Unspecified intracranial injury with loss of consciousness status unknown, initial encounter: Secondary | ICD-10-CM | POA: Diagnosis present

## 2019-11-22 DIAGNOSIS — S2239XA Fracture of one rib, unspecified side, initial encounter for closed fracture: Secondary | ICD-10-CM

## 2019-11-22 DIAGNOSIS — R402143 Coma scale, eyes open, spontaneous, at hospital admission: Secondary | ICD-10-CM | POA: Diagnosis present

## 2019-11-22 DIAGNOSIS — S069X1S Unspecified intracranial injury with loss of consciousness of 30 minutes or less, sequela: Secondary | ICD-10-CM | POA: Diagnosis not present

## 2019-11-22 DIAGNOSIS — S22019A Unspecified fracture of first thoracic vertebra, initial encounter for closed fracture: Secondary | ICD-10-CM | POA: Diagnosis present

## 2019-11-22 DIAGNOSIS — Z86718 Personal history of other venous thrombosis and embolism: Secondary | ICD-10-CM | POA: Diagnosis not present

## 2019-11-22 DIAGNOSIS — R402253 Coma scale, best verbal response, oriented, at hospital admission: Secondary | ICD-10-CM | POA: Diagnosis present

## 2019-11-22 HISTORY — DX: Essential (primary) hypertension: I10

## 2019-11-22 HISTORY — DX: Acute embolism and thrombosis of unspecified deep veins of unspecified lower extremity: I82.409

## 2019-11-22 HISTORY — DX: Unspecified convulsions: R56.9

## 2019-11-22 LAB — LACTIC ACID, PLASMA: Lactic Acid, Venous: 1.4 mmol/L (ref 0.5–1.9)

## 2019-11-22 LAB — URINALYSIS, ROUTINE W REFLEX MICROSCOPIC
Bilirubin Urine: NEGATIVE
Glucose, UA: NEGATIVE mg/dL
Ketones, ur: NEGATIVE mg/dL
Leukocytes,Ua: NEGATIVE
Nitrite: NEGATIVE
Protein, ur: NEGATIVE mg/dL
Specific Gravity, Urine: 1.017 (ref 1.005–1.030)
pH: 5 (ref 5.0–8.0)

## 2019-11-22 LAB — CBC
HCT: 31 % — ABNORMAL LOW (ref 39.0–52.0)
Hemoglobin: 10.3 g/dL — ABNORMAL LOW (ref 13.0–17.0)
MCH: 29.6 pg (ref 26.0–34.0)
MCHC: 33.2 g/dL (ref 30.0–36.0)
MCV: 89.1 fL (ref 80.0–100.0)
Platelets: 327 10*3/uL (ref 150–400)
RBC: 3.48 MIL/uL — ABNORMAL LOW (ref 4.22–5.81)
RDW: 13 % (ref 11.5–15.5)
WBC: 19.4 10*3/uL — ABNORMAL HIGH (ref 4.0–10.5)
nRBC: 0 % (ref 0.0–0.2)

## 2019-11-22 LAB — I-STAT CHEM 8, ED
BUN: 25 mg/dL — ABNORMAL HIGH (ref 8–23)
Calcium, Ion: 1.05 mmol/L — ABNORMAL LOW (ref 1.15–1.40)
Chloride: 91 mmol/L — ABNORMAL LOW (ref 98–111)
Creatinine, Ser: 1 mg/dL (ref 0.61–1.24)
Glucose, Bld: 158 mg/dL — ABNORMAL HIGH (ref 70–99)
HCT: 31 % — ABNORMAL LOW (ref 39.0–52.0)
Hemoglobin: 10.5 g/dL — ABNORMAL LOW (ref 13.0–17.0)
Potassium: 4.7 mmol/L (ref 3.5–5.1)
Sodium: 122 mmol/L — ABNORMAL LOW (ref 135–145)
TCO2: 24 mmol/L (ref 22–32)

## 2019-11-22 LAB — COMPREHENSIVE METABOLIC PANEL
ALT: 18 U/L (ref 0–44)
AST: 30 U/L (ref 15–41)
Albumin: 3.1 g/dL — ABNORMAL LOW (ref 3.5–5.0)
Alkaline Phosphatase: 55 U/L (ref 38–126)
Anion gap: 11 (ref 5–15)
BUN: 23 mg/dL (ref 8–23)
CO2: 21 mmol/L — ABNORMAL LOW (ref 22–32)
Calcium: 8.4 mg/dL — ABNORMAL LOW (ref 8.9–10.3)
Chloride: 92 mmol/L — ABNORMAL LOW (ref 98–111)
Creatinine, Ser: 1.11 mg/dL (ref 0.61–1.24)
GFR calc Af Amer: >60 mL/min (ref >60)
GFR calc non Af Amer: >60 mL/min (ref >60)
Glucose, Bld: 165 mg/dL — ABNORMAL HIGH (ref 70–99)
Potassium: 4.8 mmol/L (ref 3.5–5.1)
Sodium: 124 mmol/L — ABNORMAL LOW (ref 135–145)
Total Bilirubin: 0.5 mg/dL (ref 0.3–1.2)
Total Protein: 6.9 g/dL (ref 6.5–8.1)

## 2019-11-22 LAB — OSMOLALITY: Osmolality: 263 mOsm/kg — ABNORMAL LOW (ref 275–295)

## 2019-11-22 LAB — SAMPLE TO BLOOD BANK

## 2019-11-22 LAB — RESPIRATORY PANEL BY RT PCR (FLU A&B, COVID)
Influenza A by PCR: NEGATIVE
Influenza B by PCR: NEGATIVE
SARS Coronavirus 2 by RT PCR: NEGATIVE

## 2019-11-22 LAB — PROTIME-INR
INR: 1.2 (ref 0.8–1.2)
Prothrombin Time: 15.1 seconds (ref 11.4–15.2)

## 2019-11-22 LAB — ETHANOL: Alcohol, Ethyl (B): <10 mg/dL (ref <10)

## 2019-11-22 MED ORDER — MORPHINE SULFATE (PF) 4 MG/ML IV SOLN: 4.0000 mg | INTRAVENOUS | Status: DC | PRN

## 2019-11-22 MED ORDER — ONDANSETRON HCL 4 MG/2ML IJ SOLN
4.0000 mg | Freq: Four times a day (QID) | INTRAMUSCULAR | Status: DC | PRN
  Administered 2019-11-23: 4 mg via INTRAVENOUS
  Filled 2019-11-22: qty 2

## 2019-11-22 MED ORDER — ACETAMINOPHEN 325 MG PO TABS
650.0000 mg | ORAL_TABLET | Freq: Four times a day (QID) | ORAL | Status: DC | PRN
  Administered 2019-11-23 (×3): 650 mg via ORAL
  Filled 2019-11-22 (×4): qty 2

## 2019-11-22 MED ORDER — SODIUM CHLORIDE 0.9 % IV BOLUS
1000.0000 mL | Freq: Once | INTRAVENOUS | Status: AC
  Administered 2019-11-22: 1000 mL via INTRAVENOUS

## 2019-11-22 MED ORDER — PROTHROMBIN COMPLEX CONC HUMAN 500 UNITS IV KIT
4646.0000 [IU] | PACK | Status: AC
  Administered 2019-11-22: 4646 [IU] via INTRAVENOUS
  Filled 2019-11-22: qty 4146

## 2019-11-22 MED ORDER — IOHEXOL 300 MG/ML  SOLN
100.0000 mL | Freq: Once | INTRAMUSCULAR | Status: AC | PRN
  Administered 2019-11-22: 100 mL via INTRAVENOUS

## 2019-11-22 MED ORDER — MORPHINE SULFATE (PF) 4 MG/ML IV SOLN
4.0000 mg | Freq: Once | INTRAVENOUS | Status: AC
  Administered 2019-11-22: 4 mg via INTRAVENOUS
  Filled 2019-11-22: qty 1

## 2019-11-22 MED ORDER — STERILE WATER FOR INJECTION IJ SOLN
INTRAMUSCULAR | Status: AC
  Administered 2019-11-22: 23:00:00 4 mL
  Filled 2019-11-22: qty 10

## 2019-11-22 MED ORDER — OXYCODONE HCL 5 MG PO TABS
10.0000 mg | ORAL_TABLET | ORAL | Status: DC | PRN
  Administered 2019-11-23 – 2019-11-28 (×9): 10 mg via ORAL
  Filled 2019-11-22 (×9): qty 2

## 2019-11-22 MED ORDER — PANTOPRAZOLE SODIUM 40 MG IV SOLR
40.0000 mg | Freq: Every day | INTRAVENOUS | Status: DC
  Administered 2019-11-22: 23:00:00 40 mg via INTRAVENOUS
  Filled 2019-11-22: qty 40

## 2019-11-22 MED ORDER — PANTOPRAZOLE SODIUM 40 MG PO TBEC
40.0000 mg | DELAYED_RELEASE_TABLET | Freq: Every day | ORAL | Status: DC
  Administered 2019-11-23 – 2019-11-30 (×8): 40 mg via ORAL
  Filled 2019-11-22 (×8): qty 1

## 2019-11-22 MED ORDER — OXYCODONE HCL 5 MG PO TABS
5.0000 mg | ORAL_TABLET | ORAL | Status: DC | PRN
  Administered 2019-11-24 – 2019-11-30 (×14): 5 mg via ORAL
  Filled 2019-11-22 (×14): qty 1

## 2019-11-22 MED ORDER — TETANUS-DIPHTH-ACELL PERTUSSIS 5-2.5-18.5 LF-MCG/0.5 IM SUSP
0.5000 mL | Freq: Once | INTRAMUSCULAR | Status: AC
  Administered 2019-11-22: 0.5 mL via INTRAMUSCULAR

## 2019-11-22 MED ORDER — ONDANSETRON 4 MG PO TBDP: 4.0000 mg | ORAL_TABLET | Freq: Four times a day (QID) | ORAL | Status: DC | PRN

## 2019-11-22 MED ORDER — POTASSIUM CHLORIDE IN NACL 20-0.9 MEQ/L-% IV SOLN
INTRAVENOUS | Status: DC
  Filled 2019-11-22 (×6): qty 1000

## 2019-11-22 NOTE — ED Triage Notes (Signed)
Pt bib ems from home after being found down at the bottom of flight of stairs. Wife heard a commotion and found patient. Pt with lac to L side of head. ? Anticoagulation. Pt does not recall the fall.,

## 2019-11-22 NOTE — Progress Notes (Signed)
Orthopedic Tech Progress Note Patient Details:  Nathan [name] 1945/08/30 [id number] LEVEL 2 TRAUMA Patient ID: Nathan [name], male   DOB: 1946-06-06, 74 y.o.   MRN: [id number]   Nathan [name] 11/22/2019, 3:10 PM

## 2019-11-22 NOTE — ED Provider Notes (Signed)
[facility] Provider Note   CSN: [id number] Arrival date & time: 11/22/19  1503     History Chief Complaint  Patient presents with  . Fall    Nathan [name] is a 74 y.o. male.  74 yo M with a chief complaint of a fall.  Patient is not sure exactly what happened.  He thinks he was going up the steps and then woke up on the ground.  Complaining of pain to the left shoulder.  Denies other areas of pain.  Denies head injury denies loss consciousness denies neck pain back pain chest pain abdominal pain.  He is on a blood thinner but is not sure why.  The history is provided by the patient and the EMS personnel.  Fall This is a new problem. The current episode started 1 to 2 hours ago. The problem occurs rarely. The problem has been resolved. Pertinent negatives include no chest pain, no abdominal pain, no headaches and no shortness of breath. Nothing aggravates the symptoms. Nothing relieves the symptoms. He has tried nothing for the symptoms. The treatment provided no relief.       History reviewed. No pertinent past medical history.  Patient Active Problem List   Diagnosis Date Noted  . TBI (traumatic brain injury) ([place]) 11/22/2019    History reviewed. No pertinent surgical history.     No family history on file.  Social History   Tobacco Use  . Smoking status: Not on file  Substance Use Topics  . Alcohol use: Not on file  . Drug use: Not on file    Home Medications Prior to Admission medications   Medication Sig Start Date End Date Taking? Authorizing Provider  Apixaban (ELIQUIS PO) Take 10 mg by mouth in the morning and at bedtime.   Yes [provider]  ARIPiprazole (ABILIFY) 15 MG tablet Take 7.5 mg by mouth daily.   Yes [provider]  fluticasone (FLONASE) 50 MCG/ACT nasal spray Place 2 sprays into both nostrils daily.   Yes [provider]  lisinopril (ZESTRIL) 10 MG tablet Take 10 mg by mouth  daily.   Yes [provider]  metoprolol tartrate (LOPRESSOR) 50 MG tablet Take 50 mg by mouth 2 (two) times daily.   Yes [provider]  Multiple Vitamin (MULTIVITAMIN WITH MINERALS) TABS tablet Take 1 tablet by mouth daily.   Yes [provider]  Omega-3 Fatty Acids (OMEGA 3 500 PO) Take 1,000 mg by mouth daily.   Yes [provider]  omeprazole (PRILOSEC) 10 MG capsule Take 10 mg by mouth daily.   Yes [provider]  simvastatin (ZOCOR) 5 MG tablet Take 2.5 mg by mouth daily.   Yes [provider]  venlafaxine (EFFEXOR) 100 MG tablet Take 300 mg by mouth daily.   Yes [provider]    Allergies    Terazosin  Review of Systems   Review of Systems  Respiratory: Negative for shortness of breath.   Cardiovascular: Negative for chest pain.  Gastrointestinal: Negative for abdominal pain.  Neurological: Negative for headaches.    Physical Exam Updated Vital Signs BP (!) 141/75   Pulse 78   Temp 97.8 F (36.6 C) (Oral)   Resp (!) 21   Ht 5\' 11"  (1.803 m)   Wt 98.4 kg   SpO2 94%   BMI 30.27 kg/m   Physical Exam Vitals and nursing note reviewed.  Constitutional:      Appearance: He is well-developed.  HENT:     Head: Normocephalic.     Comments: Approximately 10 cm laceration to the left temporal area.  Active venous bleeding. Eyes:     Pupils: Pupils are equal, round, and reactive to light.  Neck:     Vascular: No JVD.  Cardiovascular:     Rate and Rhythm: Normal rate and regular rhythm.     Heart sounds: No murmur. No friction rub. No gallop.   Pulmonary:     Effort: No respiratory distress.     Breath sounds: No wheezing.  Abdominal:     General: There is no distension.     Tenderness: There is no guarding or rebound.  Musculoskeletal:        General: Tenderness present. Normal range of motion.     Cervical back: Normal range of motion and neck supple.     Comments: Tenderness with palpation of the  left clavicle and the left scapula.  Pain with range of motion of the left upper extremity.  No proximal humerus tenderness.  Full range of motion of the elbow and the wrist.  Pulse motor and sensation are intact distally.  Skin:    Coloration: Skin is not pale.     Findings: No rash.  Neurological:     Mental Status: He is alert and oriented to person, place, and time.  Psychiatric:        Behavior: Behavior normal.     ED Results / Procedures / Treatments   Labs (all labs ordered are listed, but only abnormal results are displayed) Labs Reviewed  COMPREHENSIVE METABOLIC PANEL - Abnormal; Notable for the following components:      Result Value   Sodium 124 (*)    Chloride 92 (*)    CO2 21 (*)    Glucose, Bld 165 (*)    Calcium 8.4 (*)    Albumin 3.1 (*)    All other components within normal limits  CBC - Abnormal; Notable for the following components:   WBC 19.4 (*)    RBC 3.48 (*)    Hemoglobin 10.3 (*)    HCT 31.0 (*)    All other components within normal limits  URINALYSIS, ROUTINE W REFLEX MICROSCOPIC - Abnormal; Notable for the following components:   APPearance HAZY (*)    Hgb urine dipstick LARGE (*)    Bacteria, UA RARE (*)    All other components within normal limits  OSMOLALITY - Abnormal; Notable for the following components:   Osmolality 263 (*)    All other components within normal limits  CBC - Abnormal; Notable for the following components:   RBC 3.63 (*)    Hemoglobin 11.1 (*)    HCT 33.5 (*)    All other components within normal limits  BASIC METABOLIC PANEL - Abnormal; Notable for the following components:   Sodium 125 (*)    Chloride 95 (*)    CO2 18 (*)    Glucose, Bld 129 (*)    Calcium 8.3 (*)    All other components within normal limits  I-STAT CHEM 8, ED - Abnormal; Notable for the following components:   Sodium 122 (*)    Chloride 91 (*)    BUN 25 (*)    Glucose, Bld 158 (*)    Calcium, Ion 1.05 (*)    Hemoglobin 10.5 (*)    HCT 31.0  (*)    All other components within normal limits  RESPIRATORY PANEL BY RT PCR (FLU A&B, COVID)  MRSA  PCR SCREENING  ETHANOL  LACTIC ACID, PLASMA  PROTIME-INR  PROTIME-INR  OSMOLALITY, URINE  SAMPLE TO BLOOD BANK    EKG None  Radiology CT HEAD WO CONTRAST  Result Date: 11/23/2019 CLINICAL DATA:  Facial trauma follow-up EXAM: CT HEAD WITHOUT CONTRAST CT MAXILLOFACIAL WITHOUT CONTRAST TECHNIQUE: Multidetector CT imaging of the head and maxillofacial structures were performed using the standard protocol without intravenous contrast. Multiplanar CT image reconstructions of the maxillofacial structures were also generated. COMPARISON:  Head CT from yesterday FINDINGS: CT HEAD FINDINGS Brain: Right inferior temporal hemorrhagic contusion with no progression. The largest pocket of blood is 5 mm. There could be overlying subarachnoid blood which is also not progressed. Minimal subdural hemorrhage seen along the right tentorium. No left-sided hemorrhage seen today. No infarct or hydrocephalus. Chronic small vessel ischemia in the deep cerebral white matter Vascular: No hyperdense vessel or unexpected calcification. Skull: Limited, nondisplaced fracture through the left calvarium. Left scalp hematoma with progressive swelling. CT MAXILLOFACIAL FINDINGS Osseous: Nondepressed left zygomatic arch fracture. There is mild deformity of the posterior left maxillary sinus wall without definite acute fracture at this level. Certainly no tripod complex fracture. Mandible is intact and located Orbits: No evidence of injury Sinuses: High-density in the left sphenoid sinus could reflect hemosinus (or incidental inflammatory inspissated material) but no visible underlying fracture. Soft tissues: Left scalp swelling and gas IMPRESSION: 1. Unchanged right inferior temporal hemorrhagic contusion. 2. Trace subdural hemorrhage is now seen along the right tentorium. 3. Left calvarial and zygomatic arch fractures, nondisplaced.  Electronically Signed   By: Monte Fantasia M.D.   On: 11/23/2019 05:39   CT HEAD WO CONTRAST  Result Date: 11/22/2019 CLINICAL DATA:  Head trauma, intracranial arterial injury suspected. Poly trauma, critical, head/cervical spine injury suspected. Additional history provided: Patient found down at the bottom of flight of stairs laceration to side of head. EXAM: CT HEAD WITHOUT CONTRAST CT CERVICAL SPINE WITHOUT CONTRAST TECHNIQUE: Multidetector CT imaging of the head and cervical spine was performed following the standard protocol without intravenous contrast. Multiplanar CT image reconstructions of the cervical spine were also generated. COMPARISON:  Head CT 09/30/2017. FINDINGS: CT HEAD FINDING: Brain: There is a hemorrhagic contusion within the inferolateral aspect of the right temporal lobe with overlying small volume acute subarachnoid hemorrhage (series 1, image 11) (series 5, images 39-48). Small volume acute subarachnoid hemorrhage is also present along the undersurface of the anterior left temporal lobe (series 5, image 29) (series 6, image 47). Moderate ill-defined hypoattenuation within the cerebral white matter is nonspecific, but consistent with chronic small vessel ischemic disease. Mild generalized parenchymal atrophy. Findings are stable as compared to prior head CT 09/30/2017. No demarcated cortical infarct. No evidence of intracranial mass. No midline shift. Vascular: No hyperdense vessel.  Atherosclerotic calcifications. Skull: There is a nondisplaced fracture of the squamous left temporal bone (for instance as seen on series 4, images 41 and 36). There is an additional nondisplaced fracture of the left sphenoid bone (for instance as seen on series 4, images 20-24). Nondisplaced fracture of the left zygomatic arch (series 4, image 19). Additionally, there is a suspected minimally displaced fracture of the posterior wall the left maxillary sinus (series 4, image 17). Sinuses/Orbits: Visualized  orbits show no acute finding. Air-fluid level within the left maxillary sinus. No significant mastoid effusion. Other: Sizable left-sided scalp hematoma. Laceration at this site with associated scalp staples and subcutaneous gas. CT CERVICAL SPINE FINDINGS Alignment: Straightening of the expected cervical lordosis. No significant spondylolisthesis. Skull  base and vertebrae: The basion-dental and atlanto-dental intervals are maintained.There is an acute, mildly displaced fracture of the left C7 transverse process (series 5, image 70). No other acute fracture to the cervical spine is identified. Soft tissues and spinal canal: No prevertebral fluid or swelling. No visible canal hematoma. Disc levels: Cervical spondylosis with multilevel disc space narrowing, posterior disc osteophytes, uncovertebral and facet hypertrophy. There is degenerative fusion across the C5-C6 disc space. Upper chest: No consolidation within the imaged lung apices. Trace left apical pneumothorax. Other: There is an acute displaced fracture of the left scapula (series 8, image 67). Acute, nondisplaced fracture of the posterior left first rib. Acute, displaced fracture of the lateral left first rib (series 8, image 70). Acute, mildly displaced fractures of the left T1, T2 and T3 transverse processes. These results were called by telephone at the time of interpretation on 11/22/2019 at 4:38 pm to provider Dr. Darl Householder, who verbally acknowledged these results. IMPRESSION: CT head: 1. Hemorrhagic contusion within the inferolateral right temporal lobe with overlying small volume acute subarachnoid hemorrhage. 2. Additional small volume acute subarachnoid hemorrhage along the undersurface of the anterior left temporal lobe. 3. Nondisplaced fractures of the squamous left temporal bone and of the left sphenoid bone. Additional nondisplaced fracture of the left zygomatic arch. A minimally displaced fracture of the posterior wall the left maxillary sinus is also  suspected. Maxillofacial CT is recommended for further evaluation. 4. Sizable left scalp hematoma. Associated laceration at this site with scalp staples and subcutaneous gas. 5. Moderate chronic small vessel ischemic disease. 6. Mild generalized parenchymal atrophy. 7. Left sphenoid sinus air-fluid level, possibly reflecting blood. CT cervical spine: 1. Mildly displaced fracture of the left C7 transverse process. 2. Displaced fracture of the left scapula. 3. Displaced fracture of the posterior and lateral left first rib. 4. Mildly displaced fractures of the left T1, T2 and T3 transverse processes. 5. Trace left apical pneumothorax. 6. CT of the thorax is recommended for further evaluation. 7. Cervical spondylosis as described. Degenerative fusion across the C5-C6 disc space. Electronically Signed   By: Kellie Simmering DO   On: 11/22/2019 16:39   CT Chest W Contrast  Result Date: 11/22/2019 CLINICAL DATA:  Trauma. Found bottom of flight of stairs. EXAM: CT CHEST, ABDOMEN, AND PELVIS WITH CONTRAST TECHNIQUE: Multidetector CT imaging of the chest, abdomen and pelvis was performed following the standard protocol during bolus administration of intravenous contrast. CONTRAST:  146mL OMNIPAQUE IOHEXOL 300 MG/ML  SOLN COMPARISON:  None. FINDINGS: CT CHEST FINDINGS Cardiovascular: Aortic atherosclerosis without evidence of acute injury. Heart is normal in size. No pericardial effusion. There are coronary artery calcifications. Mediastinum/Nodes: No mediastinal hemorrhage or hematoma. No pneumomediastinum. No adenopathy. Decompressed esophagus with tiny hiatal hernia. Lungs/Pleura: No pneumothorax. No pulmonary contusion. There is left pleural thickening without significant effusion. Subpleural reticulation in the periphery of the lungs, basilar predominant. Anterior left upper lobe, subpleural reticulation is likely posttraumatic related to rib fractures. Trachea and mainstem bronchi are patent. Musculoskeletal: Displaced  left scapular fracture involving the body and base of the acromium, best appreciated on reformatted views. There is associated soft tissue edema/hemorrhage. No definite extension to the glenohumeral joint. Fracture of the left anterior first rib which is comminuted. Fracture of the left anterior second rib at the costochondral junction. Nondisplaced left anterior third and fourth rib fractures. Heterogeneous stranding involving the intercostal musculature related to rib fractures. There is a nondisplaced fracture through the sternal manubrium. There are fractures of the posteromedial right eleventh  and twelfth ribs at the costovertebral junction. Thoracic spine is assessed on dedicated reformats, reported separately. CT ABDOMEN PELVIS FINDINGS Hepatobiliary: Subcapsular low-density in the periphery of the right lobe of the liver follows the course the diaphragm and is likely rated to the diaphragmatic crus. There is no perihepatic hematoma or free fluid. Small low-density lesions in the liver are too small to characterize but likely cysts. Gallbladder is unremarkable. Pancreas: Fatty atrophy of the pancreas without evidence of injury. No ductal dilatation or inflammation. Spleen: No splenic injury or perisplenic hematoma. Adrenals/Urinary Tract: No adrenal hemorrhage or renal injury identified. Tiny subcapsular hypodensities in the right kidney are too small to characterize but likely cysts. Bladder is unremarkable. Stomach/Bowel: Small hiatal hernia. Ingested material within the stomach without evidence of gastric injury. There is no evidence of bowel or mesenteric injury. No bowel wall thickening or inflammation. No mesenteric hematoma. No free air. Moderate volume of colonic stool. Minor distal colonic diverticulosis. Vascular/Lymphatic: No aortic injury. No retroperitoneal fluid. Aortic atherosclerosis. IVC appears intact. Few prominent bilateral external iliac nodes are nonspecific. Reproductive: Prostate is  unremarkable. Other: No free air. No free fluid. No confluent body wall contusion. Musculoskeletal: Lumbar spine is better assessed on dedicated lumbar reformats. There is no acute pelvic fracture. IMPRESSION: 1. Displaced left scapular fracture involving the body and base of the acromium. No definite extension to the glenohumeral joint. 2. Left anterior first through fourth rib fractures, second rib fracture at the costochondral junction. No pneumothorax. 3. Nondisplaced sternal fracture. 4. Fractures of the posteromedial right eleventh and twelfth ribs at the costovertebral junction. 5. No additional acute traumatic injury to the chest, abdomen, or pelvis. 6. Thoracic and lumbar spine assessed on separately reported spine reformats. Aortic Atherosclerosis (ICD10-I70.0). Electronically Signed   By: Keith Rake M.D.   On: 11/22/2019 18:06   CT CERVICAL SPINE WO CONTRAST  Result Date: 11/22/2019 CLINICAL DATA:  Head trauma, intracranial arterial injury suspected. Poly trauma, critical, head/cervical spine injury suspected. Additional history provided: Patient found down at the bottom of flight of stairs laceration to side of head. EXAM: CT HEAD WITHOUT CONTRAST CT CERVICAL SPINE WITHOUT CONTRAST TECHNIQUE: Multidetector CT imaging of the head and cervical spine was performed following the standard protocol without intravenous contrast. Multiplanar CT image reconstructions of the cervical spine were also generated. COMPARISON:  Head CT 09/30/2017. FINDINGS: CT HEAD FINDING: Brain: There is a hemorrhagic contusion within the inferolateral aspect of the right temporal lobe with overlying small volume acute subarachnoid hemorrhage (series 1, image 11) (series 5, images 39-48). Small volume acute subarachnoid hemorrhage is also present along the undersurface of the anterior left temporal lobe (series 5, image 29) (series 6, image 47). Moderate ill-defined hypoattenuation within the cerebral white matter is  nonspecific, but consistent with chronic small vessel ischemic disease. Mild generalized parenchymal atrophy. Findings are stable as compared to prior head CT 09/30/2017. No demarcated cortical infarct. No evidence of intracranial mass. No midline shift. Vascular: No hyperdense vessel.  Atherosclerotic calcifications. Skull: There is a nondisplaced fracture of the squamous left temporal bone (for instance as seen on series 4, images 41 and 36). There is an additional nondisplaced fracture of the left sphenoid bone (for instance as seen on series 4, images 20-24). Nondisplaced fracture of the left zygomatic arch (series 4, image 19). Additionally, there is a suspected minimally displaced fracture of the posterior wall the left maxillary sinus (series 4, image 17). Sinuses/Orbits: Visualized orbits show no acute finding. Air-fluid level within the left  maxillary sinus. No significant mastoid effusion. Other: Sizable left-sided scalp hematoma. Laceration at this site with associated scalp staples and subcutaneous gas. CT CERVICAL SPINE FINDINGS Alignment: Straightening of the expected cervical lordosis. No significant spondylolisthesis. Skull base and vertebrae: The basion-dental and atlanto-dental intervals are maintained.There is an acute, mildly displaced fracture of the left C7 transverse process (series 5, image 70). No other acute fracture to the cervical spine is identified. Soft tissues and spinal canal: No prevertebral fluid or swelling. No visible canal hematoma. Disc levels: Cervical spondylosis with multilevel disc space narrowing, posterior disc osteophytes, uncovertebral and facet hypertrophy. There is degenerative fusion across the C5-C6 disc space. Upper chest: No consolidation within the imaged lung apices. Trace left apical pneumothorax. Other: There is an acute displaced fracture of the left scapula (series 8, image 67). Acute, nondisplaced fracture of the posterior left first rib. Acute, displaced  fracture of the lateral left first rib (series 8, image 70). Acute, mildly displaced fractures of the left T1, T2 and T3 transverse processes. These results were called by telephone at the time of interpretation on 11/22/2019 at 4:38 pm to provider Dr. Darl Householder, who verbally acknowledged these results. IMPRESSION: CT head: 1. Hemorrhagic contusion within the inferolateral right temporal lobe with overlying small volume acute subarachnoid hemorrhage. 2. Additional small volume acute subarachnoid hemorrhage along the undersurface of the anterior left temporal lobe. 3. Nondisplaced fractures of the squamous left temporal bone and of the left sphenoid bone. Additional nondisplaced fracture of the left zygomatic arch. A minimally displaced fracture of the posterior wall the left maxillary sinus is also suspected. Maxillofacial CT is recommended for further evaluation. 4. Sizable left scalp hematoma. Associated laceration at this site with scalp staples and subcutaneous gas. 5. Moderate chronic small vessel ischemic disease. 6. Mild generalized parenchymal atrophy. 7. Left sphenoid sinus air-fluid level, possibly reflecting blood. CT cervical spine: 1. Mildly displaced fracture of the left C7 transverse process. 2. Displaced fracture of the left scapula. 3. Displaced fracture of the posterior and lateral left first rib. 4. Mildly displaced fractures of the left T1, T2 and T3 transverse processes. 5. Trace left apical pneumothorax. 6. CT of the thorax is recommended for further evaluation. 7. Cervical spondylosis as described. Degenerative fusion across the C5-C6 disc space. Electronically Signed   By: Kellie Simmering DO   On: 11/22/2019 16:39   CT ABDOMEN PELVIS W CONTRAST  Result Date: 11/22/2019 CLINICAL DATA:  Trauma. Found bottom of flight of stairs. EXAM: CT CHEST, ABDOMEN, AND PELVIS WITH CONTRAST TECHNIQUE: Multidetector CT imaging of the chest, abdomen and pelvis was performed following the standard protocol during  bolus administration of intravenous contrast. CONTRAST:  123mL OMNIPAQUE IOHEXOL 300 MG/ML  SOLN COMPARISON:  None. FINDINGS: CT CHEST FINDINGS Cardiovascular: Aortic atherosclerosis without evidence of acute injury. Heart is normal in size. No pericardial effusion. There are coronary artery calcifications. Mediastinum/Nodes: No mediastinal hemorrhage or hematoma. No pneumomediastinum. No adenopathy. Decompressed esophagus with tiny hiatal hernia. Lungs/Pleura: No pneumothorax. No pulmonary contusion. There is left pleural thickening without significant effusion. Subpleural reticulation in the periphery of the lungs, basilar predominant. Anterior left upper lobe, subpleural reticulation is likely posttraumatic related to rib fractures. Trachea and mainstem bronchi are patent. Musculoskeletal: Displaced left scapular fracture involving the body and base of the acromium, best appreciated on reformatted views. There is associated soft tissue edema/hemorrhage. No definite extension to the glenohumeral joint. Fracture of the left anterior first rib which is comminuted. Fracture of the left anterior second rib at  the costochondral junction. Nondisplaced left anterior third and fourth rib fractures. Heterogeneous stranding involving the intercostal musculature related to rib fractures. There is a nondisplaced fracture through the sternal manubrium. There are fractures of the posteromedial right eleventh and twelfth ribs at the costovertebral junction. Thoracic spine is assessed on dedicated reformats, reported separately. CT ABDOMEN PELVIS FINDINGS Hepatobiliary: Subcapsular low-density in the periphery of the right lobe of the liver follows the course the diaphragm and is likely rated to the diaphragmatic crus. There is no perihepatic hematoma or free fluid. Small low-density lesions in the liver are too small to characterize but likely cysts. Gallbladder is unremarkable. Pancreas: Fatty atrophy of the pancreas without  evidence of injury. No ductal dilatation or inflammation. Spleen: No splenic injury or perisplenic hematoma. Adrenals/Urinary Tract: No adrenal hemorrhage or renal injury identified. Tiny subcapsular hypodensities in the right kidney are too small to characterize but likely cysts. Bladder is unremarkable. Stomach/Bowel: Small hiatal hernia. Ingested material within the stomach without evidence of gastric injury. There is no evidence of bowel or mesenteric injury. No bowel wall thickening or inflammation. No mesenteric hematoma. No free air. Moderate volume of colonic stool. Minor distal colonic diverticulosis. Vascular/Lymphatic: No aortic injury. No retroperitoneal fluid. Aortic atherosclerosis. IVC appears intact. Few prominent bilateral external iliac nodes are nonspecific. Reproductive: Prostate is unremarkable. Other: No free air. No free fluid. No confluent body wall contusion. Musculoskeletal: Lumbar spine is better assessed on dedicated lumbar reformats. There is no acute pelvic fracture. IMPRESSION: 1. Displaced left scapular fracture involving the body and base of the acromium. No definite extension to the glenohumeral joint. 2. Left anterior first through fourth rib fractures, second rib fracture at the costochondral junction. No pneumothorax. 3. Nondisplaced sternal fracture. 4. Fractures of the posteromedial right eleventh and twelfth ribs at the costovertebral junction. 5. No additional acute traumatic injury to the chest, abdomen, or pelvis. 6. Thoracic and lumbar spine assessed on separately reported spine reformats. Aortic Atherosclerosis (ICD10-I70.0). Electronically Signed   By: Keith Rake M.D.   On: 11/22/2019 18:06   CT T-SPINE NO CHARGE  Result Date: 11/22/2019 CLINICAL DATA:  Trauma, found bottom of flight of stairs. EXAM: CT THORACIC SPINE WITHOUT CONTRAST TECHNIQUE: Multidetector CT images of the thoracic were obtained using the standard protocol without intravenous contrast.  COMPARISON:  Chest radiograph 09/30/2017. No dedicated thoracic spine imaging. Lumbar radiographs 12/01/2017 FINDINGS: Alignment: Normal. Vertebrae: Mild T12 superior endplate compression fracture, new/progressed from 2019 radiograph. Prominent Schmorl's node superior endplate of T6 with minimal adjacent loss of height. There are left transverse process fractures of T1, T2, T3, T4, and T5. Paraspinal and other soft tissues: Left paraspinal soft tissue edema. Disc levels: Osseous projection in the dorsal canal the level of T3-T4 causes mild mass effect, appears to represent a spur rather than fracture fragment. Multilevel disc space narrowing and anterior spurring. No other areas of spinal canal narrowing. IMPRESSION: 1. Mild T12 superior endplate compression fracture, new/progressed from 2019 lumbar radiograph. 2. Acute left transverse process fractures of T1, T2, T3, T4, and T5. 3. Prominent Schmorl's node superior endplate of T6 with slight loss of height, likely chronic but technically age indeterminate. Electronically Signed   By: Keith Rake M.D.   On: 11/22/2019 18:18   CT L-SPINE NO CHARGE  Result Date: 11/22/2019 CLINICAL DATA:  Trauma, found at bottom of stairs. EXAM: CT LUMBAR SPINE WITHOUT CONTRAST TECHNIQUE: Multidetector CT imaging of the lumbar spine was performed without intravenous contrast administration. Multiplanar CT image reconstructions were also  generated. COMPARISON:  Lumbar spine radiographs 12/01/2017 FINDINGS: Segmentation: 5 lumbar type vertebrae. Alignment: Degenerative retrolisthesis of L3 on L4 and L1 on L2. Vertebrae: Mild superior endplate compression fracture of T12, new/progressed from 2019. The remaining lumbar vertebral body heights are preserved. There is no involvement or extension to the posterior elements. Suspected nondisplaced fracture of right L1 transverse process. Advanced degenerative disc disease and facet hypertrophy with prominent Modic endplate changes at  [id number] and L4-L5. Paraspinal and other soft tissues: No definite edema related to T12 fracture. Paraspinal musculature is unremarkable. Disc levels: Advanced multilevel degenerative disc disease and prominent facet hypertrophy. Near complete disc space loss at L4-L5 and L5-S1. Multilevel vacuum phenomenon. There is bony canal and neural foraminal stenosis at multiple levels. IMPRESSION: 1. Mild superior endplate compression fracture of T12, new/progressed from 2019, technically age indeterminate but may be acute. 2. Suspected acute nondisplaced fracture of right L1 transverse process. 3. Advanced multilevel degenerative disc disease and facet hypertrophy throughout the lumbar spine. Electronically Signed   By: Keith Rake M.D.   On: 11/22/2019 18:11   DG Chest Port 1 View  Result Date: 11/22/2019 CLINICAL DATA:  Fall EXAM: PORTABLE CHEST 1 VIEW COMPARISON:  09/30/2017 FINDINGS: The heart size and mediastinal contours are within normal limits. Atherosclerotic calcification of the aortic knob. No focal airspace consolidation, pleural effusion, or pneumothorax. The visualized skeletal structures are unremarkable. IMPRESSION: No acute cardiopulmonary findings. Electronically Signed   By: Davina Poke D.O.   On: 11/22/2019 15:40   DG Shoulder Left Portable  Result Date: 11/22/2019 CLINICAL DATA:  Pain following fall EXAM: LEFT SHOULDER COMPARISON:  None. FINDINGS: Oblique and Y scapular images were obtained. There is a comminuted fracture of the lateral aspect of the clavicle with mild displacement of fracture fragments. No other fracture evident. There is a small chronic appearing avulsion along the inferior glenoid as well as cortical irregularity along the lateral humeral head, findings likely indicative of previous anterior shoulder dislocation. There is mild narrowing of the glenohumeral joint. Acromioclavicular joint appears unremarkable. IMPRESSION: 1.  Acute appearing comminuted fracture lateral  left clavicle. 2. 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 3.  Narrowing glenohumeral joint. Electronically Signed   By: Lowella Grip III M.D.   On: 11/22/2019 15:46   CT MAXILLOFACIAL WO CONTRAST  Result Date: 11/23/2019 CLINICAL DATA:  Facial trauma follow-up EXAM: CT HEAD WITHOUT CONTRAST CT MAXILLOFACIAL WITHOUT CONTRAST TECHNIQUE: Multidetector CT imaging of the head and maxillofacial structures were performed using the standard protocol without intravenous contrast. Multiplanar CT image reconstructions of the maxillofacial structures were also generated. COMPARISON:  Head CT from yesterday FINDINGS: CT HEAD FINDINGS Brain: Right inferior temporal hemorrhagic contusion with no progression. The largest pocket of blood is 5 mm. There could be overlying subarachnoid blood which is also not progressed. Minimal subdural hemorrhage seen along the right tentorium. No left-sided hemorrhage seen today. No infarct or hydrocephalus. Chronic small vessel ischemia in the deep cerebral white matter Vascular: No hyperdense vessel or unexpected calcification. Skull: Limited, nondisplaced fracture through the left calvarium. Left scalp hematoma with progressive swelling. CT MAXILLOFACIAL FINDINGS Osseous: Nondepressed left zygomatic arch fracture. There is mild deformity of the posterior left maxillary sinus wall without definite acute fracture at this level. Certainly no tripod complex fracture. Mandible is intact and located Orbits: No evidence of injury Sinuses: High-density in the left sphenoid sinus  could reflect hemosinus (or incidental inflammatory inspissated material) but no visible underlying fracture. Soft tissues: Left scalp swelling and gas IMPRESSION: 1. Unchanged right inferior temporal hemorrhagic contusion. 2. Trace subdural  hemorrhage is now seen along the right tentorium. 3. Left calvarial and zygomatic arch fractures, nondisplaced. Electronically Signed   By: Monte Fantasia M.D.   On: 11/23/2019 05:39    Procedures .Marland KitchenLaceration Repair  Date/Time: 11/22/2019 3:52 PM Performed by: Deno Etienne, DO Authorized by: Deno Etienne, DO   Consent:    Consent obtained:  Verbal   Consent given by:  Patient   Risks discussed:  Infection, pain, poor cosmetic result and poor wound healing   Alternatives discussed:  No treatment, delayed treatment and observation Anesthesia (see MAR for exact dosages):    Anesthesia method:  Local infiltration   Local anesthetic:  Lidocaine 2% WITH epi Laceration details:    Location:  Scalp   Scalp location:  L temporal   Length (cm):  10 Repair type:    Repair type:  Simple Pre-procedure details:    Preparation:  Patient was prepped and draped in usual sterile fashion Exploration:    Hemostasis achieved with:  Epinephrine and direct pressure   Wound exploration: entire depth of wound probed and visualized     Wound extent: vascular damage     Contaminated: no   Treatment:    Area cleansed with:  Saline   Amount of cleaning:  Extensive   Irrigation solution:  Sterile saline   Irrigation volume:  100   Irrigation method:  Syringe   Visualized foreign bodies/material removed: no   Skin repair:    Repair method:  Staples   Number of staples:  7 Approximation:    Approximation:  Close Post-procedure details:    Dressing:  Open (no dressing)   Patient tolerance of procedure:  Tolerated well, no immediate complications   (including critical care time)  Medications Ordered in ED Medications  0.9 % NaCl with KCl 20 mEq/ L  infusion ( Intravenous Rate/Dose Verify 11/23/19 0700)  oxyCODONE (Oxy IR/ROXICODONE) immediate release tablet 5 mg (has no administration in time range)  oxyCODONE (Oxy IR/ROXICODONE) immediate release tablet 10 mg (has no administration in time range)   morphine 4 MG/ML injection 4 mg (has no administration in time range)  ondansetron (ZOFRAN-ODT) disintegrating tablet 4 mg (has no administration in time range)    Or  ondansetron (ZOFRAN) injection 4 mg (has no administration in time range)  pantoprazole (PROTONIX) EC tablet 40 mg ( Oral See Alternative 11/22/19 2234)    Or  pantoprazole (PROTONIX) injection 40 mg (40 mg Intravenous Given 11/22/19 2234)  acetaminophen (TYLENOL) tablet 650 mg (650 mg Oral Given 11/23/19 0617)  metoprolol tartrate (LOPRESSOR) injection 5 mg (5 mg Intravenous Not Given 11/23/19 0617)  Chlorhexidine Gluconate Cloth 2 % PADS 6 each (has no administration in time range)  Tdap (BOOSTRIX) injection 0.5 mL (0.5 mLs Intramuscular Given 11/22/19 1520)  prothrombin complex conc human (KCENTRA) IVPB 4,646 Units (0 Units Intravenous Stopped 11/22/19 1831)  sodium chloride 0.9 % bolus 1,000 mL (0 mLs Intravenous Stopped 11/22/19 1957)  iohexol (OMNIPAQUE) 300 MG/ML solution 100 mL (100 mLs Intravenous Contrast Given 11/22/19 1747)  morphine 4 MG/ML injection 4 mg (4 mg Intravenous Given 11/22/19 2025)  sterile water (preservative free) injection (4 mLs  Given 11/22/19 2234)  metoprolol tartrate (LOPRESSOR) 5 MG/5ML injection (  Duplicate [id number])    ED Course  I have reviewed the triage vital signs  and the nursing notes.  Pertinent labs & imaging results that were available during my care of the patient were reviewed by me and considered in my medical decision making (see chart for details).    MDM Rules/Calculators/A&P                      74 yo M with a chief complaints of fall.  Patient fell while going up the stairs reportedly.  His wife had heard a bump and she found him at the bottom of the stairs.  He is complaining of left shoulder pain.  Found to have a left clavicle fracture.  Will CT the head and C-spine.  Wound repaired at bedside.  Tetanus updated.  Signed out to Dr. Darl Householder, please see his note for further  details of care in the ED.  The patients results and plan were reviewed and discussed.   Any x-rays performed were independently reviewed by myself.   Differential diagnosis were considered with the presenting HPI.  Medications  0.9 % NaCl with KCl 20 mEq/ L  infusion ( Intravenous Rate/Dose Verify 11/23/19 0700)  oxyCODONE (Oxy IR/ROXICODONE) immediate release tablet 5 mg (has no administration in time range)  oxyCODONE (Oxy IR/ROXICODONE) immediate release tablet 10 mg (has no administration in time range)  morphine 4 MG/ML injection 4 mg (has no administration in time range)  ondansetron (ZOFRAN-ODT) disintegrating tablet 4 mg (has no administration in time range)    Or  ondansetron (ZOFRAN) injection 4 mg (has no administration in time range)  pantoprazole (PROTONIX) EC tablet 40 mg ( Oral See Alternative 11/22/19 2234)    Or  pantoprazole (PROTONIX) injection 40 mg (40 mg Intravenous Given 11/22/19 2234)  acetaminophen (TYLENOL) tablet 650 mg (650 mg Oral Given 11/23/19 0617)  metoprolol tartrate (LOPRESSOR) injection 5 mg (5 mg Intravenous Not Given 11/23/19 0617)  Chlorhexidine Gluconate Cloth 2 % PADS 6 each (has no administration in time range)  Tdap (BOOSTRIX) injection 0.5 mL (0.5 mLs Intramuscular Given 11/22/19 1520)  prothrombin complex conc human (KCENTRA) IVPB 4,646 Units (0 Units Intravenous Stopped 11/22/19 1831)  sodium chloride 0.9 % bolus 1,000 mL (0 mLs Intravenous Stopped 11/22/19 1957)  iohexol (OMNIPAQUE) 300 MG/ML solution 100 mL (100 mLs Intravenous Contrast Given 11/22/19 1747)  morphine 4 MG/ML injection 4 mg (4 mg Intravenous Given 11/22/19 2025)  sterile water (preservative free) injection (4 mLs  Given 11/22/19 2234)  metoprolol tartrate (LOPRESSOR) 5 MG/5ML injection (  Duplicate [id number])    Vitals:   11/23/19 0500 11/23/19 0530 11/23/19 0600 11/23/19 0700  BP: (!) 143/82 (!) 192/94 (!) 162/99 (!) 141/75  Pulse: 93 94 97 78  Resp: 20 20 13  (!) 21  Temp:    97.8 F (36.6 C)   TempSrc:   Oral   SpO2: 96% 98% 96% 94%  Weight:      Height:   5\' 11"  (1.803 m)     Final diagnoses:  Trauma  Fall  Subarachnoid bleed ([place])  Closed fracture of temporal bone, initial encounter ([place])  Closed nondisplaced fracture of left clavicle, unspecified part of clavicle, initial encounter  Closed fracture of multiple ribs of both sides, initial encounter  Hyponatremia  Closed fracture of left scapula, unspecified part of scapula, initial encounter  Closed fracture of sternum, unspecified portion of sternum, initial encounter  Rib fractures     Final Clinical Impression(s) / ED Diagnoses Final diagnoses:  Trauma  Fall  Subarachnoid bleed ([facility])  Closed fracture  of temporal bone, initial encounter ([place])  Closed nondisplaced fracture of left clavicle, unspecified part of clavicle, initial encounter  Closed fracture of multiple ribs of both sides, initial encounter  Hyponatremia  Closed fracture of left scapula, unspecified part of scapula, initial encounter  Closed fracture of sternum, unspecified portion of sternum, initial encounter  Rib fractures    Rx / DC Orders ED Discharge Orders    None       Deno Etienne, DO 11/23/19 [id number]

## 2019-11-22 NOTE — ED Notes (Signed)
Dr Grandville Silos with Trauma at the bedside

## 2019-11-22 NOTE — ED Notes (Addendum)
Pt is alert and oriented x 4. Pt follwos commands and is aware of his situation. A friend is at bedside. Pain level is a 8 out 10. Waiting on  Neuro Sur. Assessment. Pt in good spirits considering condition.  Due to multiple fractures upper and lower extremity modified to very little movement and not used to score modified NIH.

## 2019-11-22 NOTE — Progress Notes (Signed)
Orthopedic Tech Progress Note Patient Details:  Nathan [name] 1945/12/27 [id number]  Ortho Devices Type of Ortho Device: Sling immobilizer Ortho Device/Splint Location: LUE Ortho Device/Splint Interventions: Application   Post Interventions Patient Tolerated: Well Instructions Provided: Adjustment of device   [name] 11/22/2019, 10:15 PM

## 2019-11-22 NOTE — ED Notes (Signed)
This RN to CT with this pt. Pt remains alert, in NAD.

## 2019-11-22 NOTE — ED Notes (Signed)
Patient transported to CT 

## 2019-11-22 NOTE — ED Notes (Signed)
Spoke to Dr. Grandville Silos and cleared to give pain medication to the Pt and release orders

## 2019-11-22 NOTE — ED Notes (Signed)
Pt stated that the pain medication was effective and lowered his pain level to 5.

## 2019-11-22 NOTE — ED Notes (Signed)
Pt is resting well and Vitals are WDL

## 2019-11-22 NOTE — ED Provider Notes (Signed)
  Physical Exam  BP 136/71   Pulse (!) 102   Temp 97.6 F (36.4 C) (Oral)   Resp (!) 29   Ht 5\' 11"  (1.803 m)   Wt 98.4 kg   SpO2 93%   BMI 30.27 kg/m   Physical Exam  ED Course/Procedures     Procedures  MDM  Care assumed at 4 PM.  Patient had a unwitnessed fall in the bottom of the stairs.  Patient was noted to have left scalp hematoma and laceration that was stapled.  Patient was also noted to be hyponatremic to 124 .  Patient looks dehydrated and signed out pending CT head and neck.  5 pm CT showed subarachnoid hemorrhage and left temporal bone fracture.  Patient also has left clavicle fracture as well as possible left scapular fracture on CT.  In addition, patient has C7 fracture as well as T1-2 and 3 fractures.  I ordered Aspen collar.  We will bring patient back for CT chest abdomen pelvis.  I also discussed case with Dr. Doreatha Martin from Ortho.  Moreover, patient is on Eliquis so I ordered Kcentra to reverse.  7:07 PM CT showed multiple rib fractures and scapular fracture and nondisplaced sternal fracture.  Talked to Dr. Marcello Moores from neurosurgery who will see patient.  Also Dr. Grandville Silos from trauma surgery to admit.  CRITICAL CARE Performed by: Wandra Arthurs   Total critical care time: 40 minutes  Critical care time was exclusive of separately billable procedures and treating other patients.  Critical care was necessary to treat or prevent imminent or life-threatening deterioration.  Critical care was time spent personally by me on the following activities: development of treatment plan with patient and/or surrogate as well as nursing, discussions with consultants, evaluation of patient's response to treatment, examination of patient, obtaining history from patient or surrogate, ordering and performing treatments and interventions, ordering and review of laboratory studies, ordering and review of radiographic studies, pulse oximetry and re-evaluation of patient's  condition.       Drenda Freeze, MD 11/22/19 [place]

## 2019-11-22 NOTE — H&P (Signed)
Nathan [name] is an 74 y.o. male.   Chief Complaint: Head pain HPI: 74yo M came in as a level 2 trauma after falling down stairs.  He is amnestic to the event.  He underwent a thorough work-up in the emergency department and was found to have multiple injuries.  These include traumatic brain injury, scalp laceration, facial fractures, bilateral rib fractures, C7 fracture, T12 fracture, and left scapular fracture.  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  He was given Kcentra in the emergency department.  He complains of headache. No past medical history on file.  No family history on file. Social History:  has no history on file for tobacco, alcohol, and drug.  Allergies:  Allergies  Allergen Reactions  . Terazosin Other (See Comments)    unknown    (Not in a hospital admission)   Results for orders placed or performed during the hospital encounter of 11/22/19 (from the past 48 hour(s))  Sample to Blood Bank     Status: None   Collection Time: 11/22/19  3:09 PM  Result Value Ref Range   Blood Bank Specimen SAMPLE AVAILABLE FOR TESTING    Sample Expiration      11/23/2019,2359 Performed at [facility], [place] [address] [place], [place] [address]   Comprehensive metabolic panel     Status: Abnormal   Collection Time: 11/22/19  3:13 PM  Result Value Ref Range   Sodium 124 (L) 135 - 145 mmol/L   Potassium 4.8 3.5 - 5.1 mmol/L   Chloride 92 (L) 98 - 111 mmol/L   CO2 21 (L) 22 - 32 mmol/L   Glucose, Bld 165 (H) 70 - 99 mg/dL    Comment: Glucose reference range applies only to samples taken after fasting for at least 8 hours.   BUN 23 8 - 23 mg/dL   Creatinine, Ser 1.11 0.61 - 1.24 mg/dL   Calcium 8.4 (L) 8.9 - 10.3 mg/dL   Total Protein 6.9 6.5 - 8.1 g/dL   Albumin 3.1 (L) 3.5 - 5.0 g/dL   AST 30 15 - 41 U/L   ALT 18 0 - 44 U/L   Alkaline Phosphatase 55 38 - 126 U/L   Total Bilirubin 0.5 0.3 - 1.2 mg/dL   GFR calc non Af Amer >60 >60 mL/min   GFR calc  Af Amer >60 >60 mL/min   Anion gap 11 5 - 15    Comment: Performed at [facility], [place] [address], [place], [place] [address]  CBC     Status: Abnormal   Collection Time: 11/22/19  3:13 PM  Result Value Ref Range   WBC 19.4 (H) 4.0 - 10.5 K/uL   RBC 3.48 (L) 4.22 - 5.81 MIL/uL   Hemoglobin 10.3 (L) 13.0 - 17.0 g/dL   HCT 31.0 (L) 39.0 - 52.0 %   MCV 89.1 80.0 - 100.0 fL   MCH 29.6 26.0 - 34.0 pg   MCHC 33.2 30.0 - 36.0 g/dL   RDW 13.0 11.5 - 15.5 %   Platelets 327 150 - 400 K/uL   nRBC 0.0 0.0 - 0.2 %    Comment: Performed at [facility], [place] [address], [place], [place] [address]  Ethanol     Status: None   Collection Time: 11/22/19  3:13 PM  Result Value Ref Range   Alcohol, Ethyl (B) <10 <10 mg/dL    Comment: (NOTE) Lowest detectable limit for serum alcohol is 10  mg/dL. For medical purposes only. Performed at [facility], [place] [address] [place], [place] [address]   Lactic acid, plasma     Status: None   Collection Time: 11/22/19  3:13 PM  Result Value Ref Range   Lactic Acid, Venous 1.4 0.5 - 1.9 mmol/L    Comment: Performed at [place] [address], [place], [place] [address]  Protime-INR     Status: None   Collection Time: 11/22/19  3:13 PM  Result Value Ref Range   Prothrombin Time 15.1 11.4 - 15.2 seconds   INR 1.2 0.8 - 1.2    Comment: (NOTE) INR goal varies based on device and disease states. Performed at [facility], [place] [address], [place], [place] [address]   I-Stat Chem 8, ED     Status: Abnormal   Collection Time: 11/22/19  3:51 PM  Result Value Ref Range   Sodium 122 (L) 135 - 145 mmol/L   Potassium 4.7 3.5 - 5.1 mmol/L   Chloride 91 (L) 98 - 111 mmol/L   BUN 25 (H) 8 - 23 mg/dL   Creatinine, Ser 1.00 0.61 - 1.24 mg/dL   Glucose, Bld 158 (H) 70 - 99 mg/dL    Comment: Glucose reference range applies only to samples taken after fasting for at least 8 hours.   Calcium, Ion 1.05 (L) 1.15 - 1.40 mmol/L   TCO2 24  22 - 32 mmol/L   Hemoglobin 10.5 (L) 13.0 - 17.0 g/dL   HCT 31.0 (L) 39.0 - 52.0 %  Urinalysis, Routine w reflex microscopic     Status: Abnormal   Collection Time: 11/22/19  4:00 PM  Result Value Ref Range   Color, Urine YELLOW YELLOW   APPearance HAZY (A) CLEAR   Specific Gravity, Urine 1.017 1.005 - 1.030   pH 5.0 5.0 - 8.0   Glucose, UA NEGATIVE NEGATIVE mg/dL   Hgb urine dipstick LARGE (A) NEGATIVE   Bilirubin Urine NEGATIVE NEGATIVE   Ketones, ur NEGATIVE NEGATIVE mg/dL   Protein, ur NEGATIVE NEGATIVE mg/dL   Nitrite NEGATIVE NEGATIVE   Leukocytes,Ua NEGATIVE NEGATIVE   RBC / HPF 11-20 0 - 5 RBC/hpf   WBC, UA 0-5 0 - 5 WBC/hpf   Bacteria, UA RARE (A) NONE SEEN   Squamous Epithelial / LPF 0-5 0 - 5   Mucus PRESENT     Comment: Performed at [facility], [address] [place], [place] [address]  Osmolality     Status: Abnormal   Collection Time: 11/22/19  5:55 PM  Result Value Ref Range   Osmolality 263 (L) 275 - 295 mOsm/kg    Comment: Performed at [facility], [place] [address] [place], [place] [address]   CT HEAD WO CONTRAST  Result Date: 11/22/2019 CLINICAL DATA:  Head trauma, intracranial arterial injury suspected. Poly trauma, critical, head/cervical spine injury suspected. Additional history provided: Patient found down at the bottom of flight of stairs laceration to side of head. EXAM: CT HEAD WITHOUT CONTRAST CT CERVICAL SPINE WITHOUT CONTRAST TECHNIQUE: Multidetector CT imaging of the head and cervical spine was performed following the standard protocol without intravenous contrast. Multiplanar CT image reconstructions of the cervical spine were also generated. COMPARISON:  Head CT 09/30/2017. FINDINGS: CT HEAD FINDING: Brain: There is a hemorrhagic contusion within the inferolateral aspect of the right temporal lobe with overlying small volume acute subarachnoid hemorrhage (series 1, image 11) (series 5, images 39-48). Small volume acute subarachnoid  hemorrhage is also present along the undersurface of the anterior left temporal lobe (series 5, image 29) (series 6, image 47). Moderate ill-defined hypoattenuation within the cerebral white matter is nonspecific, but consistent with chronic small vessel ischemic disease. Mild generalized parenchymal atrophy. Findings are stable as compared to prior head CT 09/30/2017. No demarcated cortical infarct. No evidence of intracranial mass. No midline shift. Vascular: No hyperdense vessel.  Atherosclerotic calcifications. Skull: There is a nondisplaced fracture of the squamous left temporal bone (for instance as seen on series 4, images 41 and 36). There is an additional nondisplaced fracture of the left sphenoid bone (for instance as seen on series 4, images 20-24). Nondisplaced fracture of the left zygomatic arch (series 4, image 19). Additionally, there is a suspected minimally displaced fracture of the posterior wall the left maxillary sinus (series 4, image 17). Sinuses/Orbits: Visualized orbits show no acute finding. Air-fluid level within the left maxillary sinus. No significant mastoid effusion. Other: Sizable left-sided scalp hematoma. Laceration at this site with associated scalp staples and subcutaneous gas. CT CERVICAL SPINE FINDINGS Alignment: Straightening of the expected cervical lordosis. No significant spondylolisthesis. Skull base and vertebrae: The basion-dental and atlanto-dental intervals are maintained.There is an acute, mildly displaced fracture of the left C7 transverse process (series 5, image 70). No other acute fracture to the cervical spine is identified. Soft tissues and spinal canal: No prevertebral fluid or swelling. No visible canal hematoma. Disc levels: Cervical spondylosis with multilevel disc space narrowing, posterior disc osteophytes, uncovertebral and facet hypertrophy. There is degenerative fusion across the C5-C6 disc space. Upper chest: No consolidation within the imaged lung  apices. Trace left apical pneumothorax. Other: There is an acute displaced fracture of the left scapula (series 8, image 67). Acute, nondisplaced fracture of the posterior left first rib. Acute, displaced fracture of the lateral left first rib (series 8, image 70). Acute, mildly displaced fractures of the left T1, T2 and T3 transverse processes. These results were called by telephone at the time of interpretation on 11/22/2019 at 4:38 pm to provider Dr. Darl Householder, who verbally acknowledged these results. IMPRESSION: CT head: 1. Hemorrhagic contusion within the inferolateral right temporal lobe with overlying small volume acute subarachnoid hemorrhage. 2. Additional small volume acute subarachnoid hemorrhage along the undersurface of the anterior left temporal lobe. 3. Nondisplaced fractures of the squamous left temporal bone and of the left sphenoid bone. Additional nondisplaced fracture of the left zygomatic arch. A minimally displaced fracture of the posterior wall the left maxillary sinus is also suspected. Maxillofacial CT is recommended for further evaluation. 4. Sizable left scalp hematoma. Associated laceration at this site with scalp staples and subcutaneous gas. 5. Moderate chronic small vessel ischemic disease. 6. Mild generalized parenchymal atrophy. 7. Left sphenoid sinus air-fluid level, possibly reflecting blood. CT cervical spine: 1. Mildly displaced fracture of the left C7 transverse process. 2. Displaced fracture of the left scapula. 3. Displaced fracture of the posterior and lateral left first rib. 4. Mildly displaced fractures of the left T1, T2 and T3 transverse processes. 5. Trace left apical pneumothorax. 6. CT of the thorax is recommended for further evaluation. 7. Cervical spondylosis as described. Degenerative fusion across the C5-C6 disc space. Electronically Signed   By: Kellie Simmering DO   On: 11/22/2019 16:39   CT Chest W Contrast  Result Date: 11/22/2019 CLINICAL DATA:  Trauma. Found bottom  of flight of stairs. EXAM: CT CHEST, ABDOMEN, AND PELVIS WITH CONTRAST TECHNIQUE: Multidetector CT imaging of the chest, abdomen and pelvis  was performed following the standard protocol during bolus administration of intravenous contrast. CONTRAST:  185mL OMNIPAQUE IOHEXOL 300 MG/ML  SOLN COMPARISON:  None. FINDINGS: CT CHEST FINDINGS Cardiovascular: Aortic atherosclerosis without evidence of acute injury. Heart is normal in size. No pericardial effusion. There are coronary artery calcifications. Mediastinum/Nodes: No mediastinal hemorrhage or hematoma. No pneumomediastinum. No adenopathy. Decompressed esophagus with tiny hiatal hernia. Lungs/Pleura: No pneumothorax. No pulmonary contusion. There is left pleural thickening without significant effusion. Subpleural reticulation in the periphery of the lungs, basilar predominant. Anterior left upper lobe, subpleural reticulation is likely posttraumatic related to rib fractures. Trachea and mainstem bronchi are patent. Musculoskeletal: Displaced left scapular fracture involving the body and base of the acromium, best appreciated on reformatted views. There is associated soft tissue edema/hemorrhage. No definite extension to the glenohumeral joint. Fracture of the left anterior first rib which is comminuted. Fracture of the left anterior second rib at the costochondral junction. Nondisplaced left anterior third and fourth rib fractures. Heterogeneous stranding involving the intercostal musculature related to rib fractures. There is a nondisplaced fracture through the sternal manubrium. There are fractures of the posteromedial right eleventh and twelfth ribs at the costovertebral junction. Thoracic spine is assessed on dedicated reformats, reported separately. CT ABDOMEN PELVIS FINDINGS Hepatobiliary: Subcapsular low-density in the periphery of the right lobe of the liver follows the course the diaphragm and is likely rated to the diaphragmatic crus. There is no  perihepatic hematoma or free fluid. Small low-density lesions in the liver are too small to characterize but likely cysts. Gallbladder is unremarkable. Pancreas: Fatty atrophy of the pancreas without evidence of injury. No ductal dilatation or inflammation. Spleen: No splenic injury or perisplenic hematoma. Adrenals/Urinary Tract: No adrenal hemorrhage or renal injury identified. Tiny subcapsular hypodensities in the right kidney are too small to characterize but likely cysts. Bladder is unremarkable. Stomach/Bowel: Small hiatal hernia. Ingested material within the stomach without evidence of gastric injury. There is no evidence of bowel or mesenteric injury. No bowel wall thickening or inflammation. No mesenteric hematoma. No free air. Moderate volume of colonic stool. Minor distal colonic diverticulosis. Vascular/Lymphatic: No aortic injury. No retroperitoneal fluid. Aortic atherosclerosis. IVC appears intact. Few prominent bilateral external iliac nodes are nonspecific. Reproductive: Prostate is unremarkable. Other: No free air. No free fluid. No confluent body wall contusion. Musculoskeletal: Lumbar spine is better assessed on dedicated lumbar reformats. There is no acute pelvic fracture. IMPRESSION: 1. Displaced left scapular fracture involving the body and base of the acromium. No definite extension to the glenohumeral joint. 2. Left anterior first through fourth rib fractures, second rib fracture at the costochondral junction. No pneumothorax. 3. Nondisplaced sternal fracture. 4. Fractures of the posteromedial right eleventh and twelfth ribs at the costovertebral junction. 5. No additional acute traumatic injury to the chest, abdomen, or pelvis. 6. Thoracic and lumbar spine assessed on separately reported spine reformats. Aortic Atherosclerosis (ICD10-I70.0). Electronically Signed   By: Keith Rake M.D.   On: 11/22/2019 18:06   CT CERVICAL SPINE WO CONTRAST  Result Date: 11/22/2019 CLINICAL DATA:   Head trauma, intracranial arterial injury suspected. Poly trauma, critical, head/cervical spine injury suspected. Additional history provided: Patient found down at the bottom of flight of stairs laceration to side of head. EXAM: CT HEAD WITHOUT CONTRAST CT CERVICAL SPINE WITHOUT CONTRAST TECHNIQUE: Multidetector CT imaging of the head and cervical spine was performed following the standard protocol without intravenous contrast. Multiplanar CT image reconstructions of the cervical spine were also generated. COMPARISON:  Head CT 09/30/2017. FINDINGS:  CT HEAD FINDING: Brain: There is a hemorrhagic contusion within the inferolateral aspect of the right temporal lobe with overlying small volume acute subarachnoid hemorrhage (series 1, image 11) (series 5, images 39-48). Small volume acute subarachnoid hemorrhage is also present along the undersurface of the anterior left temporal lobe (series 5, image 29) (series 6, image 47). Moderate ill-defined hypoattenuation within the cerebral white matter is nonspecific, but consistent with chronic small vessel ischemic disease. Mild generalized parenchymal atrophy. Findings are stable as compared to prior head CT 09/30/2017. No demarcated cortical infarct. No evidence of intracranial mass. No midline shift. Vascular: No hyperdense vessel.  Atherosclerotic calcifications. Skull: There is a nondisplaced fracture of the squamous left temporal bone (for instance as seen on series 4, images 41 and 36). There is an additional nondisplaced fracture of the left sphenoid bone (for instance as seen on series 4, images 20-24). Nondisplaced fracture of the left zygomatic arch (series 4, image 19). Additionally, there is a suspected minimally displaced fracture of the posterior wall the left maxillary sinus (series 4, image 17). Sinuses/Orbits: Visualized orbits show no acute finding. Air-fluid level within the left maxillary sinus. No significant mastoid effusion. Other: Sizable left-sided  scalp hematoma. Laceration at this site with associated scalp staples and subcutaneous gas. CT CERVICAL SPINE FINDINGS Alignment: Straightening of the expected cervical lordosis. No significant spondylolisthesis. Skull base and vertebrae: The basion-dental and atlanto-dental intervals are maintained.There is an acute, mildly displaced fracture of the left C7 transverse process (series 5, image 70). No other acute fracture to the cervical spine is identified. Soft tissues and spinal canal: No prevertebral fluid or swelling. No visible canal hematoma. Disc levels: Cervical spondylosis with multilevel disc space narrowing, posterior disc osteophytes, uncovertebral and facet hypertrophy. There is degenerative fusion across the C5-C6 disc space. Upper chest: No consolidation within the imaged lung apices. Trace left apical pneumothorax. Other: There is an acute displaced fracture of the left scapula (series 8, image 67). Acute, nondisplaced fracture of the posterior left first rib. Acute, displaced fracture of the lateral left first rib (series 8, image 70). Acute, mildly displaced fractures of the left T1, T2 and T3 transverse processes. These results were called by telephone at the time of interpretation on 11/22/2019 at 4:38 pm to provider Dr. Darl Householder, who verbally acknowledged these results. IMPRESSION: CT head: 1. Hemorrhagic contusion within the inferolateral right temporal lobe with overlying small volume acute subarachnoid hemorrhage. 2. Additional small volume acute subarachnoid hemorrhage along the undersurface of the anterior left temporal lobe. 3. Nondisplaced fractures of the squamous left temporal bone and of the left sphenoid bone. Additional nondisplaced fracture of the left zygomatic arch. A minimally displaced fracture of the posterior wall the left maxillary sinus is also suspected. Maxillofacial CT is recommended for further evaluation. 4. Sizable left scalp hematoma. Associated laceration at this site  with scalp staples and subcutaneous gas. 5. Moderate chronic small vessel ischemic disease. 6. Mild generalized parenchymal atrophy. 7. Left sphenoid sinus air-fluid level, possibly reflecting blood. CT cervical spine: 1. Mildly displaced fracture of the left C7 transverse process. 2. Displaced fracture of the left scapula. 3. Displaced fracture of the posterior and lateral left first rib. 4. Mildly displaced fractures of the left T1, T2 and T3 transverse processes. 5. Trace left apical pneumothorax. 6. CT of the thorax is recommended for further evaluation. 7. Cervical spondylosis as described. Degenerative fusion across the C5-C6 disc space. Electronically Signed   By: Kellie Simmering DO   On: 11/22/2019 16:39  CT ABDOMEN PELVIS W CONTRAST  Result Date: 11/22/2019 CLINICAL DATA:  Trauma. Found bottom of flight of stairs. EXAM: CT CHEST, ABDOMEN, AND PELVIS WITH CONTRAST TECHNIQUE: Multidetector CT imaging of the chest, abdomen and pelvis was performed following the standard protocol during bolus administration of intravenous contrast. CONTRAST:  182mL OMNIPAQUE IOHEXOL 300 MG/ML  SOLN COMPARISON:  None. FINDINGS: CT CHEST FINDINGS Cardiovascular: Aortic atherosclerosis without evidence of acute injury. Heart is normal in size. No pericardial effusion. There are coronary artery calcifications. Mediastinum/Nodes: No mediastinal hemorrhage or hematoma. No pneumomediastinum. No adenopathy. Decompressed esophagus with tiny hiatal hernia. Lungs/Pleura: No pneumothorax. No pulmonary contusion. There is left pleural thickening without significant effusion. Subpleural reticulation in the periphery of the lungs, basilar predominant. Anterior left upper lobe, subpleural reticulation is likely posttraumatic related to rib fractures. Trachea and mainstem bronchi are patent. Musculoskeletal: Displaced left scapular fracture involving the body and base of the acromium, best appreciated on reformatted views. There is associated  soft tissue edema/hemorrhage. No definite extension to the glenohumeral joint. Fracture of the left anterior first rib which is comminuted. Fracture of the left anterior second rib at the costochondral junction. Nondisplaced left anterior third and fourth rib fractures. Heterogeneous stranding involving the intercostal musculature related to rib fractures. There is a nondisplaced fracture through the sternal manubrium. There are fractures of the posteromedial right eleventh and twelfth ribs at the costovertebral junction. Thoracic spine is assessed on dedicated reformats, reported separately. CT ABDOMEN PELVIS FINDINGS Hepatobiliary: Subcapsular low-density in the periphery of the right lobe of the liver follows the course the diaphragm and is likely rated to the diaphragmatic crus. There is no perihepatic hematoma or free fluid. Small low-density lesions in the liver are too small to characterize but likely cysts. Gallbladder is unremarkable. Pancreas: Fatty atrophy of the pancreas without evidence of injury. No ductal dilatation or inflammation. Spleen: No splenic injury or perisplenic hematoma. Adrenals/Urinary Tract: No adrenal hemorrhage or renal injury identified. Tiny subcapsular hypodensities in the right kidney are too small to characterize but likely cysts. Bladder is unremarkable. Stomach/Bowel: Small hiatal hernia. Ingested material within the stomach without evidence of gastric injury. There is no evidence of bowel or mesenteric injury. No bowel wall thickening or inflammation. No mesenteric hematoma. No free air. Moderate volume of colonic stool. Minor distal colonic diverticulosis. Vascular/Lymphatic: No aortic injury. No retroperitoneal fluid. Aortic atherosclerosis. IVC appears intact. Few prominent bilateral external iliac nodes are nonspecific. Reproductive: Prostate is unremarkable. Other: No free air. No free fluid. No confluent body wall contusion. Musculoskeletal: Lumbar spine is better  assessed on dedicated lumbar reformats. There is no acute pelvic fracture. IMPRESSION: 1. Displaced left scapular fracture involving the body and base of the acromium. No definite extension to the glenohumeral joint. 2. Left anterior first through fourth rib fractures, second rib fracture at the costochondral junction. No pneumothorax. 3. Nondisplaced sternal fracture. 4. Fractures of the posteromedial right eleventh and twelfth ribs at the costovertebral junction. 5. No additional acute traumatic injury to the chest, abdomen, or pelvis. 6. Thoracic and lumbar spine assessed on separately reported spine reformats. Aortic Atherosclerosis (ICD10-I70.0). Electronically Signed   By: Keith Rake M.D.   On: 11/22/2019 18:06   CT T-SPINE NO CHARGE  Result Date: 11/22/2019 CLINICAL DATA:  Trauma, found bottom of flight of stairs. EXAM: CT THORACIC SPINE WITHOUT CONTRAST TECHNIQUE: Multidetector CT images of the thoracic were obtained using the standard protocol without intravenous contrast. COMPARISON:  Chest radiograph 09/30/2017. No dedicated thoracic spine imaging. Lumbar radiographs  12/01/2017 FINDINGS: Alignment: Normal. Vertebrae: Mild T12 superior endplate compression fracture, new/progressed from 2019 radiograph. Prominent Schmorl's node superior endplate of T6 with minimal adjacent loss of height. There are left transverse process fractures of T1, T2, T3, T4, and T5. Paraspinal and other soft tissues: Left paraspinal soft tissue edema. Disc levels: Osseous projection in the dorsal canal the level of T3-T4 causes mild mass effect, appears to represent a spur rather than fracture fragment. Multilevel disc space narrowing and anterior spurring. No other areas of spinal canal narrowing. IMPRESSION: 1. Mild T12 superior endplate compression fracture, new/progressed from 2019 lumbar radiograph. 2. Acute left transverse process fractures of T1, T2, T3, T4, and T5. 3. Prominent Schmorl's node superior endplate of  T6 with slight loss of height, likely chronic but technically age indeterminate. Electronically Signed   By: Keith Rake M.D.   On: 11/22/2019 18:18   CT L-SPINE NO CHARGE  Result Date: 11/22/2019 CLINICAL DATA:  Trauma, found at bottom of stairs. EXAM: CT LUMBAR SPINE WITHOUT CONTRAST TECHNIQUE: Multidetector CT imaging of the lumbar spine was performed without intravenous contrast administration. Multiplanar CT image reconstructions were also generated. COMPARISON:  Lumbar spine radiographs 12/01/2017 FINDINGS: Segmentation: 5 lumbar type vertebrae. Alignment: Degenerative retrolisthesis of L3 on L4 and L1 on L2. Vertebrae: Mild superior endplate compression fracture of T12, new/progressed from 2019. The remaining lumbar vertebral body heights are preserved. There is no involvement or extension to the posterior elements. Suspected nondisplaced fracture of right L1 transverse process. Advanced degenerative disc disease and facet hypertrophy with prominent Modic endplate changes at 624THL and L4-L5. Paraspinal and other soft tissues: No definite edema related to T12 fracture. Paraspinal musculature is unremarkable. Disc levels: Advanced multilevel degenerative disc disease and prominent facet hypertrophy. Near complete disc space loss at L4-L5 and L5-S1. Multilevel vacuum phenomenon. There is bony canal and neural foraminal stenosis at multiple levels. IMPRESSION: 1. Mild superior endplate compression fracture of T12, new/progressed from 2019, technically age indeterminate but may be acute. 2. Suspected acute nondisplaced fracture of right L1 transverse process. 3. Advanced multilevel degenerative disc disease and facet hypertrophy throughout the lumbar spine. Electronically Signed   By: Keith Rake M.D.   On: 11/22/2019 18:11   DG Chest Port 1 View  Result Date: 11/22/2019 CLINICAL DATA:  Fall EXAM: PORTABLE CHEST 1 VIEW COMPARISON:  09/30/2017 FINDINGS: The heart size and mediastinal contours are  within normal limits. Atherosclerotic calcification of the aortic knob. No focal airspace consolidation, pleural effusion, or pneumothorax. The visualized skeletal structures are unremarkable. IMPRESSION: No acute cardiopulmonary findings. Electronically Signed   By: Davina Poke D.O.   On: 11/22/2019 15:40   DG Shoulder Left Portable  Result Date: 11/22/2019 CLINICAL DATA:  Pain following fall EXAM: LEFT SHOULDER COMPARISON:  None. FINDINGS: Oblique and Y scapular images were obtained. There is a comminuted fracture of the lateral aspect of the clavicle with mild displacement of fracture fragments. No other fracture evident. There is a small chronic appearing avulsion along the inferior glenoid as well as cortical irregularity along the lateral humeral head, findings likely indicative of previous anterior shoulder dislocation. There is mild narrowing of the glenohumeral joint. Acromioclavicular joint appears unremarkable. IMPRESSION: 1.  Acute appearing comminuted fracture lateral left clavicle. 2. 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 3.  Narrowing glenohumeral joint. Electronically Signed   By: Lowella Grip III M.D.  On: 11/22/2019 15:46    Review of Systems  Constitutional: Negative for activity change.  HENT: Positive for sinus pain.   Eyes: Negative.   Respiratory:       Rib pain  Cardiovascular: Negative.   Gastrointestinal: Negative.   Endocrine: Negative.   Genitourinary: Negative.   Musculoskeletal:       L and lower back pain  Skin: Negative.   Neurological: Positive for headaches. Negative for weakness.       Amnestic to fall  Hematological: Bruises/bleeds easily.  Psychiatric/Behavioral: Negative.     Blood pressure 136/71, pulse (!) 102, temperature 97.6 F (36.4 C), temperature source  Oral, resp. rate (!) 29, height 5\' 11"  (1.803 m), weight 98.4 kg, SpO2 93 %. Physical Exam  Constitutional: He is oriented to person, place, and time. He appears well-developed and well-nourished. No distress.  HENT:  Right Ear: External ear normal.  Large left scalp laceration repaired with staples  Eyes: Pupils are equal, round, and reactive to light. EOM are normal. Right eye exhibits no discharge. Left eye exhibits no discharge. No scleral icterus.  Neck:  Collar in place  Cardiovascular: Normal rate, regular rhythm, normal heart sounds and intact distal pulses.  Respiratory: Effort normal and breath sounds normal. No respiratory distress. He has no wheezes. He has no rales. He exhibits tenderness.  GI: Soft. He exhibits no distension. There is no abdominal tenderness. There is no rebound and no guarding.  No hepatosplenomegaly  Musculoskeletal:     Comments: Tender left scapula  Neurological: He is alert and oriented to person, place, and time. He displays no atrophy and no tremor. No cranial nerve deficit. He exhibits normal muscle tone. He displays no seizure activity. GCS eye subscore is 4. GCS verbal subscore is 5. GCS motor subscore is 6.  Good strength in extremities  Skin: Skin is warm.  Old scabs right shin  Psychiatric: He has a normal mood and affect.   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 Doreatha Martin consulted for scapula fracture Dr. Marcello Moores consulted for TBI, C7 fracture, T12 fracture Kcentra already given in ED Plan follow-up CT head in a.m.  Will obtain maxillofacial CT at that time to further delineate facial fractures.  Zenovia Jarred, MD 11/22/2019, 6:51 PM

## 2019-11-22 NOTE — ED Notes (Signed)
[place] J collar placed 

## 2019-11-22 NOTE — Progress Notes (Signed)
Ortho Trauma Progress Note  Reviewed imaging. Left distal clavicle fracture that is well aligned on radiographs. Will plan for nonoperative treatment. Patient can be NWB in a sling. Formal consult to follow tomorrow AM.  Shona Needles, MD [facility] [phone] (office) [email or web address]

## 2019-11-22 NOTE — ED Notes (Signed)
Pt's friend has left bedside to go home. Pt is still A&O. Pt. Will destat when he sleeps and then returns to 95-97% on room air. Monitoring situtaion.

## 2019-11-23 ENCOUNTER — Inpatient Hospital Stay (HOSPITAL_COMMUNITY): Payer: No Typology Code available for payment source

## 2019-11-23 ENCOUNTER — Encounter (HOSPITAL_COMMUNITY): Payer: Self-pay | Admitting: General Surgery

## 2019-11-23 LAB — CBC
HCT: 33.5 % — ABNORMAL LOW (ref 39.0–52.0)
Hemoglobin: 11.1 g/dL — ABNORMAL LOW (ref 13.0–17.0)
MCH: 30.6 pg (ref 26.0–34.0)
MCHC: 33.1 g/dL (ref 30.0–36.0)
MCV: 92.3 fL (ref 80.0–100.0)
Platelets: 280 10*3/uL (ref 150–400)
RBC: 3.63 MIL/uL — ABNORMAL LOW (ref 4.22–5.81)
RDW: 13.2 % (ref 11.5–15.5)
WBC: 9.6 10*3/uL (ref 4.0–10.5)
nRBC: 0 % (ref 0.0–0.2)

## 2019-11-23 LAB — PROTIME-INR
INR: 1.2 (ref 0.8–1.2)
Prothrombin Time: 14.4 seconds (ref 11.4–15.2)

## 2019-11-23 LAB — BASIC METABOLIC PANEL
Anion gap: 12 (ref 5–15)
BUN: 17 mg/dL (ref 8–23)
CO2: 18 mmol/L — ABNORMAL LOW (ref 22–32)
Calcium: 8.3 mg/dL — ABNORMAL LOW (ref 8.9–10.3)
Chloride: 95 mmol/L — ABNORMAL LOW (ref 98–111)
Creatinine, Ser: 0.9 mg/dL (ref 0.61–1.24)
GFR calc Af Amer: >60 mL/min (ref >60)
GFR calc non Af Amer: >60 mL/min (ref >60)
Glucose, Bld: 129 mg/dL — ABNORMAL HIGH (ref 70–99)
Potassium: 4.6 mmol/L (ref 3.5–5.1)
Sodium: 125 mmol/L — ABNORMAL LOW (ref 135–145)

## 2019-11-23 LAB — MRSA PCR SCREENING: MRSA by PCR: NEGATIVE

## 2019-11-23 MED ORDER — CHLORHEXIDINE GLUCONATE CLOTH 2 % EX PADS
6.0000 | MEDICATED_PAD | Freq: Every day | CUTANEOUS | Status: DC
  Administered 2019-11-23 – 2019-11-30 (×5): 6 via TOPICAL

## 2019-11-23 MED ORDER — METOPROLOL TARTRATE 5 MG/5ML IV SOLN
5.0000 mg | Freq: Four times a day (QID) | INTRAVENOUS | Status: DC | PRN
  Administered 2019-11-23: 5 mg via INTRAVENOUS
  Filled 2019-11-23: qty 5

## 2019-11-23 MED ORDER — METOPROLOL TARTRATE 5 MG/5ML IV SOLN
INTRAVENOUS | Status: AC
  Filled 2019-11-23: qty 5

## 2019-11-23 MED ORDER — LEVETIRACETAM 500 MG PO TABS
500.0000 mg | ORAL_TABLET | Freq: Two times a day (BID) | ORAL | Status: AC
  Administered 2019-11-23 – 2019-11-29 (×14): 500 mg via ORAL
  Filled 2019-11-23 (×14): qty 1

## 2019-11-23 NOTE — Consult Note (Signed)
Subjective:   Patient is a 74 y.o. male on Eliquis who suffered a fall from standing with no LOC.  Found to have a small ICH, was given K-centra for reversal.  Currently no headache, n/v.  He complains of upper back pain, some mild neck discomfort.  No lower or mid-back pain.  He is in a C-collar.  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     Patient Active Problem List   Diagnosis Date Noted  . TBI (traumatic brain injury) ([place]) 11/22/2019   History reviewed. No pertinent past medical history.  History reviewed. No pertinent surgical history.  Medications Prior to Admission  Medication Sig Dispense Refill Last Dose  . Apixaban (ELIQUIS PO) Take 10 mg by mouth in the morning and at bedtime.   11/22/2019 at 9 am  . ARIPiprazole (ABILIFY) 15 MG tablet Take 7.5 mg by mouth daily.   11/22/2019 at Unknown time  . fluticasone (FLONASE) 50 MCG/ACT nasal spray Place 2 sprays into both nostrils daily.   11/22/2019 at Unknown time  . lisinopril (ZESTRIL) 10 MG tablet Take 10 mg by mouth daily.   11/22/2019 at Unknown time  . metoprolol tartrate (LOPRESSOR) 50 MG tablet Take 50 mg by mouth 2 (two) times daily.   11/22/2019 at 9 am  . Multiple Vitamin (MULTIVITAMIN WITH MINERALS) TABS tablet Take 1 tablet by mouth daily.   11/22/2019 at Unknown time  . Omega-3 Fatty Acids (OMEGA 3 500 PO) Take 1,000 mg by mouth daily.   11/22/2019 at Unknown time  . omeprazole (PRILOSEC) 10 MG capsule Take 10 mg by mouth daily.   11/22/2019 at Unknown time  . simvastatin (ZOCOR) 5 MG tablet Take 2.5 mg by mouth daily.   11/22/2019 at Unknown time  . venlafaxine (EFFEXOR) 100 MG tablet Take 300 mg by mouth daily.   11/22/2019 at Unknown time   Allergies  Allergen Reactions  . Terazosin Other (See Comments)    unknown    Social History   Tobacco Use  . Smoking status: Not on file  Substance Use Topics  . Alcohol use: Not on file    No family history on file.  Review of  Systems Pertinent items are noted in HPI.  Objective:   Patient Vitals for the past 8 hrs:  BP Temp Temp src Pulse Resp SpO2 Height  11/23/19 0800 (!) 144/89 98.7 F (37.1 C) Oral 92 (!) 22 96 % --  11/23/19 0700 (!) 141/75 -- -- 78 (!) 21 94 % --  11/23/19 0600 (!) 162/99 97.8 F (36.6 C) Oral 97 13 96 % 5\' 11"  (1.803 m)  11/23/19 0530 (!) 192/94 -- -- 94 20 98 % --  11/23/19 0500 (!) 143/82 -- -- 93 20 96 % --  11/23/19 0442 (!) 148/76 -- -- 92 20 98 % --  11/23/19 0400 (!) 153/91 -- -- 98 13 98 % --  11/23/19 0300 (!) 146/99 -- -- 93 18 98 % --  11/23/19 0203 (!) 150/116 -- -- 98 19 99 % --   I/O last 3 completed shifts: In: 2007.2 [I.V.:2007.2] Out: 630 [Urine:630] Total I/O In: 75.1 [I.V.:75.1] Out: 150 [Urine:150]    BP (!) 144/89 (BP Location: Right Arm)   Pulse 92   Temp 98.7 F (37.1 C) (Oral)   Resp (!) 22   Ht 5\' 11"  (1.803 m)   Wt 98.4 kg   SpO2 96%   BMI 30.27 kg/m  General Appearance:    Alert, cooperative, no distress, appears stated age  Head:    Left scalp laceration, repaired  Eyes:    PERRL, conjunctiva/corneas clear, EOM's intact, fundi    benign, both eyes                Neck:   C-collar in place  Back:     Symmetric, no curvature, ROM normal, no CVA tenderness  Lungs:     respirations unlabored  Chest wall:    No tenderness or deformity  Heart:   regular rate           Extremities:   Left arm in sling  Pulses:   2+ and symmetric all extremities  Skin:   Skin color, texture, turgor normal, no rashes or lesions     Neurologic:   CNII-XII intact. Normal strength, sensation and reflexes      throughout    Data Review CBC:  Lab Results  Component Value Date   WBC 9.6 11/23/2019   RBC 3.63 (L) 11/23/2019   BMP:  Lab Results  Component Value Date   GLUCOSE 129 (H) 11/23/2019   CO2 18 (L) 11/23/2019   BUN 17 11/23/2019   CREATININE 0.90 11/23/2019   CALCIUM 8.3 (L) 11/23/2019   Coagulation:  Lab Results  Component Value  Date   INR 1.2 11/23/2019   CT head, C-spine, T and L spine reviewed. C7 and upper T-spine TP fractures seen,  Mild T12 superior endplate fracture, unknown chronicity.   Assessment:   Active Problems:   TBI (traumatic brain injury) [facility])  74 yo M s/p fall on Eliquis who suffered polytrauma, including a small right inferior temporal lobe contusion which is stable on repeat imaging.  He also has TP fractures of C7 and upper T-spine and T12 superior endplate fracture of unknown chronicity.  Plan:  - as his CT head is stable, patient can be downgraded with less frequent neurologic exams. - I recommend he continue his anti-epileptics that he currently takes (oxcarbamezepine) - can keep c-collar for now, though he does not have an unstable injury. He will need to f/u in clinic in 4-6 weeks with cervical spine x-rays including flex/ex for C-spine clearance. - regarding his T12 endplate fracture, if he is having lower/mid back pain with standing correlating with this area, would recommend a TLSO brace.  Otherwise, no need for brace or f/u imaging.  He verbalized understanding and agreement with the plan

## 2019-11-23 NOTE — Consult Note (Signed)
Reason for Consult:Left clav/scap fxs Referring Physician: B Ehrin [name] is an 74 y.o. male.  HPI: Nathan [name] fell down a flight of stairs yesterday. He suffered a number of injuries including a left clavicle and scapula fx. He was admitted by the trauma service and orthopedic surgery was consulted. He is relatively comfortable this morning. He is RHD.  History reviewed. No pertinent past medical history.  History reviewed. No pertinent surgical history.  No family history on file.  Social History:  has no history on file for tobacco, alcohol, and drug.  Allergies:  Allergies  Allergen Reactions  . Terazosin Other (See Comments)    unknown    Medications: I have reviewed the patient's current medications.  Results for orders placed or performed during the hospital encounter of 11/22/19 (from the past 48 hour(s))  Sample to Blood Bank     Status: None   Collection Time: 11/22/19  3:09 PM  Result Value Ref Range   Blood Bank Specimen SAMPLE AVAILABLE FOR TESTING    Sample Expiration      11/23/2019,2359 Performed at [facility], [place] [address] [place], [place] [address]   Comprehensive metabolic panel     Status: Abnormal   Collection Time: 11/22/19  3:13 PM  Result Value Ref Range   Sodium 124 (L) 135 - 145 mmol/L   Potassium 4.8 3.5 - 5.1 mmol/L   Chloride 92 (L) 98 - 111 mmol/L   CO2 21 (L) 22 - 32 mmol/L   Glucose, Bld 165 (H) 70 - 99 mg/dL    Comment: Glucose reference range applies only to samples taken after fasting for at least 8 hours.   BUN 23 8 - 23 mg/dL   Creatinine, Ser 1.11 0.61 - 1.24 mg/dL   Calcium 8.4 (L) 8.9 - 10.3 mg/dL   Total Protein 6.9 6.5 - 8.1 g/dL   Albumin 3.1 (L) 3.5 - 5.0 g/dL   AST 30 15 - 41 U/L   ALT 18 0 - 44 U/L   Alkaline Phosphatase 55 38 - 126 U/L   Total Bilirubin 0.5 0.3 - 1.2 mg/dL   GFR calc non Af Amer >60 >60 mL/min   GFR calc Af Amer >60 >60 mL/min   Anion gap 11 5 - 15    Comment: Performed at [facility], [place] [address], [place], [place] [address]  CBC     Status: Abnormal   Collection Time: 11/22/19  3:13 PM  Result Value Ref Range   WBC 19.4 (H) 4.0 - 10.5 K/uL   RBC 3.48 (L) 4.22 - 5.81 MIL/uL   Hemoglobin 10.3 (L) 13.0 - 17.0 g/dL   HCT 31.0 (L) 39.0 - 52.0 %   MCV 89.1 80.0 - 100.0 fL   MCH 29.6 26.0 - 34.0 pg   MCHC 33.2 30.0 - 36.0 g/dL   RDW 13.0 11.5 - 15.5 %   Platelets 327 150 - 400 K/uL   nRBC 0.0 0.0 - 0.2 %    Comment: Performed at [facility], [place] [address] [place], [place] [address]  Ethanol     Status: None   Collection Time: 11/22/19  3:13 PM  Result Value Ref Range   Alcohol, Ethyl (Nathan) <10 <10 mg/dL    Comment: (NOTE) Lowest detectable limit for serum alcohol is 10 mg/dL. For medical purposes only. Performed at [facility], [place] [address], [place], [place] [address]   Lactic acid, plasma  Status: None   Collection Time: 11/22/19  3:13 PM  Result Value Ref Range   Lactic Acid, Venous 1.4 0.5 - 1.9 mmol/L    Comment: Performed at [facility] [address] [place], [place] [address]  Protime-INR     Status: None   Collection Time: 11/22/19  3:13 PM  Result Value Ref Range   Prothrombin Time 15.1 11.4 - 15.2 seconds   INR 1.2 0.8 - 1.2    Comment: (NOTE) INR goal varies based on device and disease states. Performed at [facility], [place] [address], [place], [place] [address]   I-Stat Chem 8, ED     Status: Abnormal   Collection Time: 11/22/19  3:51 PM  Result Value Ref Range   Sodium 122 (L) 135 - 145 mmol/L   Potassium 4.7 3.5 - 5.1 mmol/L   Chloride 91 (L) 98 - 111 mmol/L   BUN 25 (H) 8 - 23 mg/dL   Creatinine, Ser 1.00 0.61 - 1.24 mg/dL   Glucose, Bld 158 (H) 70 - 99 mg/dL    Comment: Glucose reference range applies only to samples taken after fasting for at least 8 hours.   Calcium, Ion 1.05 (L) 1.15 - 1.40 mmol/L   TCO2 24 22 - 32 mmol/L   Hemoglobin 10.5 (L) 13.0 - 17.0 g/dL   HCT 31.0 (L) 39.0 -  52.0 %  Urinalysis, Routine w reflex microscopic     Status: Abnormal   Collection Time: 11/22/19  4:00 PM  Result Value Ref Range   Color, Urine YELLOW YELLOW   APPearance HAZY (A) CLEAR   Specific Gravity, Urine 1.017 1.005 - 1.030   pH 5.0 5.0 - 8.0   Glucose, UA NEGATIVE NEGATIVE mg/dL   Hgb urine dipstick LARGE (A) NEGATIVE   Bilirubin Urine NEGATIVE NEGATIVE   Ketones, ur NEGATIVE NEGATIVE mg/dL   Protein, ur NEGATIVE NEGATIVE mg/dL   Nitrite NEGATIVE NEGATIVE   Leukocytes,Ua NEGATIVE NEGATIVE   RBC / HPF 11-20 0 - 5 RBC/hpf   WBC, UA 0-5 0 - 5 WBC/hpf   Bacteria, UA RARE (A) NONE SEEN   Squamous Epithelial / LPF 0-5 0 - 5   Mucus PRESENT     Comment: Performed at [facility], [address] [place], [place] [address]  Osmolality     Status: Abnormal   Collection Time: 11/22/19  5:55 PM  Result Value Ref Range   Osmolality 263 (L) 275 - 295 mOsm/kg    Comment: Performed at [facility], [place] [address] [place], [place] [address]  Respiratory Panel by RT PCR (Flu A&Nathan, Covid) - Nasopharyngeal Swab     Status: None   Collection Time: 11/22/19  5:57 PM   Specimen: Nasopharyngeal Swab  Result Value Ref Range   SARS Coronavirus 2 by RT PCR NEGATIVE NEGATIVE    Comment: (NOTE) SARS-CoV-2 target nucleic acids are NOT DETECTED. The SARS-CoV-2 RNA is generally detectable in upper respiratoy specimens during the acute phase of infection. The lowest concentration of SARS-CoV-2 viral copies this assay can detect is 131 copies/mL. A negative result does not preclude SARS-Cov-2 infection and should not be used as the sole basis for treatment or other patient management decisions. A negative result may occur with  improper specimen collection/handling, submission of specimen other than nasopharyngeal swab, presence of viral mutation(s) within the areas targeted by this assay, and inadequate number of viral copies (<131 copies/mL). A negative result must be combined with  clinical observations, patient history, and epidemiological information. The expected result is Negative. Fact Sheet for Patients:  PinkCheek.be Fact Sheet for Healthcare Providers:  GravelBags.it This test is not yet ap proved or cleared by the [place] FDA and  has been authorized for detection and/or diagnosis of SARS-CoV-2 by FDA under an Emergency Use Authorization (EUA). This EUA will remain  in effect (meaning this test can be used) for the duration of the COVID-19 declaration under Section 564(Nathan)(1) of the Act, 21 U.S.C. section 360bbb-3(Nathan)(1), unless the authorization is terminated or revoked sooner.    Influenza A by PCR NEGATIVE NEGATIVE   Influenza Nathan by PCR NEGATIVE NEGATIVE    Comment: (NOTE) The Xpert Xpress SARS-CoV-2/FLU/RSV assay is intended as an aid in  the diagnosis of influenza from Nasopharyngeal swab specimens and  should not be used as a sole basis for treatment. Nasal washings and  aspirates are unacceptable for Xpert Xpress SARS-CoV-2/FLU/RSV  testing. Fact Sheet for Patients: PinkCheek.be Fact Sheet for Healthcare Providers: GravelBags.it This test is not yet approved or cleared by the [place] FDA and  has been authorized for detection and/or diagnosis of SARS-CoV-2 by  FDA under an Emergency Use Authorization (EUA). This EUA will remain  in effect (meaning this test can be used) for the duration of the  Covid-19 declaration under Section 564(Nathan)(1) of the Act, 21  U.S.C. section 360bbb-3(Nathan)(1), unless the authorization is  terminated or revoked. Performed at [facility], [place] [address], [place], [place] [address]   CBC     Status: Abnormal   Collection Time: 11/23/19  4:17 AM  Result Value Ref Range   WBC 9.6 4.0 - 10.5 K/uL   RBC 3.63 (L) 4.22 - 5.81 MIL/uL   Hemoglobin 11.1 (L) 13.0 - 17.0 g/dL   HCT 33.5 (L) 39.0 -  52.0 %   MCV 92.3 80.0 - 100.0 fL   MCH 30.6 26.0 - 34.0 pg   MCHC 33.1 30.0 - 36.0 g/dL   RDW 13.2 11.5 - 15.5 %   Platelets 280 150 - 400 K/uL   nRBC 0.0 0.0 - 0.2 %    Comment: Performed at [facility], [place] [address], [place], [place] [id number]  Basic metabolic panel     Status: Abnormal   Collection Time: 11/23/19  4:17 AM  Result Value Ref Range   Sodium 125 (L) 135 - 145 mmol/L   Potassium 4.6 3.5 - 5.1 mmol/L   Chloride 95 (L) 98 - 111 mmol/L   CO2 18 (L) 22 - 32 mmol/L   Glucose, Bld 129 (H) 70 - 99 mg/dL    Comment: Glucose reference range applies only to samples taken after fasting for at least 8 hours.   BUN 17 8 - 23 mg/dL   Creatinine, Ser 0.90 0.61 - 1.24 mg/dL   Calcium 8.3 (L) 8.9 - 10.3 mg/dL   GFR calc non Af Amer >60 >60 mL/min   GFR calc Af Amer >60 >60 mL/min   Anion gap 12 5 - 15    Comment: Performed at [facility] [address], [place], [place] [address]  Protime-INR     Status: None   Collection Time: 11/23/19  4:17 AM  Result Value Ref Range   Prothrombin Time 14.4 11.4 - 15.2 seconds   INR 1.2 0.8 - 1.2    Comment: (NOTE) INR goal varies based on device and disease states. Performed at [facility], [place]  [address] [place], [place] [address]   MRSA PCR Screening     Status: None   Collection Time: 11/23/19  6:11 AM   Specimen: Nasal Mucosa; Nasopharyngeal  Result Value Ref Range   MRSA by PCR NEGATIVE NEGATIVE    Comment:        The GeneXpert MRSA Assay (FDA approved for NASAL specimens only), is one component of a comprehensive MRSA colonization surveillance program. It is not intended to diagnose MRSA infection nor to guide or monitor treatment for MRSA infections. Performed at [facility], [place] [address], [place], [place] [address]     CT HEAD WO CONTRAST  Result Date: 11/23/2019 CLINICAL DATA:  Facial trauma follow-up EXAM: CT HEAD WITHOUT CONTRAST CT MAXILLOFACIAL WITHOUT CONTRAST TECHNIQUE:  Multidetector CT imaging of the head and maxillofacial structures were performed using the standard protocol without intravenous contrast. Multiplanar CT image reconstructions of the maxillofacial structures were also generated. COMPARISON:  Head CT from yesterday FINDINGS: CT HEAD FINDINGS Brain: Right inferior temporal hemorrhagic contusion with no progression. The largest pocket of blood is 5 mm. There could be overlying subarachnoid blood which is also not progressed. Minimal subdural hemorrhage seen along the right tentorium. No left-sided hemorrhage seen today. No infarct or hydrocephalus. Chronic small vessel ischemia in the deep cerebral white matter Vascular: No hyperdense vessel or unexpected calcification. Skull: Limited, nondisplaced fracture through the left calvarium. Left scalp hematoma with progressive swelling. CT MAXILLOFACIAL FINDINGS Osseous: Nondepressed left zygomatic arch fracture. There is mild deformity of the posterior left maxillary sinus wall without definite acute fracture at this level. Certainly no tripod complex fracture. Mandible is intact and located Orbits: No evidence of injury Sinuses: High-density in the left sphenoid sinus could reflect hemosinus (or incidental inflammatory inspissated material) but no visible underlying fracture. Soft tissues: Left scalp swelling and gas IMPRESSION: 1. Unchanged right inferior temporal hemorrhagic contusion. 2. Trace subdural hemorrhage is now seen along the right tentorium. 3. Left calvarial and zygomatic arch fractures, nondisplaced. Electronically Signed   By: Monte Fantasia M.D.   On: 11/23/2019 05:39   CT HEAD WO CONTRAST  Result Date: 11/22/2019 CLINICAL DATA:  Head trauma, intracranial arterial injury suspected. Poly trauma, critical, head/cervical spine injury suspected. Additional history provided: Patient found down at the bottom of flight of stairs laceration to side of head. EXAM: CT HEAD WITHOUT CONTRAST CT CERVICAL SPINE  WITHOUT CONTRAST TECHNIQUE: Multidetector CT imaging of the head and cervical spine was performed following the standard protocol without intravenous contrast. Multiplanar CT image reconstructions of the cervical spine were also generated. COMPARISON:  Head CT 09/30/2017. FINDINGS: CT HEAD FINDING: Brain: There is a hemorrhagic contusion within the inferolateral aspect of the right temporal lobe with overlying small volume acute subarachnoid hemorrhage (series 1, image 11) (series 5, images 39-48). Small volume acute subarachnoid hemorrhage is also present along the undersurface of the anterior left temporal lobe (series 5, image 29) (series 6, image 47). Moderate ill-defined hypoattenuation within the cerebral white matter is nonspecific, but consistent with chronic small vessel ischemic disease. Mild generalized parenchymal atrophy. Findings are stable as compared to prior head CT 09/30/2017. No demarcated cortical infarct. No evidence of intracranial mass. No midline shift. Vascular: No hyperdense vessel.  Atherosclerotic calcifications. Skull: There is a nondisplaced fracture of the squamous left temporal bone (for instance as seen on series 4, images 41 and 36). There is an additional nondisplaced fracture of the left sphenoid bone (for instance as seen on series 4, images 20-24). Nondisplaced  fracture of the left zygomatic arch (series 4, image 19). Additionally, there is a suspected minimally displaced fracture of the posterior wall the left maxillary sinus (series 4, image 17). Sinuses/Orbits: Visualized orbits show no acute finding. Air-fluid level within the left maxillary sinus. No significant mastoid effusion. Other: Sizable left-sided scalp hematoma. Laceration at this site with associated scalp staples and subcutaneous gas. CT CERVICAL SPINE FINDINGS Alignment: Straightening of the expected cervical lordosis. No significant spondylolisthesis. Skull base and vertebrae: The basion-dental and  atlanto-dental intervals are maintained.There is an acute, mildly displaced fracture of the left C7 transverse process (series 5, image 70). No other acute fracture to the cervical spine is identified. Soft tissues and spinal canal: No prevertebral fluid or swelling. No visible canal hematoma. Disc levels: Cervical spondylosis with multilevel disc space narrowing, posterior disc osteophytes, uncovertebral and facet hypertrophy. There is degenerative fusion across the C5-C6 disc space. Upper chest: No consolidation within the imaged lung apices. Trace left apical pneumothorax. Other: There is an acute displaced fracture of the left scapula (series 8, image 67). Acute, nondisplaced fracture of the posterior left first rib. Acute, displaced fracture of the lateral left first rib (series 8, image 70). Acute, mildly displaced fractures of the left T1, T2 and T3 transverse processes. These results were called by telephone at the time of interpretation on 11/22/2019 at 4:38 pm to provider Dr. Darl Householder, who verbally acknowledged these results. IMPRESSION: CT head: 1. Hemorrhagic contusion within the inferolateral right temporal lobe with overlying small volume acute subarachnoid hemorrhage. 2. Additional small volume acute subarachnoid hemorrhage along the undersurface of the anterior left temporal lobe. 3. Nondisplaced fractures of the squamous left temporal bone and of the left sphenoid bone. Additional nondisplaced fracture of the left zygomatic arch. A minimally displaced fracture of the posterior wall the left maxillary sinus is also suspected. Maxillofacial CT is recommended for further evaluation. 4. Sizable left scalp hematoma. Associated laceration at this site with scalp staples and subcutaneous gas. 5. Moderate chronic small vessel ischemic disease. 6. Mild generalized parenchymal atrophy. 7. Left sphenoid sinus air-fluid level, possibly reflecting blood. CT cervical spine: 1. Mildly displaced fracture of the left C7  transverse process. 2. Displaced fracture of the left scapula. 3. Displaced fracture of the posterior and lateral left first rib. 4. Mildly displaced fractures of the left T1, T2 and T3 transverse processes. 5. Trace left apical pneumothorax. 6. CT of the thorax is recommended for further evaluation. 7. Cervical spondylosis as described. Degenerative fusion across the C5-C6 disc space. Electronically Signed   By: Kellie Simmering DO   On: 11/22/2019 16:39   CT Chest W Contrast  Result Date: 11/22/2019 CLINICAL DATA:  Trauma. Found bottom of flight of stairs. EXAM: CT CHEST, ABDOMEN, AND PELVIS WITH CONTRAST TECHNIQUE: Multidetector CT imaging of the chest, abdomen and pelvis was performed following the standard protocol during bolus administration of intravenous contrast. CONTRAST:  16mL OMNIPAQUE IOHEXOL 300 MG/ML  SOLN COMPARISON:  None. FINDINGS: CT CHEST FINDINGS Cardiovascular: Aortic atherosclerosis without evidence of acute injury. Heart is normal in size. No pericardial effusion. There are coronary artery calcifications. Mediastinum/Nodes: No mediastinal hemorrhage or hematoma. No pneumomediastinum. No adenopathy. Decompressed esophagus with tiny hiatal hernia. Lungs/Pleura: No pneumothorax. No pulmonary contusion. There is left pleural thickening without significant effusion. Subpleural reticulation in the periphery of the lungs, basilar predominant. Anterior left upper lobe, subpleural reticulation is likely posttraumatic related to rib fractures. Trachea and mainstem bronchi are patent. Musculoskeletal: Displaced left scapular fracture involving the body  and base of the acromium, best appreciated on reformatted views. There is associated soft tissue edema/hemorrhage. No definite extension to the glenohumeral joint. Fracture of the left anterior first rib which is comminuted. Fracture of the left anterior second rib at the costochondral junction. Nondisplaced left anterior third and fourth rib fractures.  Heterogeneous stranding involving the intercostal musculature related to rib fractures. There is a nondisplaced fracture through the sternal manubrium. There are fractures of the posteromedial right eleventh and twelfth ribs at the costovertebral junction. Thoracic spine is assessed on dedicated reformats, reported separately. CT ABDOMEN PELVIS FINDINGS Hepatobiliary: Subcapsular low-density in the periphery of the right lobe of the liver follows the course the diaphragm and is likely rated to the diaphragmatic crus. There is no perihepatic hematoma or free fluid. Small low-density lesions in the liver are too small to characterize but likely cysts. Gallbladder is unremarkable. Pancreas: Fatty atrophy of the pancreas without evidence of injury. No ductal dilatation or inflammation. Spleen: No splenic injury or perisplenic hematoma. Adrenals/Urinary Tract: No adrenal hemorrhage or renal injury identified. Tiny subcapsular hypodensities in the right kidney are too small to characterize but likely cysts. Bladder is unremarkable. Stomach/Bowel: Small hiatal hernia. Ingested material within the stomach without evidence of gastric injury. There is no evidence of bowel or mesenteric injury. No bowel wall thickening or inflammation. No mesenteric hematoma. No free air. Moderate volume of colonic stool. Minor distal colonic diverticulosis. Vascular/Lymphatic: No aortic injury. No retroperitoneal fluid. Aortic atherosclerosis. IVC appears intact. Few prominent bilateral external iliac nodes are nonspecific. Reproductive: Prostate is unremarkable. Other: No free air. No free fluid. No confluent body wall contusion. Musculoskeletal: Lumbar spine is better assessed on dedicated lumbar reformats. There is no acute pelvic fracture. IMPRESSION: 1. Displaced left scapular fracture involving the body and base of the acromium. No definite extension to the glenohumeral joint. 2. Left anterior first through fourth rib fractures, second  rib fracture at the costochondral junction. No pneumothorax. 3. Nondisplaced sternal fracture. 4. Fractures of the posteromedial right eleventh and twelfth ribs at the costovertebral junction. 5. No additional acute traumatic injury to the chest, abdomen, or pelvis. 6. Thoracic and lumbar spine assessed on separately reported spine reformats. Aortic Atherosclerosis (ICD10-I70.0). Electronically Signed   By: Keith Rake M.D.   On: 11/22/2019 18:06   CT CERVICAL SPINE WO CONTRAST  Result Date: 11/22/2019 CLINICAL DATA:  Head trauma, intracranial arterial injury suspected. Poly trauma, critical, head/cervical spine injury suspected. Additional history provided: Patient found down at the bottom of flight of stairs laceration to side of head. EXAM: CT HEAD WITHOUT CONTRAST CT CERVICAL SPINE WITHOUT CONTRAST TECHNIQUE: Multidetector CT imaging of the head and cervical spine was performed following the standard protocol without intravenous contrast. Multiplanar CT image reconstructions of the cervical spine were also generated. COMPARISON:  Head CT 09/30/2017. FINDINGS: CT HEAD FINDING: Brain: There is a hemorrhagic contusion within the inferolateral aspect of the right temporal lobe with overlying small volume acute subarachnoid hemorrhage (series 1, image 11) (series 5, images 39-48). Small volume acute subarachnoid hemorrhage is also present along the undersurface of the anterior left temporal lobe (series 5, image 29) (series 6, image 47). Moderate ill-defined hypoattenuation within the cerebral white matter is nonspecific, but consistent with chronic small vessel ischemic disease. Mild generalized parenchymal atrophy. Findings are stable as compared to prior head CT 09/30/2017. No demarcated cortical infarct. No evidence of intracranial mass. No midline shift. Vascular: No hyperdense vessel.  Atherosclerotic calcifications. Skull: There is a nondisplaced fracture of  the squamous left temporal bone (for  instance as seen on series 4, images 41 and 36). There is an additional nondisplaced fracture of the left sphenoid bone (for instance as seen on series 4, images 20-24). Nondisplaced fracture of the left zygomatic arch (series 4, image 19). Additionally, there is a suspected minimally displaced fracture of the posterior wall the left maxillary sinus (series 4, image 17). Sinuses/Orbits: Visualized orbits show no acute finding. Air-fluid level within the left maxillary sinus. No significant mastoid effusion. Other: Sizable left-sided scalp hematoma. Laceration at this site with associated scalp staples and subcutaneous gas. CT CERVICAL SPINE FINDINGS Alignment: Straightening of the expected cervical lordosis. No significant spondylolisthesis. Skull base and vertebrae: The basion-dental and atlanto-dental intervals are maintained.There is an acute, mildly displaced fracture of the left C7 transverse process (series 5, image 70). No other acute fracture to the cervical spine is identified. Soft tissues and spinal canal: No prevertebral fluid or swelling. No visible canal hematoma. Disc levels: Cervical spondylosis with multilevel disc space narrowing, posterior disc osteophytes, uncovertebral and facet hypertrophy. There is degenerative fusion across the C5-C6 disc space. Upper chest: No consolidation within the imaged lung apices. Trace left apical pneumothorax. Other: There is an acute displaced fracture of the left scapula (series 8, image 67). Acute, nondisplaced fracture of the posterior left first rib. Acute, displaced fracture of the lateral left first rib (series 8, image 70). Acute, mildly displaced fractures of the left T1, T2 and T3 transverse processes. These results were called by telephone at the time of interpretation on 11/22/2019 at 4:38 pm to provider Dr. Darl Householder, who verbally acknowledged these results. IMPRESSION: CT head: 1. Hemorrhagic contusion within the inferolateral right temporal lobe with  overlying small volume acute subarachnoid hemorrhage. 2. Additional small volume acute subarachnoid hemorrhage along the undersurface of the anterior left temporal lobe. 3. Nondisplaced fractures of the squamous left temporal bone and of the left sphenoid bone. Additional nondisplaced fracture of the left zygomatic arch. A minimally displaced fracture of the posterior wall the left maxillary sinus is also suspected. Maxillofacial CT is recommended for further evaluation. 4. Sizable left scalp hematoma. Associated laceration at this site with scalp staples and subcutaneous gas. 5. Moderate chronic small vessel ischemic disease. 6. Mild generalized parenchymal atrophy. 7. Left sphenoid sinus air-fluid level, possibly reflecting blood. CT cervical spine: 1. Mildly displaced fracture of the left C7 transverse process. 2. Displaced fracture of the left scapula. 3. Displaced fracture of the posterior and lateral left first rib. 4. Mildly displaced fractures of the left T1, T2 and T3 transverse processes. 5. Trace left apical pneumothorax. 6. CT of the thorax is recommended for further evaluation. 7. Cervical spondylosis as described. Degenerative fusion across the C5-C6 disc space. Electronically Signed   By: Kellie Simmering DO   On: 11/22/2019 16:39   CT ABDOMEN PELVIS W CONTRAST  Result Date: 11/22/2019 CLINICAL DATA:  Trauma. Found bottom of flight of stairs. EXAM: CT CHEST, ABDOMEN, AND PELVIS WITH CONTRAST TECHNIQUE: Multidetector CT imaging of the chest, abdomen and pelvis was performed following the standard protocol during bolus administration of intravenous contrast. CONTRAST:  148mL OMNIPAQUE IOHEXOL 300 MG/ML  SOLN COMPARISON:  None. FINDINGS: CT CHEST FINDINGS Cardiovascular: Aortic atherosclerosis without evidence of acute injury. Heart is normal in size. No pericardial effusion. There are coronary artery calcifications. Mediastinum/Nodes: No mediastinal hemorrhage or hematoma. No pneumomediastinum. No  adenopathy. Decompressed esophagus with tiny hiatal hernia. Lungs/Pleura: No pneumothorax. No pulmonary contusion. There is left pleural thickening without  significant effusion. Subpleural reticulation in the periphery of the lungs, basilar predominant. Anterior left upper lobe, subpleural reticulation is likely posttraumatic related to rib fractures. Trachea and mainstem bronchi are patent. Musculoskeletal: Displaced left scapular fracture involving the body and base of the acromium, best appreciated on reformatted views. There is associated soft tissue edema/hemorrhage. No definite extension to the glenohumeral joint. Fracture of the left anterior first rib which is comminuted. Fracture of the left anterior second rib at the costochondral junction. Nondisplaced left anterior third and fourth rib fractures. Heterogeneous stranding involving the intercostal musculature related to rib fractures. There is a nondisplaced fracture through the sternal manubrium. There are fractures of the posteromedial right eleventh and twelfth ribs at the costovertebral junction. Thoracic spine is assessed on dedicated reformats, reported separately. CT ABDOMEN PELVIS FINDINGS Hepatobiliary: Subcapsular low-density in the periphery of the right lobe of the liver follows the course the diaphragm and is likely rated to the diaphragmatic crus. There is no perihepatic hematoma or free fluid. Small low-density lesions in the liver are too small to characterize but likely cysts. Gallbladder is unremarkable. Pancreas: Fatty atrophy of the pancreas without evidence of injury. No ductal dilatation or inflammation. Spleen: No splenic injury or perisplenic hematoma. Adrenals/Urinary Tract: No adrenal hemorrhage or renal injury identified. Tiny subcapsular hypodensities in the right kidney are too small to characterize but likely cysts. Bladder is unremarkable. Stomach/Bowel: Small hiatal hernia. Ingested material within the stomach without  evidence of gastric injury. There is no evidence of bowel or mesenteric injury. No bowel wall thickening or inflammation. No mesenteric hematoma. No free air. Moderate volume of colonic stool. Minor distal colonic diverticulosis. Vascular/Lymphatic: No aortic injury. No retroperitoneal fluid. Aortic atherosclerosis. IVC appears intact. Few prominent bilateral external iliac nodes are nonspecific. Reproductive: Prostate is unremarkable. Other: No free air. No free fluid. No confluent body wall contusion. Musculoskeletal: Lumbar spine is better assessed on dedicated lumbar reformats. There is no acute pelvic fracture. IMPRESSION: 1. Displaced left scapular fracture involving the body and base of the acromium. No definite extension to the glenohumeral joint. 2. Left anterior first through fourth rib fractures, second rib fracture at the costochondral junction. No pneumothorax. 3. Nondisplaced sternal fracture. 4. Fractures of the posteromedial right eleventh and twelfth ribs at the costovertebral junction. 5. No additional acute traumatic injury to the chest, abdomen, or pelvis. 6. Thoracic and lumbar spine assessed on separately reported spine reformats. Aortic Atherosclerosis (ICD10-I70.0). Electronically Signed   By: Keith Rake M.D.   On: 11/22/2019 18:06   CT T-SPINE NO CHARGE  Result Date: 11/22/2019 CLINICAL DATA:  Trauma, found bottom of flight of stairs. EXAM: CT THORACIC SPINE WITHOUT CONTRAST TECHNIQUE: Multidetector CT images of the thoracic were obtained using the standard protocol without intravenous contrast. COMPARISON:  Chest radiograph 09/30/2017. No dedicated thoracic spine imaging. Lumbar radiographs 12/01/2017 FINDINGS: Alignment: Normal. Vertebrae: Mild T12 superior endplate compression fracture, new/progressed from 2019 radiograph. Prominent Schmorl's node superior endplate of T6 with minimal adjacent loss of height. There are left transverse process fractures of T1, T2, T3, T4, and T5.  Paraspinal and other soft tissues: Left paraspinal soft tissue edema. Disc levels: Osseous projection in the dorsal canal the level of T3-T4 causes mild mass effect, appears to represent a spur rather than fracture fragment. Multilevel disc space narrowing and anterior spurring. No other areas of spinal canal narrowing. IMPRESSION: 1. Mild T12 superior endplate compression fracture, new/progressed from 2019 lumbar radiograph. 2. Acute left transverse process fractures of T1, T2, T3, T4,  and T5. 3. Prominent Schmorl's node superior endplate of T6 with slight loss of height, likely chronic but technically age indeterminate. Electronically Signed   By: Keith Rake M.D.   On: 11/22/2019 18:18   CT L-SPINE NO CHARGE  Result Date: 11/22/2019 CLINICAL DATA:  Trauma, found at bottom of stairs. EXAM: CT LUMBAR SPINE WITHOUT CONTRAST TECHNIQUE: Multidetector CT imaging of the lumbar spine was performed without intravenous contrast administration. Multiplanar CT image reconstructions were also generated. COMPARISON:  Lumbar spine radiographs 12/01/2017 FINDINGS: Segmentation: 5 lumbar type vertebrae. Alignment: Degenerative retrolisthesis of L3 on L4 and L1 on L2. Vertebrae: Mild superior endplate compression fracture of T12, new/progressed from 2019. The remaining lumbar vertebral body heights are preserved. There is no involvement or extension to the posterior elements. Suspected nondisplaced fracture of right L1 transverse process. Advanced degenerative disc disease and facet hypertrophy with prominent Modic endplate changes at 624THL and L4-L5. Paraspinal and other soft tissues: No definite edema related to T12 fracture. Paraspinal musculature is unremarkable. Disc levels: Advanced multilevel degenerative disc disease and prominent facet hypertrophy. Near complete disc space loss at L4-L5 and L5-S1. Multilevel vacuum phenomenon. There is bony canal and neural foraminal stenosis at multiple levels. IMPRESSION: 1.  Mild superior endplate compression fracture of T12, new/progressed from 2019, technically age indeterminate but may be acute. 2. Suspected acute nondisplaced fracture of right L1 transverse process. 3. Advanced multilevel degenerative disc disease and facet hypertrophy throughout the lumbar spine. Electronically Signed   By: Keith Rake M.D.   On: 11/22/2019 18:11   DG CHEST PORT 1 VIEW  Result Date: 11/23/2019 CLINICAL DATA:  Follow-up trauma. EXAM: PORTABLE CHEST 1 VIEW COMPARISON:  11/19/2019 studies FINDINGS: The cardiomediastinal silhouette is unremarkable. Known LEFT rib fractures are difficult to visualize on this study. No evidence of airspace disease, consolidation, pneumothorax or pleural effusion. Mild LEFT apical pleural thickening is again noted. IMPRESSION: No new or significant change. Electronically Signed   By: Margarette Canada M.D.   On: 11/23/2019 09:10   DG Chest Port 1 View  Result Date: 11/22/2019 CLINICAL DATA:  Fall EXAM: PORTABLE CHEST 1 VIEW COMPARISON:  09/30/2017 FINDINGS: The heart size and mediastinal contours are within normal limits. Atherosclerotic calcification of the aortic knob. No focal airspace consolidation, pleural effusion, or pneumothorax. The visualized skeletal structures are unremarkable. IMPRESSION: No acute cardiopulmonary findings. Electronically Signed   By: Davina Poke D.O.   On: 11/22/2019 15:40   DG Shoulder Left Portable  Result Date: 11/22/2019 CLINICAL DATA:  Pain following fall EXAM: LEFT SHOULDER COMPARISON:  None. FINDINGS: Oblique and Y scapular images were obtained. There is a comminuted fracture of the lateral aspect of the clavicle with mild displacement of fracture fragments. No other fracture evident. There is a small chronic appearing avulsion along the inferior glenoid as well as cortical irregularity along the lateral humeral head, findings likely indicative of previous anterior shoulder dislocation. There is mild narrowing of the  glenohumeral joint. Acromioclavicular joint appears unremarkable. IMPRESSION: 1.  Acute appearing comminuted fracture lateral left clavicle. 2. 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 3.  Narrowing glenohumeral joint. Electronically Signed   By: Lowella Grip III M.D.   On: 11/22/2019 15:46   CT MAXILLOFACIAL WO CONTRAST  Result Date: 11/23/2019 CLINICAL DATA:  Facial trauma follow-up EXAM: CT HEAD WITHOUT CONTRAST CT MAXILLOFACIAL WITHOUT CONTRAST TECHNIQUE: Multidetector CT imaging of  the head and maxillofacial structures were performed using the standard protocol without intravenous contrast. Multiplanar CT image reconstructions of the maxillofacial structures were also generated. COMPARISON:  Head CT from yesterday FINDINGS: CT HEAD FINDINGS Brain: Right inferior temporal hemorrhagic contusion with no progression. The largest pocket of blood is 5 mm. There could be overlying subarachnoid blood which is also not progressed. Minimal subdural hemorrhage seen along the right tentorium. No left-sided hemorrhage seen today. No infarct or hydrocephalus. Chronic small vessel ischemia in the deep cerebral white matter Vascular: No hyperdense vessel or unexpected calcification. Skull: Limited, nondisplaced fracture through the left calvarium. Left scalp hematoma with progressive swelling. CT MAXILLOFACIAL FINDINGS Osseous: Nondepressed left zygomatic arch fracture. There is mild deformity of the posterior left maxillary sinus wall without definite acute fracture at this level. Certainly no tripod complex fracture. Mandible is intact and located Orbits: No evidence of injury Sinuses: High-density in the left sphenoid sinus could reflect hemosinus (or incidental inflammatory inspissated material) but no visible underlying fracture. Soft  tissues: Left scalp swelling and gas IMPRESSION: 1. Unchanged right inferior temporal hemorrhagic contusion. 2. Trace subdural hemorrhage is now seen along the right tentorium. 3. Left calvarial and zygomatic arch fractures, nondisplaced. Electronically Signed   By: Monte Fantasia M.D.   On: 11/23/2019 05:39    Review of Systems  HENT: Negative for ear discharge, ear pain, hearing loss and tinnitus.   Eyes: Negative for photophobia and pain.  Respiratory: Negative for cough and shortness of breath.   Cardiovascular: Negative for chest pain.  Gastrointestinal: Negative for abdominal pain, nausea and vomiting.  Genitourinary: Negative for dysuria, flank pain, frequency and urgency.  Musculoskeletal: Positive for arthralgias (Left shoulder), back pain and neck pain. Negative for myalgias.  Neurological: Negative for dizziness and headaches.  Hematological: Does not bruise/bleed easily.  Psychiatric/Behavioral: The patient is not nervous/anxious.    Blood pressure (!) 144/89, pulse 92, temperature 98.7 F (37.1 C), temperature source Oral, resp. rate (!) 22, height 5\' 11"  (1.803 m), weight 98.4 kg, SpO2 96 %. Physical Exam  Constitutional: He appears well-developed and well-nourished. No distress.  HENT:  Head: Normocephalic.  Eyes: Conjunctivae are normal. Right eye exhibits no discharge. Left eye exhibits no discharge. No scleral icterus.  Neck:  C-collar  Cardiovascular: Normal rate and regular rhythm.  Respiratory: Effort normal. No respiratory distress.  Musculoskeletal:     Comments: Left shoulder, elbow, wrist, digits- no skin wounds, mod TTP shoulder, no instability, no blocks to motion  Sens  Ax/R/M/U intact  Mot   Ax/ R/ PIN/ M/ AIN/ U intact  Rad 2+  Neurological: He is alert.  Skin: Skin is warm and dry. He is not diaphoretic.  Psychiatric: He has a normal mood and affect. His behavior is normal.    Assessment/Plan: Left clav/scap fxs -- Can be NWB in sling but should do  well with non-operative management. F/u with Dr. Doreatha Martin in 2 weeks. Other injuries including TBI, scalp lac, facial fxs, rib fxs, and spine fxs -- per trauma service    Lisette Abu, PA-C Orthopedic Surgery [phone] 11/23/2019, 9:56 AM

## 2019-11-23 NOTE — Progress Notes (Signed)
Trauma/Critical Care Follow Up Note  Subjective:    Overnight Issues:   Objective:  Vital signs for last 24 hours: Temp:  [97.6 F (36.4 C)-98.7 F (37.1 C)] 98.7 F (37.1 C) (04/30 0800) Pulse Rate:  [50-107] 92 (04/30 0800) Resp:  [13-29] 22 (04/30 0800) BP: (114-192)/(70-116) 144/89 (04/30 0800) SpO2:  [66 %-100 %] 96 % (04/30 0800) Weight:  [98.4 kg] 98.4 kg (04/29 1551)  Hemodynamic parameters for last 24 hours:    Intake/Output from previous day: 04/29 0701 - 04/30 0700 In: 2007.2 [I.V.:2007.2] Out: 630 [Urine:630]  Intake/Output this shift: Total I/O In: 75.1 [I.V.:75.1] Out: 150 [Urine:150]  Vent settings for last 24 hours:    Physical Exam:  Gen: comfortable, no distress Neuro: non-focal exam HEENT: PERRL Neck: c-collar in place CV: RRR Pulm: unlabored breathing Abd: soft, NT GU: spont voids Extr: wwp, no edema, sling in place   Results for orders placed or performed during the hospital encounter of 11/22/19 (from the past 24 hour(s))  Sample to Blood Bank     Status: None   Collection Time: 11/22/19  3:09 PM  Result Value Ref Range   Blood Bank Specimen SAMPLE AVAILABLE FOR TESTING    Sample Expiration      11/23/2019,2359 Performed at [facility], [address], [place], [place] [address]   Comprehensive metabolic panel     Status: Abnormal   Collection Time: 11/22/19  3:13 PM  Result Value Ref Range   Sodium 124 (L) 135 - 145 mmol/L   Potassium 4.8 3.5 - 5.1 mmol/L   Chloride 92 (L) 98 - 111 mmol/L   CO2 21 (L) 22 - 32 mmol/L   Glucose, Bld 165 (H) 70 - 99 mg/dL   BUN 23 8 - 23 mg/dL   Creatinine, Ser 1.11 0.61 - 1.24 mg/dL   Calcium 8.4 (L) 8.9 - 10.3 mg/dL   Total Protein 6.9 6.5 - 8.1 g/dL   Albumin 3.1 (L) 3.5 - 5.0 g/dL   AST 30 15 - 41 U/L   ALT 18 0 - 44 U/L   Alkaline Phosphatase 55 38 - 126 U/L   Total Bilirubin 0.5 0.3 - 1.2 mg/dL   GFR calc non Af Amer >60 >60 mL/min   GFR calc Af Amer >60 >60 mL/min   Anion  gap 11 5 - 15  CBC     Status: Abnormal   Collection Time: 11/22/19  3:13 PM  Result Value Ref Range   WBC 19.4 (H) 4.0 - 10.5 K/uL   RBC 3.48 (L) 4.22 - 5.81 MIL/uL   Hemoglobin 10.3 (L) 13.0 - 17.0 g/dL   HCT 31.0 (L) 39.0 - 52.0 %   MCV 89.1 80.0 - 100.0 fL   MCH 29.6 26.0 - 34.0 pg   MCHC 33.2 30.0 - 36.0 g/dL   RDW 13.0 11.5 - 15.5 %   Platelets 327 150 - 400 K/uL   nRBC 0.0 0.0 - 0.2 %  Ethanol     Status: None   Collection Time: 11/22/19  3:13 PM  Result Value Ref Range   Alcohol, Ethyl (B) <10 <10 mg/dL  Lactic acid, plasma     Status: None   Collection Time: 11/22/19  3:13 PM  Result Value Ref Range   Lactic Acid, Venous 1.4 0.5 - 1.9 mmol/L  Protime-INR     Status: None   Collection Time: 11/22/19  3:13 PM  Result Value Ref Range   Prothrombin Time 15.1 11.4 -  15.2 seconds   INR 1.2 0.8 - 1.2  I-Stat Chem 8, ED     Status: Abnormal   Collection Time: 11/22/19  3:51 PM  Result Value Ref Range   Sodium 122 (L) 135 - 145 mmol/L   Potassium 4.7 3.5 - 5.1 mmol/L   Chloride 91 (L) 98 - 111 mmol/L   BUN 25 (H) 8 - 23 mg/dL   Creatinine, Ser 1.00 0.61 - 1.24 mg/dL   Glucose, Bld 158 (H) 70 - 99 mg/dL   Calcium, Ion 1.05 (L) 1.15 - 1.40 mmol/L   TCO2 24 22 - 32 mmol/L   Hemoglobin 10.5 (L) 13.0 - 17.0 g/dL   HCT 31.0 (L) 39.0 - 52.0 %  Urinalysis, Routine w reflex microscopic     Status: Abnormal   Collection Time: 11/22/19  4:00 PM  Result Value Ref Range   Color, Urine YELLOW YELLOW   APPearance HAZY (A) CLEAR   Specific Gravity, Urine 1.017 1.005 - 1.030   pH 5.0 5.0 - 8.0   Glucose, UA NEGATIVE NEGATIVE mg/dL   Hgb urine dipstick LARGE (A) NEGATIVE   Bilirubin Urine NEGATIVE NEGATIVE   Ketones, ur NEGATIVE NEGATIVE mg/dL   Protein, ur NEGATIVE NEGATIVE mg/dL   Nitrite NEGATIVE NEGATIVE   Leukocytes,Ua NEGATIVE NEGATIVE   RBC / HPF 11-20 0 - 5 RBC/hpf   WBC, UA 0-5 0 - 5 WBC/hpf   Bacteria, UA RARE (A) NONE SEEN   Squamous Epithelial / LPF 0-5 0 - 5    Mucus PRESENT   Osmolality     Status: Abnormal   Collection Time: 11/22/19  5:55 PM  Result Value Ref Range   Osmolality 263 (L) 275 - 295 mOsm/kg  Respiratory Panel by RT PCR (Flu A&B, Covid) - Nasopharyngeal Swab     Status: None   Collection Time: 11/22/19  5:57 PM   Specimen: Nasopharyngeal Swab  Result Value Ref Range   SARS Coronavirus 2 by RT PCR NEGATIVE NEGATIVE   Influenza A by PCR NEGATIVE NEGATIVE   Influenza B by PCR NEGATIVE NEGATIVE  CBC     Status: Abnormal   Collection Time: 11/23/19  4:17 AM  Result Value Ref Range   WBC 9.6 4.0 - 10.5 K/uL   RBC 3.63 (L) 4.22 - 5.81 MIL/uL   Hemoglobin 11.1 (L) 13.0 - 17.0 g/dL   HCT 33.5 (L) 39.0 - 52.0 %   MCV 92.3 80.0 - 100.0 fL   MCH 30.6 26.0 - 34.0 pg   MCHC 33.1 30.0 - 36.0 g/dL   RDW 13.2 11.5 - 15.5 %   Platelets 280 150 - 400 K/uL   nRBC 0.0 0.0 - 0.2 %  Basic metabolic panel     Status: Abnormal   Collection Time: 11/23/19  4:17 AM  Result Value Ref Range   Sodium 125 (L) 135 - 145 mmol/L   Potassium 4.6 3.5 - 5.1 mmol/L   Chloride 95 (L) 98 - 111 mmol/L   CO2 18 (L) 22 - 32 mmol/L   Glucose, Bld 129 (H) 70 - 99 mg/dL   BUN 17 8 - 23 mg/dL   Creatinine, Ser 0.90 0.61 - 1.24 mg/dL   Calcium 8.3 (L) 8.9 - 10.3 mg/dL   GFR calc non Af Amer >60 >60 mL/min   GFR calc Af Amer >60 >60 mL/min   Anion gap 12 5 - 15  Protime-INR     Status: None   Collection Time: 11/23/19  4:17 AM  Result Value  Ref Range   Prothrombin Time 14.4 11.4 - 15.2 seconds   INR 1.2 0.8 - 1.2  MRSA PCR Screening     Status: None   Collection Time: 11/23/19  6:11 AM   Specimen: Nasal Mucosa; Nasopharyngeal  Result Value Ref Range   MRSA by PCR NEGATIVE NEGATIVE    Assessment & Plan: The plan of care was discussed with the bedside nurse for the day who is in agreement with this plan and no additional concerns were raised.   Present on Admission:  TBI (traumatic brain injury) ([place])    LOS: 1 day   Additional comments:I reviewed  the patient's new clinical lab test results.   and I reviewed the patients new imaging test results.    Fall downstairs  Left scalp laceration - repaired by EDP TBI with R IPH and L SAH - stable on repeat CT head today, start LMWH 5/1, keppra x7d for sz ppx Left zygoma, left maxillary sinus, temporal bone fracture - ENT c/s  R rib fracture 11-12, L rib fracture 1-4 with occult PTX - pulm toilet, IS C7 transverse process fracture - collar to remain due to pain on clinical exam T12 endplate fracture and [id number] TVP fracture - okay to mobilize FEN - regular diet DVT - SCDs, LMWH to start [place], MD Trauma & General Surgery Please use [email or web address] to contact on call provider  11/23/2019  *Care during the described time interval was provided by me. I have reviewed this patient's available data, including medical history, events of note, physical examination and test results as part of my evaluation.

## 2019-11-23 NOTE — Consult Note (Signed)
ENT/FACIAL TRAUMA CONSULT:  Reason for Consult: Facial fractures Referring Physician:  Trauma Service  Nathan [name] is an 74 y.o. male.   HPI: Patient admitted to [facility] trauma service for for level 2 trauma for evaluation after suffering a significant fall with multiple injuries.  Patient fell down stairs with loss of consciousness.  Patient evaluated in the emergency department and found to have multiple fractures including bilateral rib fractures, spinal fracture and left scapular fracture.  He suffered head and facial trauma with scalp laceration in the left temporal region, nondisplaced skull fracture and subdural hematoma.  Work-up including maxillofacial CT scan showed nondisplaced facial fractures.  History reviewed. No pertinent past medical history.  History reviewed. No pertinent surgical history.  No family history on file.  Social History:  has no history on file for tobacco, alcohol, and drug.  Allergies:  Allergies  Allergen Reactions  . Terazosin Other (See Comments)    unknown    Medications: I have reviewed the patient's current medications.  Results for orders placed or performed during the hospital encounter of 11/22/19 (from the past 48 hour(s))  Sample to Blood Bank     Status: None   Collection Time: 11/22/19  3:09 PM  Result Value Ref Range   Blood Bank Specimen SAMPLE AVAILABLE FOR TESTING    Sample Expiration      11/23/2019,2359 Performed at [facility], [place] [address] [place], [place] [address]   Comprehensive metabolic panel     Status: Abnormal   Collection Time: 11/22/19  3:13 PM  Result Value Ref Range   Sodium 124 (L) 135 - 145 mmol/L   Potassium 4.8 3.5 - 5.1 mmol/L   Chloride 92 (L) 98 - 111 mmol/L   CO2 21 (L) 22 - 32 mmol/L   Glucose, Bld 165 (H) 70 - 99 mg/dL    Comment: Glucose reference range applies only to samples taken after fasting for at least 8 hours.   BUN 23 8 - 23 mg/dL   Creatinine, Ser 1.11 0.61 -  1.24 mg/dL   Calcium 8.4 (L) 8.9 - 10.3 mg/dL   Total Protein 6.9 6.5 - 8.1 g/dL   Albumin 3.1 (L) 3.5 - 5.0 g/dL   AST 30 15 - 41 U/L   ALT 18 0 - 44 U/L   Alkaline Phosphatase 55 38 - 126 U/L   Total Bilirubin 0.5 0.3 - 1.2 mg/dL   GFR calc non Af Amer >60 >60 mL/min   GFR calc Af Amer >60 >60 mL/min   Anion gap 11 5 - 15    Comment: Performed at [facility], [place] [address] [place], [place] [address]  CBC     Status: Abnormal   Collection Time: 11/22/19  3:13 PM  Result Value Ref Range   WBC 19.4 (H) 4.0 - 10.5 K/uL   RBC 3.48 (L) 4.22 - 5.81 MIL/uL   Hemoglobin 10.3 (L) 13.0 - 17.0 g/dL   HCT 31.0 (L) 39.0 - 52.0 %   MCV 89.1 80.0 - 100.0 fL   MCH 29.6 26.0 - 34.0 pg   MCHC 33.2 30.0 - 36.0 g/dL   RDW 13.0 11.5 - 15.5 %   Platelets 327 150 - 400 K/uL   nRBC 0.0 0.0 - 0.2 %    Comment: Performed at [facility], [place] [address], [place],  [address]  Ethanol     Status: None   Collection Time: 11/22/19  3:13 PM  Result Value Ref Range   Alcohol, Ethyl (B) <10 <10 mg/dL    Comment: (NOTE) Lowest detectable limit for serum alcohol is 10 mg/dL. For medical purposes only. Performed at [facility], [place] [address], [place], [place] [address]   Lactic acid, plasma     Status: None   Collection Time: 11/22/19  3:13 PM  Result Value Ref Range   Lactic Acid, Venous 1.4 0.5 - 1.9 mmol/L    Comment: Performed at [place] [address] [place], [place] [address]  Protime-INR     Status: None   Collection Time: 11/22/19  3:13 PM  Result Value Ref Range   Prothrombin Time 15.1 11.4 - 15.2 seconds   INR 1.2 0.8 - 1.2    Comment: (NOTE) INR goal varies based on device and disease states. Performed at [facility], [place] [address], [place], [place] [address]   I-Stat Chem 8, ED     Status: Abnormal   Collection Time: 11/22/19  3:51 PM  Result Value Ref Range   Sodium 122 (L) 135 - 145 mmol/L   Potassium 4.7 3.5 - 5.1 mmol/L    Chloride 91 (L) 98 - 111 mmol/L   BUN 25 (H) 8 - 23 mg/dL   Creatinine, Ser 1.00 0.61 - 1.24 mg/dL   Glucose, Bld 158 (H) 70 - 99 mg/dL    Comment: Glucose reference range applies only to samples taken after fasting for at least 8 hours.   Calcium, Ion 1.05 (L) 1.15 - 1.40 mmol/L   TCO2 24 22 - 32 mmol/L   Hemoglobin 10.5 (L) 13.0 - 17.0 g/dL   HCT 31.0 (L) 39.0 - 52.0 %  Urinalysis, Routine w reflex microscopic     Status: Abnormal   Collection Time: 11/22/19  4:00 PM  Result Value Ref Range   Color, Urine YELLOW YELLOW   APPearance HAZY (A) CLEAR   Specific Gravity, Urine 1.017 1.005 - 1.030   pH 5.0 5.0 - 8.0   Glucose, UA NEGATIVE NEGATIVE mg/dL   Hgb urine dipstick LARGE (A) NEGATIVE   Bilirubin Urine NEGATIVE NEGATIVE   Ketones, ur NEGATIVE NEGATIVE mg/dL   Protein, ur NEGATIVE NEGATIVE mg/dL   Nitrite NEGATIVE NEGATIVE   Leukocytes,Ua NEGATIVE NEGATIVE   RBC / HPF 11-20 0 - 5 RBC/hpf   WBC, UA 0-5 0 - 5 WBC/hpf   Bacteria, UA RARE (A) NONE SEEN   Squamous Epithelial / LPF 0-5 0 - 5   Mucus PRESENT     Comment: Performed at [facility], [address], [place], [place] [address]  Osmolality     Status: Abnormal   Collection Time: 11/22/19  5:55 PM  Result Value Ref Range   Osmolality 263 (L) 275 - 295 mOsm/kg    Comment: Performed at [facility], [place] [address] [place], [place] [address]  Respiratory Panel by RT PCR (Flu A&B, Covid) - Nasopharyngeal Swab     Status: None   Collection Time: 11/22/19  5:57 PM   Specimen: Nasopharyngeal Swab  Result Value Ref Range   SARS Coronavirus 2 by RT PCR NEGATIVE NEGATIVE    Comment: (NOTE) SARS-CoV-2 target nucleic acids are NOT DETECTED. The SARS-CoV-2 RNA is generally detectable in upper respiratoy specimens during the acute phase of infection. The lowest concentration of SARS-CoV-2 viral copies this assay can detect is 131 copies/mL. A negative result does not preclude SARS-Cov-2 infection and should not be  used as  the sole basis for treatment or other patient management decisions. A negative result may occur with  improper specimen collection/handling, submission of specimen other than nasopharyngeal swab, presence of viral mutation(s) within the areas targeted by this assay, and inadequate number of viral copies (<131 copies/mL). A negative result must be combined with clinical observations, patient history, and epidemiological information. The expected result is Negative. Fact Sheet for Patients:  PinkCheek.be Fact Sheet for Healthcare Providers:  GravelBags.it This test is not yet ap proved or cleared by the [place] FDA and  has been authorized for detection and/or diagnosis of SARS-CoV-2 by FDA under an Emergency Use Authorization (EUA). This EUA will remain  in effect (meaning this test can be used) for the duration of the COVID-19 declaration under Section 564(b)(1) of the Act, 21 U.S.C. section 360bbb-3(b)(1), unless the authorization is terminated or revoked sooner.    Influenza A by PCR NEGATIVE NEGATIVE   Influenza B by PCR NEGATIVE NEGATIVE    Comment: (NOTE) The Xpert Xpress SARS-CoV-2/FLU/RSV assay is intended as an aid in  the diagnosis of influenza from Nasopharyngeal swab specimens and  should not be used as a sole basis for treatment. Nasal washings and  aspirates are unacceptable for Xpert Xpress SARS-CoV-2/FLU/RSV  testing. Fact Sheet for Patients: PinkCheek.be Fact Sheet for Healthcare Providers: GravelBags.it This test is not yet approved or cleared by the [place] FDA and  has been authorized for detection and/or diagnosis of SARS-CoV-2 by  FDA under an Emergency Use Authorization (EUA). This EUA will remain  in effect (meaning this test can be used) for the duration of the  Covid-19 declaration under Section 564(b)(1) of the Act, 21   U.S.C. section 360bbb-3(b)(1), unless the authorization is  terminated or revoked. Performed at [facility], [place] [address] [place], [place] [address]   CBC     Status: Abnormal   Collection Time: 11/23/19  4:17 AM  Result Value Ref Range   WBC 9.6 4.0 - 10.5 K/uL   RBC 3.63 (L) 4.22 - 5.81 MIL/uL   Hemoglobin 11.1 (L) 13.0 - 17.0 g/dL   HCT 33.5 (L) 39.0 - 52.0 %   MCV 92.3 80.0 - 100.0 fL   MCH 30.6 26.0 - 34.0 pg   MCHC 33.1 30.0 - 36.0 g/dL   RDW 13.2 11.5 - 15.5 %   Platelets 280 150 - 400 K/uL   nRBC 0.0 0.0 - 0.2 %    Comment: Performed at [facility], [place] [address], [place], [place] [id number]  Basic metabolic panel     Status: Abnormal   Collection Time: 11/23/19  4:17 AM  Result Value Ref Range   Sodium 125 (L) 135 - 145 mmol/L   Potassium 4.6 3.5 - 5.1 mmol/L   Chloride 95 (L) 98 - 111 mmol/L   CO2 18 (L) 22 - 32 mmol/L   Glucose, Bld 129 (H) 70 - 99 mg/dL    Comment: Glucose reference range applies only to samples taken after fasting for at least 8 hours.   BUN 17 8 - 23 mg/dL   Creatinine, Ser 0.90 0.61 - 1.24 mg/dL   Calcium 8.3 (L) 8.9 - 10.3 mg/dL   GFR calc non Af Amer >60 >60 mL/min   GFR calc Af Amer >60 >60 mL/min   Anion gap 12 5 - 15    Comment: Performed at [facility] [address], [place], [place] [address]  Protime-INR     Status:  None   Collection Time: 11/23/19  4:17 AM  Result Value Ref Range   Prothrombin Time 14.4 11.4 - 15.2 seconds   INR 1.2 0.8 - 1.2    Comment: (NOTE) INR goal varies based on device and disease states. Performed at [facility], [place] [address] [place], [place] [address]   MRSA PCR Screening     Status: None   Collection Time: 11/23/19  6:11 AM   Specimen: Nasal Mucosa; Nasopharyngeal  Result Value Ref Range   MRSA by PCR NEGATIVE NEGATIVE    Comment:        The GeneXpert MRSA Assay (FDA approved for NASAL specimens only), is one component of a comprehensive MRSA  colonization surveillance program. It is not intended to diagnose MRSA infection nor to guide or monitor treatment for MRSA infections. Performed at [facility], [place] [address] [place], [place] [address]     CT HEAD WO CONTRAST  Result Date: 11/23/2019 CLINICAL DATA:  Facial trauma follow-up EXAM: CT HEAD WITHOUT CONTRAST CT MAXILLOFACIAL WITHOUT CONTRAST TECHNIQUE: Multidetector CT imaging of the head and maxillofacial structures were performed using the standard protocol without intravenous contrast. Multiplanar CT image reconstructions of the maxillofacial structures were also generated. COMPARISON:  Head CT from yesterday FINDINGS: CT HEAD FINDINGS Brain: Right inferior temporal hemorrhagic contusion with no progression. The largest pocket of blood is 5 mm. There could be overlying subarachnoid blood which is also not progressed. Minimal subdural hemorrhage seen along the right tentorium. No left-sided hemorrhage seen today. No infarct or hydrocephalus. Chronic small vessel ischemia in the deep cerebral white matter Vascular: No hyperdense vessel or unexpected calcification. Skull: Limited, nondisplaced fracture through the left calvarium. Left scalp hematoma with progressive swelling. CT MAXILLOFACIAL FINDINGS Osseous: Nondepressed left zygomatic arch fracture. There is mild deformity of the posterior left maxillary sinus wall without definite acute fracture at this level. Certainly no tripod complex fracture. Mandible is intact and located Orbits: No evidence of injury Sinuses: High-density in the left sphenoid sinus could reflect hemosinus (or incidental inflammatory inspissated material) but no visible underlying fracture. Soft tissues: Left scalp swelling and gas IMPRESSION: 1. Unchanged right inferior temporal hemorrhagic contusion. 2. Trace subdural hemorrhage is now seen along the right tentorium. 3. Left calvarial and zygomatic arch fractures, nondisplaced. Electronically Signed    By: Monte Fantasia M.D.   On: 11/23/2019 05:39   CT HEAD WO CONTRAST  Result Date: 11/22/2019 CLINICAL DATA:  Head trauma, intracranial arterial injury suspected. Poly trauma, critical, head/cervical spine injury suspected. Additional history provided: Patient found down at the bottom of flight of stairs laceration to side of head. EXAM: CT HEAD WITHOUT CONTRAST CT CERVICAL SPINE WITHOUT CONTRAST TECHNIQUE: Multidetector CT imaging of the head and cervical spine was performed following the standard protocol without intravenous contrast. Multiplanar CT image reconstructions of the cervical spine were also generated. COMPARISON:  Head CT 09/30/2017. FINDINGS: CT HEAD FINDING: Brain: There is a hemorrhagic contusion within the inferolateral aspect of the right temporal lobe with overlying small volume acute subarachnoid hemorrhage (series 1, image 11) (series 5, images 39-48). Small volume acute subarachnoid hemorrhage is also present along the undersurface of the anterior left temporal lobe (series 5, image 29) (series 6, image 47). Moderate ill-defined hypoattenuation within the cerebral white matter is nonspecific, but consistent with chronic small vessel ischemic disease. Mild generalized parenchymal atrophy. Findings are stable as compared to prior head CT 09/30/2017. No demarcated cortical infarct. No evidence of intracranial mass. No  midline shift. Vascular: No hyperdense vessel.  Atherosclerotic calcifications. Skull: There is a nondisplaced fracture of the squamous left temporal bone (for instance as seen on series 4, images 41 and 36). There is an additional nondisplaced fracture of the left sphenoid bone (for instance as seen on series 4, images 20-24). Nondisplaced fracture of the left zygomatic arch (series 4, image 19). Additionally, there is a suspected minimally displaced fracture of the posterior wall the left maxillary sinus (series 4, image 17). Sinuses/Orbits: Visualized orbits show no acute  finding. Air-fluid level within the left maxillary sinus. No significant mastoid effusion. Other: Sizable left-sided scalp hematoma. Laceration at this site with associated scalp staples and subcutaneous gas. CT CERVICAL SPINE FINDINGS Alignment: Straightening of the expected cervical lordosis. No significant spondylolisthesis. Skull base and vertebrae: The basion-dental and atlanto-dental intervals are maintained.There is an acute, mildly displaced fracture of the left C7 transverse process (series 5, image 70). No other acute fracture to the cervical spine is identified. Soft tissues and spinal canal: No prevertebral fluid or swelling. No visible canal hematoma. Disc levels: Cervical spondylosis with multilevel disc space narrowing, posterior disc osteophytes, uncovertebral and facet hypertrophy. There is degenerative fusion across the C5-C6 disc space. Upper chest: No consolidation within the imaged lung apices. Trace left apical pneumothorax. Other: There is an acute displaced fracture of the left scapula (series 8, image 67). Acute, nondisplaced fracture of the posterior left first rib. Acute, displaced fracture of the lateral left first rib (series 8, image 70). Acute, mildly displaced fractures of the left T1, T2 and T3 transverse processes. These results were called by telephone at the time of interpretation on 11/22/2019 at 4:38 pm to provider Dr. Darl Householder, who verbally acknowledged these results. IMPRESSION: CT head: 1. Hemorrhagic contusion within the inferolateral right temporal lobe with overlying small volume acute subarachnoid hemorrhage. 2. Additional small volume acute subarachnoid hemorrhage along the undersurface of the anterior left temporal lobe. 3. Nondisplaced fractures of the squamous left temporal bone and of the left sphenoid bone. Additional nondisplaced fracture of the left zygomatic arch. A minimally displaced fracture of the posterior wall the left maxillary sinus is also suspected.  Maxillofacial CT is recommended for further evaluation. 4. Sizable left scalp hematoma. Associated laceration at this site with scalp staples and subcutaneous gas. 5. Moderate chronic small vessel ischemic disease. 6. Mild generalized parenchymal atrophy. 7. Left sphenoid sinus air-fluid level, possibly reflecting blood. CT cervical spine: 1. Mildly displaced fracture of the left C7 transverse process. 2. Displaced fracture of the left scapula. 3. Displaced fracture of the posterior and lateral left first rib. 4. Mildly displaced fractures of the left T1, T2 and T3 transverse processes. 5. Trace left apical pneumothorax. 6. CT of the thorax is recommended for further evaluation. 7. Cervical spondylosis as described. Degenerative fusion across the C5-C6 disc space. Electronically Signed   By: Kellie Simmering DO   On: 11/22/2019 16:39   CT Chest W Contrast  Result Date: 11/22/2019 CLINICAL DATA:  Trauma. Found bottom of flight of stairs. EXAM: CT CHEST, ABDOMEN, AND PELVIS WITH CONTRAST TECHNIQUE: Multidetector CT imaging of the chest, abdomen and pelvis was performed following the standard protocol during bolus administration of intravenous contrast. CONTRAST:  168mL OMNIPAQUE IOHEXOL 300 MG/ML  SOLN COMPARISON:  None. FINDINGS: CT CHEST FINDINGS Cardiovascular: Aortic atherosclerosis without evidence of acute injury. Heart is normal in size. No pericardial effusion. There are coronary artery calcifications. Mediastinum/Nodes: No mediastinal hemorrhage or hematoma. No pneumomediastinum. No adenopathy. Decompressed esophagus with tiny  hiatal hernia. Lungs/Pleura: No pneumothorax. No pulmonary contusion. There is left pleural thickening without significant effusion. Subpleural reticulation in the periphery of the lungs, basilar predominant. Anterior left upper lobe, subpleural reticulation is likely posttraumatic related to rib fractures. Trachea and mainstem bronchi are patent. Musculoskeletal: Displaced left  scapular fracture involving the body and base of the acromium, best appreciated on reformatted views. There is associated soft tissue edema/hemorrhage. No definite extension to the glenohumeral joint. Fracture of the left anterior first rib which is comminuted. Fracture of the left anterior second rib at the costochondral junction. Nondisplaced left anterior third and fourth rib fractures. Heterogeneous stranding involving the intercostal musculature related to rib fractures. There is a nondisplaced fracture through the sternal manubrium. There are fractures of the posteromedial right eleventh and twelfth ribs at the costovertebral junction. Thoracic spine is assessed on dedicated reformats, reported separately. CT ABDOMEN PELVIS FINDINGS Hepatobiliary: Subcapsular low-density in the periphery of the right lobe of the liver follows the course the diaphragm and is likely rated to the diaphragmatic crus. There is no perihepatic hematoma or free fluid. Small low-density lesions in the liver are too small to characterize but likely cysts. Gallbladder is unremarkable. Pancreas: Fatty atrophy of the pancreas without evidence of injury. No ductal dilatation or inflammation. Spleen: No splenic injury or perisplenic hematoma. Adrenals/Urinary Tract: No adrenal hemorrhage or renal injury identified. Tiny subcapsular hypodensities in the right kidney are too small to characterize but likely cysts. Bladder is unremarkable. Stomach/Bowel: Small hiatal hernia. Ingested material within the stomach without evidence of gastric injury. There is no evidence of bowel or mesenteric injury. No bowel wall thickening or inflammation. No mesenteric hematoma. No free air. Moderate volume of colonic stool. Minor distal colonic diverticulosis. Vascular/Lymphatic: No aortic injury. No retroperitoneal fluid. Aortic atherosclerosis. IVC appears intact. Few prominent bilateral external iliac nodes are nonspecific. Reproductive: Prostate is  unremarkable. Other: No free air. No free fluid. No confluent body wall contusion. Musculoskeletal: Lumbar spine is better assessed on dedicated lumbar reformats. There is no acute pelvic fracture. IMPRESSION: 1. Displaced left scapular fracture involving the body and base of the acromium. No definite extension to the glenohumeral joint. 2. Left anterior first through fourth rib fractures, second rib fracture at the costochondral junction. No pneumothorax. 3. Nondisplaced sternal fracture. 4. Fractures of the posteromedial right eleventh and twelfth ribs at the costovertebral junction. 5. No additional acute traumatic injury to the chest, abdomen, or pelvis. 6. Thoracic and lumbar spine assessed on separately reported spine reformats. Aortic Atherosclerosis (ICD10-I70.0). Electronically Signed   By: Keith Rake M.D.   On: 11/22/2019 18:06   CT CERVICAL SPINE WO CONTRAST  Result Date: 11/22/2019 CLINICAL DATA:  Head trauma, intracranial arterial injury suspected. Poly trauma, critical, head/cervical spine injury suspected. Additional history provided: Patient found down at the bottom of flight of stairs laceration to side of head. EXAM: CT HEAD WITHOUT CONTRAST CT CERVICAL SPINE WITHOUT CONTRAST TECHNIQUE: Multidetector CT imaging of the head and cervical spine was performed following the standard protocol without intravenous contrast. Multiplanar CT image reconstructions of the cervical spine were also generated. COMPARISON:  Head CT 09/30/2017. FINDINGS: CT HEAD FINDING: Brain: There is a hemorrhagic contusion within the inferolateral aspect of the right temporal lobe with overlying small volume acute subarachnoid hemorrhage (series 1, image 11) (series 5, images 39-48). Small volume acute subarachnoid hemorrhage is also present along the undersurface of the anterior left temporal lobe (series 5, image 29) (series 6, image 47). Moderate ill-defined hypoattenuation within the  cerebral white matter is  nonspecific, but consistent with chronic small vessel ischemic disease. Mild generalized parenchymal atrophy. Findings are stable as compared to prior head CT 09/30/2017. No demarcated cortical infarct. No evidence of intracranial mass. No midline shift. Vascular: No hyperdense vessel.  Atherosclerotic calcifications. Skull: There is a nondisplaced fracture of the squamous left temporal bone (for instance as seen on series 4, images 41 and 36). There is an additional nondisplaced fracture of the left sphenoid bone (for instance as seen on series 4, images 20-24). Nondisplaced fracture of the left zygomatic arch (series 4, image 19). Additionally, there is a suspected minimally displaced fracture of the posterior wall the left maxillary sinus (series 4, image 17). Sinuses/Orbits: Visualized orbits show no acute finding. Air-fluid level within the left maxillary sinus. No significant mastoid effusion. Other: Sizable left-sided scalp hematoma. Laceration at this site with associated scalp staples and subcutaneous gas. CT CERVICAL SPINE FINDINGS Alignment: Straightening of the expected cervical lordosis. No significant spondylolisthesis. Skull base and vertebrae: The basion-dental and atlanto-dental intervals are maintained.There is an acute, mildly displaced fracture of the left C7 transverse process (series 5, image 70). No other acute fracture to the cervical spine is identified. Soft tissues and spinal canal: No prevertebral fluid or swelling. No visible canal hematoma. Disc levels: Cervical spondylosis with multilevel disc space narrowing, posterior disc osteophytes, uncovertebral and facet hypertrophy. There is degenerative fusion across the C5-C6 disc space. Upper chest: No consolidation within the imaged lung apices. Trace left apical pneumothorax. Other: There is an acute displaced fracture of the left scapula (series 8, image 67). Acute, nondisplaced fracture of the posterior left first rib. Acute, displaced  fracture of the lateral left first rib (series 8, image 70). Acute, mildly displaced fractures of the left T1, T2 and T3 transverse processes. These results were called by telephone at the time of interpretation on 11/22/2019 at 4:38 pm to provider Dr. Darl Householder, who verbally acknowledged these results. IMPRESSION: CT head: 1. Hemorrhagic contusion within the inferolateral right temporal lobe with overlying small volume acute subarachnoid hemorrhage. 2. Additional small volume acute subarachnoid hemorrhage along the undersurface of the anterior left temporal lobe. 3. Nondisplaced fractures of the squamous left temporal bone and of the left sphenoid bone. Additional nondisplaced fracture of the left zygomatic arch. A minimally displaced fracture of the posterior wall the left maxillary sinus is also suspected. Maxillofacial CT is recommended for further evaluation. 4. Sizable left scalp hematoma. Associated laceration at this site with scalp staples and subcutaneous gas. 5. Moderate chronic small vessel ischemic disease. 6. Mild generalized parenchymal atrophy. 7. Left sphenoid sinus air-fluid level, possibly reflecting blood. CT cervical spine: 1. Mildly displaced fracture of the left C7 transverse process. 2. Displaced fracture of the left scapula. 3. Displaced fracture of the posterior and lateral left first rib. 4. Mildly displaced fractures of the left T1, T2 and T3 transverse processes. 5. Trace left apical pneumothorax. 6. CT of the thorax is recommended for further evaluation. 7. Cervical spondylosis as described. Degenerative fusion across the C5-C6 disc space. Electronically Signed   By: Kellie Simmering DO   On: 11/22/2019 16:39   CT ABDOMEN PELVIS W CONTRAST  Result Date: 11/22/2019 CLINICAL DATA:  Trauma. Found bottom of flight of stairs. EXAM: CT CHEST, ABDOMEN, AND PELVIS WITH CONTRAST TECHNIQUE: Multidetector CT imaging of the chest, abdomen and pelvis was performed following the standard protocol during  bolus administration of intravenous contrast. CONTRAST:  129mL OMNIPAQUE IOHEXOL 300 MG/ML  SOLN COMPARISON:  None.  FINDINGS: CT CHEST FINDINGS Cardiovascular: Aortic atherosclerosis without evidence of acute injury. Heart is normal in size. No pericardial effusion. There are coronary artery calcifications. Mediastinum/Nodes: No mediastinal hemorrhage or hematoma. No pneumomediastinum. No adenopathy. Decompressed esophagus with tiny hiatal hernia. Lungs/Pleura: No pneumothorax. No pulmonary contusion. There is left pleural thickening without significant effusion. Subpleural reticulation in the periphery of the lungs, basilar predominant. Anterior left upper lobe, subpleural reticulation is likely posttraumatic related to rib fractures. Trachea and mainstem bronchi are patent. Musculoskeletal: Displaced left scapular fracture involving the body and base of the acromium, best appreciated on reformatted views. There is associated soft tissue edema/hemorrhage. No definite extension to the glenohumeral joint. Fracture of the left anterior first rib which is comminuted. Fracture of the left anterior second rib at the costochondral junction. Nondisplaced left anterior third and fourth rib fractures. Heterogeneous stranding involving the intercostal musculature related to rib fractures. There is a nondisplaced fracture through the sternal manubrium. There are fractures of the posteromedial right eleventh and twelfth ribs at the costovertebral junction. Thoracic spine is assessed on dedicated reformats, reported separately. CT ABDOMEN PELVIS FINDINGS Hepatobiliary: Subcapsular low-density in the periphery of the right lobe of the liver follows the course the diaphragm and is likely rated to the diaphragmatic crus. There is no perihepatic hematoma or free fluid. Small low-density lesions in the liver are too small to characterize but likely cysts. Gallbladder is unremarkable. Pancreas: Fatty atrophy of the pancreas without  evidence of injury. No ductal dilatation or inflammation. Spleen: No splenic injury or perisplenic hematoma. Adrenals/Urinary Tract: No adrenal hemorrhage or renal injury identified. Tiny subcapsular hypodensities in the right kidney are too small to characterize but likely cysts. Bladder is unremarkable. Stomach/Bowel: Small hiatal hernia. Ingested material within the stomach without evidence of gastric injury. There is no evidence of bowel or mesenteric injury. No bowel wall thickening or inflammation. No mesenteric hematoma. No free air. Moderate volume of colonic stool. Minor distal colonic diverticulosis. Vascular/Lymphatic: No aortic injury. No retroperitoneal fluid. Aortic atherosclerosis. IVC appears intact. Few prominent bilateral external iliac nodes are nonspecific. Reproductive: Prostate is unremarkable. Other: No free air. No free fluid. No confluent body wall contusion. Musculoskeletal: Lumbar spine is better assessed on dedicated lumbar reformats. There is no acute pelvic fracture. IMPRESSION: 1. Displaced left scapular fracture involving the body and base of the acromium. No definite extension to the glenohumeral joint. 2. Left anterior first through fourth rib fractures, second rib fracture at the costochondral junction. No pneumothorax. 3. Nondisplaced sternal fracture. 4. Fractures of the posteromedial right eleventh and twelfth ribs at the costovertebral junction. 5. No additional acute traumatic injury to the chest, abdomen, or pelvis. 6. Thoracic and lumbar spine assessed on separately reported spine reformats. Aortic Atherosclerosis (ICD10-I70.0). Electronically Signed   By: Keith Rake M.D.   On: 11/22/2019 18:06   CT T-SPINE NO CHARGE  Result Date: 11/22/2019 CLINICAL DATA:  Trauma, found bottom of flight of stairs. EXAM: CT THORACIC SPINE WITHOUT CONTRAST TECHNIQUE: Multidetector CT images of the thoracic were obtained using the standard protocol without intravenous contrast.  COMPARISON:  Chest radiograph 09/30/2017. No dedicated thoracic spine imaging. Lumbar radiographs 12/01/2017 FINDINGS: Alignment: Normal. Vertebrae: Mild T12 superior endplate compression fracture, new/progressed from 2019 radiograph. Prominent Schmorl's node superior endplate of T6 with minimal adjacent loss of height. There are left transverse process fractures of T1, T2, T3, T4, and T5. Paraspinal and other soft tissues: Left paraspinal soft tissue edema. Disc levels: Osseous projection in the dorsal canal the level  of T3-T4 causes mild mass effect, appears to represent a spur rather than fracture fragment. Multilevel disc space narrowing and anterior spurring. No other areas of spinal canal narrowing. IMPRESSION: 1. Mild T12 superior endplate compression fracture, new/progressed from 2019 lumbar radiograph. 2. Acute left transverse process fractures of T1, T2, T3, T4, and T5. 3. Prominent Schmorl's node superior endplate of T6 with slight loss of height, likely chronic but technically age indeterminate. Electronically Signed   By: Keith Rake M.D.   On: 11/22/2019 18:18   CT L-SPINE NO CHARGE  Result Date: 11/22/2019 CLINICAL DATA:  Trauma, found at bottom of stairs. EXAM: CT LUMBAR SPINE WITHOUT CONTRAST TECHNIQUE: Multidetector CT imaging of the lumbar spine was performed without intravenous contrast administration. Multiplanar CT image reconstructions were also generated. COMPARISON:  Lumbar spine radiographs 12/01/2017 FINDINGS: Segmentation: 5 lumbar type vertebrae. Alignment: Degenerative retrolisthesis of L3 on L4 and L1 on L2. Vertebrae: Mild superior endplate compression fracture of T12, new/progressed from 2019. The remaining lumbar vertebral body heights are preserved. There is no involvement or extension to the posterior elements. Suspected nondisplaced fracture of right L1 transverse process. Advanced degenerative disc disease and facet hypertrophy with prominent Modic endplate changes at  624THL and L4-L5. Paraspinal and other soft tissues: No definite edema related to T12 fracture. Paraspinal musculature is unremarkable. Disc levels: Advanced multilevel degenerative disc disease and prominent facet hypertrophy. Near complete disc space loss at L4-L5 and L5-S1. Multilevel vacuum phenomenon. There is bony canal and neural foraminal stenosis at multiple levels. IMPRESSION: 1. Mild superior endplate compression fracture of T12, new/progressed from 2019, technically age indeterminate but may be acute. 2. Suspected acute nondisplaced fracture of right L1 transverse process. 3. Advanced multilevel degenerative disc disease and facet hypertrophy throughout the lumbar spine. Electronically Signed   By: Keith Rake M.D.   On: 11/22/2019 18:11   DG CHEST PORT 1 VIEW  Result Date: 11/23/2019 CLINICAL DATA:  Follow-up trauma. EXAM: PORTABLE CHEST 1 VIEW COMPARISON:  11/19/2019 studies FINDINGS: The cardiomediastinal silhouette is unremarkable. Known LEFT rib fractures are difficult to visualize on this study. No evidence of airspace disease, consolidation, pneumothorax or pleural effusion. Mild LEFT apical pleural thickening is again noted. IMPRESSION: No new or significant change. Electronically Signed   By: Margarette Canada M.D.   On: 11/23/2019 09:10   DG Chest Port 1 View  Result Date: 11/22/2019 CLINICAL DATA:  Fall EXAM: PORTABLE CHEST 1 VIEW COMPARISON:  09/30/2017 FINDINGS: The heart size and mediastinal contours are within normal limits. Atherosclerotic calcification of the aortic knob. No focal airspace consolidation, pleural effusion, or pneumothorax. The visualized skeletal structures are unremarkable. IMPRESSION: No acute cardiopulmonary findings. Electronically Signed   By: Davina Poke D.O.   On: 11/22/2019 15:40   DG Shoulder Left Portable  Result Date: 11/22/2019 CLINICAL DATA:  Pain following fall EXAM: LEFT SHOULDER COMPARISON:  None. FINDINGS: Oblique and Y scapular images were  obtained. There is a comminuted fracture of the lateral aspect of the clavicle with mild displacement of fracture fragments. No other fracture evident. There is a small chronic appearing avulsion along the inferior glenoid as well as cortical irregularity along the lateral humeral head, findings likely indicative of previous anterior shoulder dislocation. There is mild narrowing of the glenohumeral joint. Acromioclavicular joint appears unremarkable. IMPRESSION: 1.  Acute appearing comminuted fracture lateral left clavicle. 2. Cortical irregularity along the lateral humeral head with focus of ossification along the inferior glenoid, likely representing Hill-Sachs and Bankart lesions from prior anterior shoulder  dislocation or dislocations. 3.  Narrowing glenohumeral joint. Electronically Signed   By: Lowella Grip III M.D.   On: 11/22/2019 15:46   CT MAXILLOFACIAL WO CONTRAST  Result Date: 11/23/2019 CLINICAL DATA:  Facial trauma follow-up EXAM: CT HEAD WITHOUT CONTRAST CT MAXILLOFACIAL WITHOUT CONTRAST TECHNIQUE: Multidetector CT imaging of the head and maxillofacial structures were performed using the standard protocol without intravenous contrast. Multiplanar CT image reconstructions of the maxillofacial structures were also generated. COMPARISON:  Head CT from yesterday FINDINGS: CT HEAD FINDINGS Brain: Right inferior temporal hemorrhagic contusion with no progression. The largest pocket of blood is 5 mm. There could be overlying subarachnoid blood which is also not progressed. Minimal subdural hemorrhage seen along the right tentorium. No left-sided hemorrhage seen today. No infarct or hydrocephalus. Chronic small vessel ischemia in the deep cerebral white matter Vascular: No hyperdense vessel or unexpected calcification. Skull: Limited, nondisplaced fracture through the left calvarium. Left scalp hematoma with progressive swelling. CT MAXILLOFACIAL FINDINGS Osseous: Nondepressed left zygomatic arch  fracture. There is mild deformity of the posterior left maxillary sinus wall without definite acute fracture at this level. Certainly no tripod complex fracture. Mandible is intact and located Orbits: No evidence of injury Sinuses: High-density in the left sphenoid sinus could reflect hemosinus (or incidental inflammatory inspissated material) but no visible underlying fracture. Soft tissues: Left scalp swelling and gas IMPRESSION: 1. Unchanged right inferior temporal hemorrhagic contusion. 2. Trace subdural hemorrhage is now seen along the right tentorium. 3. Left calvarial and zygomatic arch fractures, nondisplaced. Electronically Signed   By: Monte Fantasia M.D.   On: 11/23/2019 05:39    ROS:ROS 12 systems reviewed and negative except as stated in HPI   Blood pressure (!) 162/83, pulse 92, temperature 98.4 F (36.9 C), temperature source Oral, resp. rate (!) 22, height 5\' 11"  (1.803 m), weight 98.4 kg, SpO2 100 %.  PHYSICAL EXAM: General appearance - alert, well appearing, and in no distress Mental status -patient drowsy but easily arousable and alert. Eyes - pupils equal and reactive, extraocular eye movements intact, no evidence of diplopia or entrapment Ears - bilateral TM's and external ear canals normal, no bleeding or discharge Nose - normal and patent, no erythema, discharge or polyps and dry nasal mucosa without bleeding.  No septal injury or hematoma. Mouth - mucous membranes moist, pharynx normal without lesions and No intraoral lacerations, bleeding or palpable fracture. Neck - supple, no significant adenopathy, patient in cervical collar.  Studies Reviewed: Maxillofacial CT scan from 12/23/2019 is reviewed in detail.  The patient has a nondisplaced left zygoma fracture and possible posterior maxillary fracture, no air-fluid level or soft tissue in the maxillary sinus.  There is an air-fluid level in the left sphenoid sinus consistent with dependent fluid/blood.  Normal temporal bone  without evidence of temporal bone fracture, mastoid or middle ear effusion.  Left temporal scalp laceration and possible nondisplaced skull fracture.  Assessment/Plan: ENT service consulted for evaluation of facial trauma.  The patient suffered a significant injury when he fell on 11/22/2019.  [facility] as a level 2 trauma for multiple injuries.  Maxillofacial CT scan shows nondisplaced left facial fractures which will not require any surgical intervention.  Patient is stable, no evidence of diplopia, bleeding or significant swelling.  He is tolerating a normal oral diet without difficulty, no trismus or pain on chewing.  He has a scalp laceration which was closed by the ER physician with staples, intact without evidence of swelling.  Monitor for additional concerns and follow-up as an outpatient if needed.  Recommend saline nasal spray to reduce nasal dryness, avoid nose blowing and further facial trauma.  Continue normal oral diet as tolerated.  Jerrell Belfast 11/23/2019, 5:22 PM

## 2019-11-23 NOTE — Evaluation (Signed)
Physical Therapy Evaluation Patient Details Name: Nathan [name] MRN: [id number] DOB: 05-Aug-1945 Today's Date: 11/23/2019   History of Present Illness  74yo M came in as a level 2 trauma after falling down stairs. Injuries include include traumatic brain injury, scalp laceration, facial fractures, bilateral rib fractures, C7 fracture, T12 fracture, and left scapular fracture. Pt with history of seizures.  Clinical Impression  Pt presents to PT with deficits in functional mobility, gait, balance, endurance, power, cognition. Pt very unsteady during ambulation and standing balance with shuffling gait and increased sway in multiple directions. Pt with one LOB requiring modA to correct, otherwise requiring minA for majority of upright activity. Pt requires cues for all mobility and demonstrates some slowed processing although reporting his cognition feels good. Pt will benefit from aggressive mobilization and continued acute PT POC to reduce falls risk and restore independence.    Follow Up Recommendations CIR;Supervision/Assistance - 24 hour    Equipment Recommendations  3in1 (PT)    Recommendations for Other Services Rehab consult     Precautions / Restrictions Precautions Precautions: Fall Required Braces or Orthoses: Sling;Cervical Brace Cervical Brace: Hard collar Restrictions Weight Bearing Restrictions: Yes LUE Weight Bearing: Non weight bearing      Mobility  Bed Mobility Overal bed mobility: Needs Assistance Bed Mobility: Supine to Sit     Supine to sit: Mod assist        Transfers Overall transfer level: Needs assistance Equipment used: 1 person hand held assist Transfers: Sit to/from Stand Sit to Stand: Mod assist(modA progressing to minA)            Ambulation/Gait Ambulation/Gait assistance: Min assist Gait Distance (Feet): 10 Feet(10' x 2) Assistive device: 1 person hand held assist;IV Pole Gait Pattern/deviations: Step-to pattern;Drifts  right/left;Staggering right;Staggering left Gait velocity: reduced Gait velocity interpretation: <1.31 ft/sec, indicative of household ambulator General Gait Details: pt with significant increase in sway, very unsteady, requires UE support to mildly improve balance, one significant LOB requiring modA to correct. Pt taking short and shuffling steps  Stairs            Wheelchair Mobility    Modified Rankin (Stroke Patients Only)       Balance Overall balance assessment: Needs assistance Sitting-balance support: No upper extremity supported;Feet supported Sitting balance-Leahy Scale: Fair Sitting balance - Comments: minG at edge of bed   Standing balance support: Single extremity supported Standing balance-Leahy Scale: Poor Standing balance comment: min-modA                             Pertinent Vitals/Pain Pain Assessment: 0-10 Pain Score: 8  Pain Location: back Pain Descriptors / Indicators: Aching Pain Intervention(s): Monitored during session    Home Living Family/patient expects to be discharged to:: Private residence Living Arrangements: Spouse/significant other Available Help at Discharge: Family;Available 24 hours/day Type of Home: House Home Access: Stairs to enter Entrance Stairs-Rails: None Entrance Stairs-Number of Steps: 3 Home Layout: Two level;Able to live on main level with bedroom/bathroom Home Equipment: Kasandra Knudsen - single point;Shower seat      Prior Function Level of Independence: Independent with assistive device(s)(utilizes cane for long distances)               Hand Dominance        Extremity/Trunk Assessment   Upper Extremity Assessment Upper Extremity Assessment: LUE deficits/detail LUE Deficits / Details: limited 2/2 mobility restrictions and WB status    Lower Extremity Assessment Lower Extremity  Assessment: LLE deficits/detail LLE Sensation: (tingling in toes, reports this as baseline)    Cervical / Trunk  Assessment Cervical / Trunk Assessment: Other exceptions Cervical / Trunk Exceptions: Hard C-collar  Communication   Communication: No difficulties  Cognition Arousal/Alertness: Awake/alert Behavior During Therapy: Flat affect Overall Cognitive Status: Within Functional Limits for tasks assessed                                        General Comments General comments (skin integrity, edema, etc.): VSS on RA, PT assists pt with hygiene tasks as bed soiled upon PT arrival    Exercises     Assessment/Plan    PT Assessment Patient needs continued PT services  PT Problem List Decreased activity tolerance;Decreased balance;Decreased mobility;Decreased cognition;Decreased knowledge of use of DME;Decreased safety awareness;Decreased knowledge of precautions;Impaired sensation;Pain       PT Treatment Interventions DME instruction;Gait training;Stair training;Functional mobility training;Therapeutic exercise;Therapeutic activities;Balance training;Neuromuscular re-education;Cognitive remediation;Patient/family education    PT Goals (Current goals can be found in the Care Plan section)  Acute Rehab PT Goals Patient Stated Goal: to return to independent mobility PT Goal Formulation: With patient Time For Goal Achievement: 12/07/19 Potential to Achieve Goals: Good    Frequency Min 5X/week   Barriers to discharge        Co-evaluation               AM-PAC PT "6 Clicks" Mobility  Outcome Measure Help needed turning from your back to your side while in a flat bed without using bedrails?: A Lot Help needed moving from lying on your back to sitting on the side of a flat bed without using bedrails?: A Lot Help needed moving to and from a bed to a chair (including a wheelchair)?: A Lot Help needed standing up from a chair using your arms (e.g., wheelchair or bedside chair)?: A Little Help needed to walk in hospital room?: A Lot Help needed climbing 3-5 steps with a  railing? : Total 6 Click Score: 12    End of Session Equipment Utilized During Treatment: Cervical collar Activity Tolerance: Patient tolerated treatment well Patient left: in chair;with call bell/phone within reach;with chair alarm set Nurse Communication: Mobility status PT Visit Diagnosis: Unsteadiness on feet (R26.81);Repeated falls (R29.6);History of falling (Z91.81)    Time: [id number] PT Time Calculation (min) (ACUTE ONLY): 29 min   Charges:   PT Evaluation $PT Eval Moderate Complexity: 1 Mod PT Treatments $Therapeutic Activity: 8-22 mins        Zenaida Niece, PT, DPT Acute Rehabilitation Pager: [phone]   Zenaida Niece 11/23/2019, 5:03 PM

## 2019-11-23 NOTE — Social Work (Signed)
CSW met with pt at bedside. CSW introduced self and explained her role. CSW completed sbirt with pt.  Pt scored a 0 on the sbirt scale. Pt denied alcohol use. Pt denied substance use. Pt did not need resources at this time.  Emeterio Reeve, Latanya Presser, [place] Social Worker [phone]

## 2019-11-24 MED ORDER — METHOCARBAMOL 500 MG PO TABS
500.0000 mg | ORAL_TABLET | Freq: Four times a day (QID) | ORAL | Status: DC
  Administered 2019-11-24 – 2019-11-30 (×25): 500 mg via ORAL
  Filled 2019-11-24 (×25): qty 1

## 2019-11-24 MED ORDER — LISINOPRIL 10 MG PO TABS
10.0000 mg | ORAL_TABLET | Freq: Every day | ORAL | Status: DC
  Administered 2019-11-24 – 2019-11-30 (×7): 10 mg via ORAL
  Filled 2019-11-24 (×7): qty 1

## 2019-11-24 MED ORDER — PROMETHAZINE HCL 25 MG/ML IJ SOLN: 12.5000 mg | INTRAMUSCULAR | Status: DC | PRN

## 2019-11-24 MED ORDER — VENLAFAXINE HCL 37.5 MG PO TABS
300.0000 mg | ORAL_TABLET | Freq: Every day | ORAL | Status: DC
  Administered 2019-11-24 – 2019-11-30 (×7): 300 mg via ORAL
  Filled 2019-11-24 (×8): qty 8

## 2019-11-24 MED ORDER — SALINE SPRAY 0.65 % NA SOLN
1.0000 | NASAL | Status: DC | PRN
  Filled 2019-11-24: qty 44

## 2019-11-24 MED ORDER — ARIPIPRAZOLE 5 MG PO TABS
7.5000 mg | ORAL_TABLET | Freq: Every day | ORAL | Status: DC
  Administered 2019-11-24 – 2019-11-30 (×7): 7.5 mg via ORAL
  Filled 2019-11-24 (×7): qty 2

## 2019-11-24 MED ORDER — METOPROLOL TARTRATE 50 MG PO TABS
50.0000 mg | ORAL_TABLET | Freq: Two times a day (BID) | ORAL | Status: DC
  Administered 2019-11-24 – 2019-11-30 (×13): 50 mg via ORAL
  Filled 2019-11-24 (×13): qty 1

## 2019-11-24 MED ORDER — GUAIFENESIN 100 MG/5ML PO SOLN
5.0000 mL | Freq: Four times a day (QID) | ORAL | Status: DC
  Administered 2019-11-24 – 2019-11-25 (×5): 100 mg via ORAL
  Filled 2019-11-24 (×5): qty 10

## 2019-11-24 MED ORDER — ONDANSETRON HCL 4 MG/2ML IJ SOLN
4.0000 mg | Freq: Four times a day (QID) | INTRAMUSCULAR | Status: DC | PRN
  Administered 2019-11-24 – 2019-11-25 (×2): 4 mg via INTRAVENOUS
  Filled 2019-11-24 (×2): qty 2

## 2019-11-24 MED ORDER — ACETAMINOPHEN 325 MG PO TABS
650.0000 mg | ORAL_TABLET | Freq: Four times a day (QID) | ORAL | Status: DC
  Administered 2019-11-24 – 2019-11-30 (×25): 650 mg via ORAL
  Filled 2019-11-24 (×24): qty 2

## 2019-11-24 MED ORDER — ONDANSETRON 4 MG PO TBDP
4.0000 mg | ORAL_TABLET | ORAL | Status: DC | PRN
  Administered 2019-11-24: 4 mg via ORAL
  Filled 2019-11-24: qty 1

## 2019-11-24 NOTE — Progress Notes (Signed)
Text message to Dr Georgette Dover that patient has vomited and continues to be nauseated after medicating him at 2319 with Zofran for possible other medication.

## 2019-11-24 NOTE — Progress Notes (Signed)
[facility] Progress Note     Subjective: CC-  Sitting up in bed eating breakfast. Vomited last night, feeling better this morning. Denies nausea or abdominal pain. BM yesterday. PT recommending CIR. Patient states that he lives at home with his wife, she will be there 24/7.  Objective: Vital signs in last 24 hours: Temp:  [98 F (36.7 C)-98.4 F (36.9 C)] 98 F (36.7 C) (05/01 0900) Pulse Rate:  [76-104] 104 (05/01 0900) Resp:  [15-21] 21 (05/01 0900) BP: (129-162)/(76-84) 148/78 (05/01 0900) SpO2:  [92 %-100 %] 95 % (05/01 0900) Last BM Date: 11/23/19  Intake/Output from previous day: 04/30 0701 - 05/01 0700 In: 125.1 [P.O.:50; I.V.:75.1] Out: 1150 [Urine:1150] Intake/Output this shift: Total I/O In: -  Out: 800 [Urine:800]  PE: Gen:  Alert, NAD, pleasant HEENT: c-collar in place. EOM's intact, pupils equal and round, scalp lac s/p repair with staples cdi Card:  RRR, no M/G/R heard, 2+ DP pulses Pulm:  CTAB, no W/R/R, rate and effort normal Abd: Soft, NT/ND, +BS Ext:  no BUE/BLE edema, calves soft and nontender Neuro: moving all 4 extremities Psych: A&Ox4  Skin: no rashes noted, warm and dry  Lab Results:  Recent Labs    11/22/19 1513 11/22/19 1513 11/22/19 1551 11/23/19 0417  WBC 19.4*  --   --  9.6  HGB 10.3*   < > 10.5* 11.1*  HCT 31.0*   < > 31.0* 33.5*  PLT 327  --   --  280   < > = values in this interval not displayed.   BMET Recent Labs    11/22/19 1513 11/22/19 1513 11/22/19 1551 11/23/19 0417  NA 124*   < > 122* 125*  K 4.8   < > 4.7 4.6  CL 92*   < > 91* 95*  CO2 21*  --   --  18*  GLUCOSE 165*   < > 158* 129*  BUN 23   < > 25* 17  CREATININE 1.11   < > 1.00 0.90  CALCIUM 8.4*  --   --  8.3*   < > = values in this interval not displayed.   PT/INR Recent Labs    11/22/19 1513 11/23/19 0417  LABPROT 15.1 14.4  INR 1.2 1.2   CMP     Component Value Date/Time   NA 125 (L) 11/23/2019 0417   K 4.6 11/23/2019 0417    CL 95 (L) 11/23/2019 0417   CO2 18 (L) 11/23/2019 0417   GLUCOSE 129 (H) 11/23/2019 0417   BUN 17 11/23/2019 0417   CREATININE 0.90 11/23/2019 0417   CALCIUM 8.3 (L) 11/23/2019 0417   PROT 6.9 11/22/2019 1513   ALBUMIN 3.1 (L) 11/22/2019 1513   AST 30 11/22/2019 1513   ALT 18 11/22/2019 1513   ALKPHOS 55 11/22/2019 1513   BILITOT 0.5 11/22/2019 1513   GFRNONAA >60 11/23/2019 0417   GFRAA >60 11/23/2019 0417   Lipase  No results found for: LIPASE     Studies/Results: CT HEAD WO CONTRAST  Result Date: 11/23/2019 CLINICAL DATA:  Facial trauma follow-up EXAM: CT HEAD WITHOUT CONTRAST CT MAXILLOFACIAL WITHOUT CONTRAST TECHNIQUE: Multidetector CT imaging of the head and maxillofacial structures were performed using the standard protocol without intravenous contrast. Multiplanar CT image reconstructions of the maxillofacial structures were also generated. COMPARISON:  Head CT from yesterday FINDINGS: CT HEAD FINDINGS Brain: Right inferior temporal hemorrhagic contusion with no progression. The largest pocket of blood is 5 mm. There could be overlying  subarachnoid blood which is also not progressed. Minimal subdural hemorrhage seen along the right tentorium. No left-sided hemorrhage seen today. No infarct or hydrocephalus. Chronic small vessel ischemia in the deep cerebral white matter Vascular: No hyperdense vessel or unexpected calcification. Skull: Limited, nondisplaced fracture through the left calvarium. Left scalp hematoma with progressive swelling. CT MAXILLOFACIAL FINDINGS Osseous: Nondepressed left zygomatic arch fracture. There is mild deformity of the posterior left maxillary sinus wall without definite acute fracture at this level. Certainly no tripod complex fracture. Mandible is intact and located Orbits: No evidence of injury Sinuses: High-density in the left sphenoid sinus could reflect hemosinus (or incidental inflammatory inspissated material) but no visible underlying fracture.  Soft tissues: Left scalp swelling and gas IMPRESSION: 1. Unchanged right inferior temporal hemorrhagic contusion. 2. Trace subdural hemorrhage is now seen along the right tentorium. 3. Left calvarial and zygomatic arch fractures, nondisplaced. Electronically Signed   By: Monte Fantasia M.D.   On: 11/23/2019 05:39   CT HEAD WO CONTRAST  Result Date: 11/22/2019 CLINICAL DATA:  Head trauma, intracranial arterial injury suspected. Poly trauma, critical, head/cervical spine injury suspected. Additional history provided: Patient found down at the bottom of flight of stairs laceration to side of head. EXAM: CT HEAD WITHOUT CONTRAST CT CERVICAL SPINE WITHOUT CONTRAST TECHNIQUE: Multidetector CT imaging of the head and cervical spine was performed following the standard protocol without intravenous contrast. Multiplanar CT image reconstructions of the cervical spine were also generated. COMPARISON:  Head CT 09/30/2017. FINDINGS: CT HEAD FINDING: Brain: There is a hemorrhagic contusion within the inferolateral aspect of the right temporal lobe with overlying small volume acute subarachnoid hemorrhage (series 1, image 11) (series 5, images 39-48). Small volume acute subarachnoid hemorrhage is also present along the undersurface of the anterior left temporal lobe (series 5, image 29) (series 6, image 47). Moderate ill-defined hypoattenuation within the cerebral white matter is nonspecific, but consistent with chronic small vessel ischemic disease. Mild generalized parenchymal atrophy. Findings are stable as compared to prior head CT 09/30/2017. No demarcated cortical infarct. No evidence of intracranial mass. No midline shift. Vascular: No hyperdense vessel.  Atherosclerotic calcifications. Skull: There is a nondisplaced fracture of the squamous left temporal bone (for instance as seen on series 4, images 41 and 36). There is an additional nondisplaced fracture of the left sphenoid bone (for instance as seen on series 4,  images 20-24). Nondisplaced fracture of the left zygomatic arch (series 4, image 19). Additionally, there is a suspected minimally displaced fracture of the posterior wall the left maxillary sinus (series 4, image 17). Sinuses/Orbits: Visualized orbits show no acute finding. Air-fluid level within the left maxillary sinus. No significant mastoid effusion. Other: Sizable left-sided scalp hematoma. Laceration at this site with associated scalp staples and subcutaneous gas. CT CERVICAL SPINE FINDINGS Alignment: Straightening of the expected cervical lordosis. No significant spondylolisthesis. Skull base and vertebrae: The basion-dental and atlanto-dental intervals are maintained.There is an acute, mildly displaced fracture of the left C7 transverse process (series 5, image 70). No other acute fracture to the cervical spine is identified. Soft tissues and spinal canal: No prevertebral fluid or swelling. No visible canal hematoma. Disc levels: Cervical spondylosis with multilevel disc space narrowing, posterior disc osteophytes, uncovertebral and facet hypertrophy. There is degenerative fusion across the C5-C6 disc space. Upper chest: No consolidation within the imaged lung apices. Trace left apical pneumothorax. Other: There is an acute displaced fracture of the left scapula (series 8, image 67). Acute, nondisplaced fracture of the posterior left first  rib. Acute, displaced fracture of the lateral left first rib (series 8, image 70). Acute, mildly displaced fractures of the left T1, T2 and T3 transverse processes. These results were called by telephone at the time of interpretation on 11/22/2019 at 4:38 pm to provider Dr. Darl Householder, who verbally acknowledged these results. IMPRESSION: CT head: 1. Hemorrhagic contusion within the inferolateral right temporal lobe with overlying small volume acute subarachnoid hemorrhage. 2. Additional small volume acute subarachnoid hemorrhage along the undersurface of the anterior left  temporal lobe. 3. Nondisplaced fractures of the squamous left temporal bone and of the left sphenoid bone. Additional nondisplaced fracture of the left zygomatic arch. A minimally displaced fracture of the posterior wall the left maxillary sinus is also suspected. Maxillofacial CT is recommended for further evaluation. 4. Sizable left scalp hematoma. Associated laceration at this site with scalp staples and subcutaneous gas. 5. Moderate chronic small vessel ischemic disease. 6. Mild generalized parenchymal atrophy. 7. Left sphenoid sinus air-fluid level, possibly reflecting blood. CT cervical spine: 1. Mildly displaced fracture of the left C7 transverse process. 2. Displaced fracture of the left scapula. 3. Displaced fracture of the posterior and lateral left first rib. 4. Mildly displaced fractures of the left T1, T2 and T3 transverse processes. 5. Trace left apical pneumothorax. 6. CT of the thorax is recommended for further evaluation. 7. Cervical spondylosis as described. Degenerative fusion across the C5-C6 disc space. Electronically Signed   By: Kellie Simmering DO   On: 11/22/2019 16:39   CT Chest W Contrast  Result Date: 11/22/2019 CLINICAL DATA:  Trauma. Found bottom of flight of stairs. EXAM: CT CHEST, ABDOMEN, AND PELVIS WITH CONTRAST TECHNIQUE: Multidetector CT imaging of the chest, abdomen and pelvis was performed following the standard protocol during bolus administration of intravenous contrast. CONTRAST:  114mL OMNIPAQUE IOHEXOL 300 MG/ML  SOLN COMPARISON:  None. FINDINGS: CT CHEST FINDINGS Cardiovascular: Aortic atherosclerosis without evidence of acute injury. Heart is normal in size. No pericardial effusion. There are coronary artery calcifications. Mediastinum/Nodes: No mediastinal hemorrhage or hematoma. No pneumomediastinum. No adenopathy. Decompressed esophagus with tiny hiatal hernia. Lungs/Pleura: No pneumothorax. No pulmonary contusion. There is left pleural thickening without significant  effusion. Subpleural reticulation in the periphery of the lungs, basilar predominant. Anterior left upper lobe, subpleural reticulation is likely posttraumatic related to rib fractures. Trachea and mainstem bronchi are patent. Musculoskeletal: Displaced left scapular fracture involving the body and base of the acromium, best appreciated on reformatted views. There is associated soft tissue edema/hemorrhage. No definite extension to the glenohumeral joint. Fracture of the left anterior first rib which is comminuted. Fracture of the left anterior second rib at the costochondral junction. Nondisplaced left anterior third and fourth rib fractures. Heterogeneous stranding involving the intercostal musculature related to rib fractures. There is a nondisplaced fracture through the sternal manubrium. There are fractures of the posteromedial right eleventh and twelfth ribs at the costovertebral junction. Thoracic spine is assessed on dedicated reformats, reported separately. CT ABDOMEN PELVIS FINDINGS Hepatobiliary: Subcapsular low-density in the periphery of the right lobe of the liver follows the course the diaphragm and is likely rated to the diaphragmatic crus. There is no perihepatic hematoma or free fluid. Small low-density lesions in the liver are too small to characterize but likely cysts. Gallbladder is unremarkable. Pancreas: Fatty atrophy of the pancreas without evidence of injury. No ductal dilatation or inflammation. Spleen: No splenic injury or perisplenic hematoma. Adrenals/Urinary Tract: No adrenal hemorrhage or renal injury identified. Tiny subcapsular hypodensities in the right kidney are too  small to characterize but likely cysts. Bladder is unremarkable. Stomach/Bowel: Small hiatal hernia. Ingested material within the stomach without evidence of gastric injury. There is no evidence of bowel or mesenteric injury. No bowel wall thickening or inflammation. No mesenteric hematoma. No free air. Moderate volume  of colonic stool. Minor distal colonic diverticulosis. Vascular/Lymphatic: No aortic injury. No retroperitoneal fluid. Aortic atherosclerosis. IVC appears intact. Few prominent bilateral external iliac nodes are nonspecific. Reproductive: Prostate is unremarkable. Other: No free air. No free fluid. No confluent body wall contusion. Musculoskeletal: Lumbar spine is better assessed on dedicated lumbar reformats. There is no acute pelvic fracture. IMPRESSION: 1. Displaced left scapular fracture involving the body and base of the acromium. No definite extension to the glenohumeral joint. 2. Left anterior first through fourth rib fractures, second rib fracture at the costochondral junction. No pneumothorax. 3. Nondisplaced sternal fracture. 4. Fractures of the posteromedial right eleventh and twelfth ribs at the costovertebral junction. 5. No additional acute traumatic injury to the chest, abdomen, or pelvis. 6. Thoracic and lumbar spine assessed on separately reported spine reformats. Aortic Atherosclerosis (ICD10-I70.0). Electronically Signed   By: Keith Rake M.D.   On: 11/22/2019 18:06   CT CERVICAL SPINE WO CONTRAST  Result Date: 11/22/2019 CLINICAL DATA:  Head trauma, intracranial arterial injury suspected. Poly trauma, critical, head/cervical spine injury suspected. Additional history provided: Patient found down at the bottom of flight of stairs laceration to side of head. EXAM: CT HEAD WITHOUT CONTRAST CT CERVICAL SPINE WITHOUT CONTRAST TECHNIQUE: Multidetector CT imaging of the head and cervical spine was performed following the standard protocol without intravenous contrast. Multiplanar CT image reconstructions of the cervical spine were also generated. COMPARISON:  Head CT 09/30/2017. FINDINGS: CT HEAD FINDING: Brain: There is a hemorrhagic contusion within the inferolateral aspect of the right temporal lobe with overlying small volume acute subarachnoid hemorrhage (series 1, image 11) (series 5,  images 39-48). Small volume acute subarachnoid hemorrhage is also present along the undersurface of the anterior left temporal lobe (series 5, image 29) (series 6, image 47). Moderate ill-defined hypoattenuation within the cerebral white matter is nonspecific, but consistent with chronic small vessel ischemic disease. Mild generalized parenchymal atrophy. Findings are stable as compared to prior head CT 09/30/2017. No demarcated cortical infarct. No evidence of intracranial mass. No midline shift. Vascular: No hyperdense vessel.  Atherosclerotic calcifications. Skull: There is a nondisplaced fracture of the squamous left temporal bone (for instance as seen on series 4, images 41 and 36). There is an additional nondisplaced fracture of the left sphenoid bone (for instance as seen on series 4, images 20-24). Nondisplaced fracture of the left zygomatic arch (series 4, image 19). Additionally, there is a suspected minimally displaced fracture of the posterior wall the left maxillary sinus (series 4, image 17). Sinuses/Orbits: Visualized orbits show no acute finding. Air-fluid level within the left maxillary sinus. No significant mastoid effusion. Other: Sizable left-sided scalp hematoma. Laceration at this site with associated scalp staples and subcutaneous gas. CT CERVICAL SPINE FINDINGS Alignment: Straightening of the expected cervical lordosis. No significant spondylolisthesis. Skull base and vertebrae: The basion-dental and atlanto-dental intervals are maintained.There is an acute, mildly displaced fracture of the left C7 transverse process (series 5, image 70). No other acute fracture to the cervical spine is identified. Soft tissues and spinal canal: No prevertebral fluid or swelling. No visible canal hematoma. Disc levels: Cervical spondylosis with multilevel disc space narrowing, posterior disc osteophytes, uncovertebral and facet hypertrophy. There is degenerative fusion across the C5-C6 disc  space. Upper  chest: No consolidation within the imaged lung apices. Trace left apical pneumothorax. Other: There is an acute displaced fracture of the left scapula (series 8, image 67). Acute, nondisplaced fracture of the posterior left first rib. Acute, displaced fracture of the lateral left first rib (series 8, image 70). Acute, mildly displaced fractures of the left T1, T2 and T3 transverse processes. These results were called by telephone at the time of interpretation on 11/22/2019 at 4:38 pm to provider Dr. Darl Householder, who verbally acknowledged these results. IMPRESSION: CT head: 1. Hemorrhagic contusion within the inferolateral right temporal lobe with overlying small volume acute subarachnoid hemorrhage. 2. Additional small volume acute subarachnoid hemorrhage along the undersurface of the anterior left temporal lobe. 3. Nondisplaced fractures of the squamous left temporal bone and of the left sphenoid bone. Additional nondisplaced fracture of the left zygomatic arch. A minimally displaced fracture of the posterior wall the left maxillary sinus is also suspected. Maxillofacial CT is recommended for further evaluation. 4. Sizable left scalp hematoma. Associated laceration at this site with scalp staples and subcutaneous gas. 5. Moderate chronic small vessel ischemic disease. 6. Mild generalized parenchymal atrophy. 7. Left sphenoid sinus air-fluid level, possibly reflecting blood. CT cervical spine: 1. Mildly displaced fracture of the left C7 transverse process. 2. Displaced fracture of the left scapula. 3. Displaced fracture of the posterior and lateral left first rib. 4. Mildly displaced fractures of the left T1, T2 and T3 transverse processes. 5. Trace left apical pneumothorax. 6. CT of the thorax is recommended for further evaluation. 7. Cervical spondylosis as described. Degenerative fusion across the C5-C6 disc space. Electronically Signed   By: Kellie Simmering DO   On: 11/22/2019 16:39   CT ABDOMEN PELVIS W  CONTRAST  Result Date: 11/22/2019 CLINICAL DATA:  Trauma. Found bottom of flight of stairs. EXAM: CT CHEST, ABDOMEN, AND PELVIS WITH CONTRAST TECHNIQUE: Multidetector CT imaging of the chest, abdomen and pelvis was performed following the standard protocol during bolus administration of intravenous contrast. CONTRAST:  181mL OMNIPAQUE IOHEXOL 300 MG/ML  SOLN COMPARISON:  None. FINDINGS: CT CHEST FINDINGS Cardiovascular: Aortic atherosclerosis without evidence of acute injury. Heart is normal in size. No pericardial effusion. There are coronary artery calcifications. Mediastinum/Nodes: No mediastinal hemorrhage or hematoma. No pneumomediastinum. No adenopathy. Decompressed esophagus with tiny hiatal hernia. Lungs/Pleura: No pneumothorax. No pulmonary contusion. There is left pleural thickening without significant effusion. Subpleural reticulation in the periphery of the lungs, basilar predominant. Anterior left upper lobe, subpleural reticulation is likely posttraumatic related to rib fractures. Trachea and mainstem bronchi are patent. Musculoskeletal: Displaced left scapular fracture involving the body and base of the acromium, best appreciated on reformatted views. There is associated soft tissue edema/hemorrhage. No definite extension to the glenohumeral joint. Fracture of the left anterior first rib which is comminuted. Fracture of the left anterior second rib at the costochondral junction. Nondisplaced left anterior third and fourth rib fractures. Heterogeneous stranding involving the intercostal musculature related to rib fractures. There is a nondisplaced fracture through the sternal manubrium. There are fractures of the posteromedial right eleventh and twelfth ribs at the costovertebral junction. Thoracic spine is assessed on dedicated reformats, reported separately. CT ABDOMEN PELVIS FINDINGS Hepatobiliary: Subcapsular low-density in the periphery of the right lobe of the liver follows the course the  diaphragm and is likely rated to the diaphragmatic crus. There is no perihepatic hematoma or free fluid. Small low-density lesions in the liver are too small to characterize but likely cysts. Gallbladder is unremarkable. Pancreas:  Fatty atrophy of the pancreas without evidence of injury. No ductal dilatation or inflammation. Spleen: No splenic injury or perisplenic hematoma. Adrenals/Urinary Tract: No adrenal hemorrhage or renal injury identified. Tiny subcapsular hypodensities in the right kidney are too small to characterize but likely cysts. Bladder is unremarkable. Stomach/Bowel: Small hiatal hernia. Ingested material within the stomach without evidence of gastric injury. There is no evidence of bowel or mesenteric injury. No bowel wall thickening or inflammation. No mesenteric hematoma. No free air. Moderate volume of colonic stool. Minor distal colonic diverticulosis. Vascular/Lymphatic: No aortic injury. No retroperitoneal fluid. Aortic atherosclerosis. IVC appears intact. Few prominent bilateral external iliac nodes are nonspecific. Reproductive: Prostate is unremarkable. Other: No free air. No free fluid. No confluent body wall contusion. Musculoskeletal: Lumbar spine is better assessed on dedicated lumbar reformats. There is no acute pelvic fracture. IMPRESSION: 1. Displaced left scapular fracture involving the body and base of the acromium. No definite extension to the glenohumeral joint. 2. Left anterior first through fourth rib fractures, second rib fracture at the costochondral junction. No pneumothorax. 3. Nondisplaced sternal fracture. 4. Fractures of the posteromedial right eleventh and twelfth ribs at the costovertebral junction. 5. No additional acute traumatic injury to the chest, abdomen, or pelvis. 6. Thoracic and lumbar spine assessed on separately reported spine reformats. Aortic Atherosclerosis (ICD10-I70.0). Electronically Signed   By: Keith Rake M.D.   On: 11/22/2019 18:06   CT  T-SPINE NO CHARGE  Result Date: 11/22/2019 CLINICAL DATA:  Trauma, found bottom of flight of stairs. EXAM: CT THORACIC SPINE WITHOUT CONTRAST TECHNIQUE: Multidetector CT images of the thoracic were obtained using the standard protocol without intravenous contrast. COMPARISON:  Chest radiograph 09/30/2017. No dedicated thoracic spine imaging. Lumbar radiographs 12/01/2017 FINDINGS: Alignment: Normal. Vertebrae: Mild T12 superior endplate compression fracture, new/progressed from 2019 radiograph. Prominent Schmorl's node superior endplate of T6 with minimal adjacent loss of height. There are left transverse process fractures of T1, T2, T3, T4, and T5. Paraspinal and other soft tissues: Left paraspinal soft tissue edema. Disc levels: Osseous projection in the dorsal canal the level of T3-T4 causes mild mass effect, appears to represent a spur rather than fracture fragment. Multilevel disc space narrowing and anterior spurring. No other areas of spinal canal narrowing. IMPRESSION: 1. Mild T12 superior endplate compression fracture, new/progressed from 2019 lumbar radiograph. 2. Acute left transverse process fractures of T1, T2, T3, T4, and T5. 3. Prominent Schmorl's node superior endplate of T6 with slight loss of height, likely chronic but technically age indeterminate. Electronically Signed   By: Keith Rake M.D.   On: 11/22/2019 18:18   CT L-SPINE NO CHARGE  Result Date: 11/22/2019 CLINICAL DATA:  Trauma, found at bottom of stairs. EXAM: CT LUMBAR SPINE WITHOUT CONTRAST TECHNIQUE: Multidetector CT imaging of the lumbar spine was performed without intravenous contrast administration. Multiplanar CT image reconstructions were also generated. COMPARISON:  Lumbar spine radiographs 12/01/2017 FINDINGS: Segmentation: 5 lumbar type vertebrae. Alignment: Degenerative retrolisthesis of L3 on L4 and L1 on L2. Vertebrae: Mild superior endplate compression fracture of T12, new/progressed from 2019. The remaining  lumbar vertebral body heights are preserved. There is no involvement or extension to the posterior elements. Suspected nondisplaced fracture of right L1 transverse process. Advanced degenerative disc disease and facet hypertrophy with prominent Modic endplate changes at [id number] and L4-L5. Paraspinal and other soft tissues: No definite edema related to T12 fracture. Paraspinal musculature is unremarkable. Disc levels: Advanced multilevel degenerative disc disease and prominent facet hypertrophy. Near complete disc space loss at  L4-L5 and L5-S1. Multilevel vacuum phenomenon. There is bony canal and neural foraminal stenosis at multiple levels. IMPRESSION: 1. Mild superior endplate compression fracture of T12, new/progressed from 2019, technically age indeterminate but may be acute. 2. Suspected acute nondisplaced fracture of right L1 transverse process. 3. Advanced multilevel degenerative disc disease and facet hypertrophy throughout the lumbar spine. Electronically Signed   By: Keith Rake M.D.   On: 11/22/2019 18:11   DG CHEST PORT 1 VIEW  Result Date: 11/23/2019 CLINICAL DATA:  Follow-up trauma. EXAM: PORTABLE CHEST 1 VIEW COMPARISON:  11/19/2019 studies FINDINGS: The cardiomediastinal silhouette is unremarkable. Known LEFT rib fractures are difficult to visualize on this study. No evidence of airspace disease, consolidation, pneumothorax or pleural effusion. Mild LEFT apical pleural thickening is again noted. IMPRESSION: No new or significant change. Electronically Signed   By: Margarette Canada M.D.   On: 11/23/2019 09:10   DG Chest Port 1 View  Result Date: 11/22/2019 CLINICAL DATA:  Fall EXAM: PORTABLE CHEST 1 VIEW COMPARISON:  09/30/2017 FINDINGS: The heart size and mediastinal contours are within normal limits. Atherosclerotic calcification of the aortic knob. No focal airspace consolidation, pleural effusion, or pneumothorax. The visualized skeletal structures are unremarkable. IMPRESSION: No acute  cardiopulmonary findings. Electronically Signed   By: Davina Poke D.O.   On: 11/22/2019 15:40   DG Shoulder Left Portable  Result Date: 11/22/2019 CLINICAL DATA:  Pain following fall EXAM: LEFT SHOULDER COMPARISON:  None. FINDINGS: Oblique and Y scapular images were obtained. There is a comminuted fracture of the lateral aspect of the clavicle with mild displacement of fracture fragments. No other fracture evident. There is a small chronic appearing avulsion along the inferior glenoid as well as cortical irregularity along the lateral humeral head, findings likely indicative of previous anterior shoulder dislocation. There is mild narrowing of the glenohumeral joint. Acromioclavicular joint appears unremarkable. IMPRESSION: 1.  Acute appearing comminuted fracture lateral left clavicle. 2. 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 3.  Narrowing glenohumeral joint. Electronically Signed   By: Lowella Grip III M.D.   On: 11/22/2019 15:46   CT MAXILLOFACIAL WO CONTRAST  Result Date: 11/23/2019 CLINICAL DATA:  Facial trauma follow-up EXAM: CT HEAD WITHOUT CONTRAST CT MAXILLOFACIAL WITHOUT CONTRAST TECHNIQUE: Multidetector CT imaging of the head and maxillofacial structures were performed using the standard protocol without intravenous contrast. Multiplanar CT image reconstructions of the maxillofacial structures were also generated. COMPARISON:  Head CT from yesterday FINDINGS: CT HEAD FINDINGS Brain: Right inferior temporal hemorrhagic contusion with no progression. The largest pocket of blood is 5 mm. There could be overlying subarachnoid blood which is also not progressed. Minimal subdural hemorrhage seen along the right tentorium. No left-sided hemorrhage seen today. No infarct or hydrocephalus. Chronic small vessel ischemia in  the deep cerebral white matter Vascular: No hyperdense vessel or unexpected calcification. Skull: Limited, nondisplaced fracture through the left calvarium. Left scalp hematoma with progressive swelling. CT MAXILLOFACIAL FINDINGS Osseous: Nondepressed left zygomatic arch fracture. There is mild deformity of the posterior left maxillary sinus wall without definite acute fracture at this level. Certainly no tripod complex fracture. Mandible is intact and located Orbits: No evidence of injury Sinuses: High-density in the left sphenoid sinus could reflect hemosinus (or incidental inflammatory inspissated material) but no visible underlying fracture. Soft tissues: Left scalp swelling and gas IMPRESSION: 1. Unchanged right inferior temporal hemorrhagic contusion. 2. Trace  subdural hemorrhage is now seen along the right tentorium. 3. Left calvarial and zygomatic arch fractures, nondisplaced. Electronically Signed   By: Monte Fantasia M.D.   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 Wilburn Cornelia, nonop, saline nasal spray PRN, avoid nose blowing R rib fracture 11-12, L rib fracture 1-4 with occult PTX - pulm toilet, IS C7 transverse process fracture - per Dr. 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 Received Kcentra. Hold eliquis for now  ID - none FEN - decrease IVF, reg diet VTE - SCDs Foley - none Follow up - trauma, NS, ENT, PCP (Dr. Jeannetta Nap at [facility] in [place])  Plan - Continue therapies. Schedule tylenol and robaxin for better pain control. Add robitussin and IS for phlegm. CIR consult.   LOS: 2 days    Wellington Hampshire, [facility] 11/24/2019, 9:59 AM Please see Amion for pager number during day hours 7:00am-4:30pm

## 2019-11-24 NOTE — Plan of Care (Signed)
  Problem: Safety: Goal: Ability to remain free from injury will improve Outcome: Progressing   

## 2019-11-24 NOTE — Progress Notes (Signed)
Inpatient Rehab Admissions Coordinator:   Met with patient at bedside to discuss potential CIR admission. Pt. Stated interest. Will pursue for potential admit next week, pending bed availability. Will call pt.'s wife to confirm support.   Clemens Catholic, [place], [place] Admissions Coordinator  [phone] ([place]) [phone] (office)

## 2019-11-24 NOTE — Evaluation (Signed)
Occupational Therapy Evaluation Patient Details Name: Nathan [name] MRN: [id number] DOB: 1946/04/25 Today's Date: 11/24/2019    History of Present Illness 74yo M came in as a level 2 trauma after falling down stairs. Injuries include i bilateral rib fractures, C7 fracture, T12 fracture, and left scapular fracture.  CT of his head showed: Hemorrahagic contusion of the inferolateral Rt temporal lobe; small volume SAH anterior Lt temporal lobe, nondisplaced frx of the temporal and sphenoid bone; fx zygomatic arch, and minimally displaced fx Lt maxillary sinus.  . Pt with history of seizures and DVT    Clinical Impression   Pt admitted with above. He demonstrates the below listed deficits and will benefit from continued OT to maximize safety and independence with BADLs.  Pt presents to OT with generalized weakness, impaired balance, decreased activity tolerance, impaired vision (saccades impaired), decreased Lt UE function, as well as cognitive deficits including memory, attention, problem solving and awareness.  He currently requires min - max A for ADLs, and min A for very short distance functional mobility.  He shuffles his feet when he walks and becomes more unsteady as he fatigues.  Recommend CIR level rehab at discharge as he would benefit most from a team who specialize in TBI to allow him to maximize safety and independence with ADLs, reduce risk of injury, and readmission.       Follow Up Recommendations  CIR;Supervision/Assistance - 24 hour    Equipment Recommendations  3 in 1 bedside commode    Recommendations for Other Services Rehab consult     Precautions / Restrictions Precautions Precautions: Fall;Cervical Precaution Comments: sling for scapular fx  Required Braces or Orthoses: Sling;Cervical Brace Cervical Brace: Hard collar Restrictions Weight Bearing Restrictions: Yes LUE Weight Bearing: Non weight bearing      Mobility Bed Mobility               General bed  mobility comments: Pt sitting up in chair   Transfers Overall transfer level: Needs assistance Equipment used: 1 person hand held assist Transfers: Sit to/from [facility] Sit to Stand: Min assist Stand pivot transfers: Min assist       General transfer comment: Pt requires assist to boost into standing, and is unsteady with transfers and gait     Balance Overall balance assessment: Needs assistance Sitting-balance support: No upper extremity supported;Feet supported Sitting balance-Leahy Scale: Fair     Standing balance support: Single extremity supported;During functional activity Standing balance-Leahy Scale: Poor Standing balance comment: Pt requires min A, progresses to mod A as he fatigues                            ADL either performed or assessed with clinical judgement   ADL Overall ADL's : Needs assistance/impaired Eating/Feeding: Modified independent;Sitting   Grooming: Wash/dry hands;Wash/dry face;Oral care;Minimal assistance;Standing   Upper Body Bathing: Moderate assistance;Sitting   Lower Body Bathing: Moderate assistance;Sit to/from stand Lower Body Bathing Details (indicate cue type and reason): difficulty accessing bil. feet  Upper Body Dressing : Maximal assistance;Sitting   Lower Body Dressing: Maximal assistance;Sit to/from stand Lower Body Dressing Details (indicate cue type and reason): difficulty accessing bil. feet  Toilet Transfer: Minimal assistance;Ambulation;Comfort height toilet   Toileting- Clothing Manipulation and Hygiene: Maximal assistance;Sit to/from stand       Functional mobility during ADLs: Minimal assistance General ADL Comments: Pt limited by pain, Lt UE, and pt demonstrates short shuffling gait which places him at  risk for falls.  Min A for balance      Vision Baseline Vision/History: Wears glasses Wears Glasses: At all times Patient Visual Report: No change from baseline Vision Assessment?:  Yes Eye Alignment: Within Functional Limits Ocular Range of Motion: Within Functional Limits Alignment/Gaze Preference: Within Defined Limits Tracking/Visual Pursuits: Able to track stimulus in all quads without difficulty Saccades: Undershoots;Additional head turns occurred during testing;Decreased speed of saccadic movement Visual Fields: No apparent deficits Additional Comments: Pt attempts to move head in his collar while performing saccades.  He stops, hesitates frequently requiring cues to continue and often undershoots      Perception Perception Perception Tested?: Yes   Praxis Praxis Praxis tested?: Within functional limits    Pertinent Vitals/Pain Pain Assessment: Faces Faces Pain Scale: Hurts even more Pain Location: ribs and generalized  Pain Descriptors / Indicators: Grimacing;Guarding Pain Intervention(s): Monitored during session     Hand Dominance Right   Extremity/Trunk Assessment Upper Extremity Assessment Upper Extremity Assessment: LUE deficits/detail LUE Deficits / Details: hand to mouth WFL, shoulder not assessed    Lower Extremity Assessment Lower Extremity Assessment: Defer to PT evaluation   Cervical / Trunk Assessment Cervical / Trunk Assessment: Other exceptions Cervical / Trunk Exceptions: Hard C-collar   Communication Communication Communication: No difficulties   Cognition Arousal/Alertness: Awake/alert Behavior During Therapy: Flat affect Overall Cognitive Status: Impaired/Different from baseline Area of Impairment: Memory;Problem solving;Awareness;Following commands                     Memory: Decreased short-term memory Following Commands: Follows multi-step commands inconsistently;Follows multi-step commands with increased time   Awareness: Emergent Problem Solving: Slow processing;Decreased initiation;Requires verbal cues General Comments: Pt is slow to process info and to inititiate tasks.  He demonstrates emergent  awareness for his physical deficits, but has poor awareness of cognitive deficits.  Short Blessed Test completed.  He scored 10/28 which is indicative of cognitive deficit.  He was able to recite the months in reverse order, but started with April instead of December, and only able to recall one component of the the memory portion    General Comments  VSS.  Upon entry, his collar was very loose.  Collar readjusted and pt instructed in correct wear     Exercises     Shoulder Instructions      Home Living Family/patient expects to be discharged to:: Private residence Living Arrangements: Spouse/significant other Available Help at Discharge: Family;Available 24 hours/day Type of Home: House Home Access: Stairs to enter [employer] of Steps: 3 Entrance Stairs-Rails: None Home Layout: Two level;Able to live on main level with bedroom/bathroom Alternate Level Stairs-Number of Steps: flight Alternate Level Stairs-Rails: Left Bathroom Shower/Tub: Walk-in shower   Bathroom Toilet: Handicapped height     Home Equipment: [employer] - single point;Shower seat          Prior Functioning/Environment Level of Independence: Independent with assistive device(s)        Comments: 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  He also was a third degree black belt jujutsu, but stopped practiing 3 years ago due to difficulty maintaining that level at his age.  He enjoys fishing         OT Problem List: Decreased strength;Decreased activity tolerance;Impaired balance (sitting and/or standing);Decreased range of motion;Impaired vision/perception;Decreased cognition;Decreased safety awareness;Decreased knowledge of use of DME or AE;Impaired UE functional use;Pain      OT Treatment/Interventions: Self-care/ADL training;DME and/or AE instruction;Therapeutic activities;Cognitive remediation/compensation    OT  Goals(Current goals can be found in the care plan section) Acute Rehab OT  Goals Patient Stated Goal: to be able to take care of self  OT Goal Formulation: With patient Time For Goal Achievement: 12/08/19 Potential to Achieve Goals: Good ADL Goals Pt Will Perform Grooming: with min guard assist;standing Pt Will Perform Upper Body Bathing: with min guard assist;sitting Pt Will Perform Lower Body Bathing: with min guard assist;sit to/from stand Pt Will Perform Upper Body Dressing: with supervision;sitting Pt Will Perform Lower Body Dressing: with min assist;sit to/from stand Pt Will Transfer to Toilet: with min guard assist;ambulating;regular height toilet;bedside commode;grab bars Pt Will Perform Toileting - Clothing Manipulation and hygiene: with min guard assist;sit to/from stand Additional ADL Goal #1: Pt will recall precautions using external cues and no assistance  OT Frequency: Min 2X/week   Barriers to D/C:            Co-evaluation              AM-PAC OT "6 Clicks" Daily Activity     Outcome Measure Help from another person eating meals?: A Little Help from another person taking care of personal grooming?: A Little Help from another person toileting, which includes using toliet, bedpan, or urinal?: A Lot Help from another person bathing (including washing, rinsing, drying)?: A Lot Help from another person to put on and taking off regular upper body clothing?: A Lot Help from another person to put on and taking off regular lower body clothing?: A Lot 6 Click Score: 14   End of Session Equipment Utilized During Treatment: Cervical collar;Gait belt Nurse Communication: Mobility status  Activity Tolerance: Patient tolerated treatment well Patient left: in chair;with call bell/phone within reach;with chair alarm set  OT Visit Diagnosis: Unsteadiness on feet (R26.81);Pain;Cognitive communication deficit (R41.841) Pain - Right/Left: Left Pain - part of body: Shoulder(ribs )                Time: [id number] OT Time Calculation (min): 29  min Charges:  OT General Charges $OT Visit: 1 Visit OT Evaluation $OT Eval Moderate Complexity: 1 Mod OT Treatments $Self Care/Home Management : 8-22 mins  Nilsa Nutting., OTR/L Acute Rehabilitation Services Pager [phone] Office [place], [place] 11/24/2019, 11:30 AM

## 2019-11-24 NOTE — Progress Notes (Signed)
Inpatient Rehab Admissions Coordinator Note:   Per therapy recommendations, pt was screened for CIR candidacy by Clemens Catholic, [place] CCC-SLP. At this time, Pt. Appears to have functional decline and is a good candidate for CIR. Will place order for rehab consult per protocol and pursue admit pending bed availability.  Please contact me with questions.   Clemens Catholic, [place], [place] Admissions Coordinator  [phone] ([place]) [phone] (office)

## 2019-11-25 DIAGNOSIS — S42032A Displaced fracture of lateral end of left clavicle, initial encounter for closed fracture: Secondary | ICD-10-CM

## 2019-11-25 DIAGNOSIS — S2249XA Multiple fractures of ribs, unspecified side, initial encounter for closed fracture: Secondary | ICD-10-CM

## 2019-11-25 DIAGNOSIS — W108XXA Fall (on) (from) other stairs and steps, initial encounter: Secondary | ICD-10-CM

## 2019-11-25 LAB — BASIC METABOLIC PANEL
Anion gap: 8 (ref 5–15)
BUN: 13 mg/dL (ref 8–23)
CO2: 23 mmol/L (ref 22–32)
Calcium: 8.6 mg/dL — ABNORMAL LOW (ref 8.9–10.3)
Chloride: 98 mmol/L (ref 98–111)
Creatinine, Ser: 0.95 mg/dL (ref 0.61–1.24)
GFR calc Af Amer: >60 mL/min (ref >60)
GFR calc non Af Amer: >60 mL/min (ref >60)
Glucose, Bld: 97 mg/dL (ref 70–99)
Potassium: 4.8 mmol/L (ref 3.5–5.1)
Sodium: 129 mmol/L — ABNORMAL LOW (ref 135–145)

## 2019-11-25 LAB — CBC
HCT: 27.1 % — ABNORMAL LOW (ref 39.0–52.0)
Hemoglobin: 9.2 g/dL — ABNORMAL LOW (ref 13.0–17.0)
MCH: 30.3 pg (ref 26.0–34.0)
MCHC: 33.9 g/dL (ref 30.0–36.0)
MCV: 89.1 fL (ref 80.0–100.0)
Platelets: 330 10*3/uL (ref 150–400)
RBC: 3.04 MIL/uL — ABNORMAL LOW (ref 4.22–5.81)
RDW: 13.2 % (ref 11.5–15.5)
WBC: 10.3 10*3/uL (ref 4.0–10.5)
nRBC: 0 % (ref 0.0–0.2)

## 2019-11-25 MED ORDER — ENSURE ENLIVE PO LIQD
237.0000 mL | Freq: Three times a day (TID) | ORAL | Status: DC
  Administered 2019-11-25 – 2019-11-30 (×16): 237 mL via ORAL

## 2019-11-25 MED ORDER — GUAIFENESIN 100 MG/5ML PO SOLN
10.0000 mL | ORAL | Status: DC
  Administered 2019-11-25 – 2019-11-30 (×30): 200 mg via ORAL
  Filled 2019-11-25: qty 20
  Filled 2019-11-25 (×28): qty 10

## 2019-11-25 MED ORDER — SODIUM CHLORIDE 1 G PO TABS
1.0000 g | ORAL_TABLET | Freq: Three times a day (TID) | ORAL | Status: DC
  Administered 2019-11-25 – 2019-11-30 (×15): 1 g via ORAL
  Filled 2019-11-25 (×15): qty 1

## 2019-11-25 MED ORDER — ENOXAPARIN SODIUM 30 MG/0.3ML ~~LOC~~ SOLN
30.0000 mg | Freq: Two times a day (BID) | SUBCUTANEOUS | Status: DC
  Administered 2019-11-25 – 2019-11-30 (×11): 30 mg via SUBCUTANEOUS
  Filled 2019-11-25 (×11): qty 0.3

## 2019-11-25 MED ORDER — IPRATROPIUM-ALBUTEROL 0.5-2.5 (3) MG/3ML IN SOLN
3.0000 mL | RESPIRATORY_TRACT | Status: DC
  Administered 2019-11-25 – 2019-11-26 (×5): 3 mL via RESPIRATORY_TRACT
  Filled 2019-11-25 (×6): qty 3

## 2019-11-25 NOTE — Plan of Care (Signed)
  Problem: Education: Goal: Knowledge of General Education information will improve Description: Including pain rating scale, medication(s)/side effects and non-pharmacologic comfort measures Outcome: Progressing   Problem: Clinical Measurements: Goal: Will remain free from infection Outcome: Progressing   Problem: Clinical Measurements: Goal: Respiratory complications will improve Outcome: Progressing   

## 2019-11-25 NOTE — Progress Notes (Addendum)
Trauma/Critical Care Follow Up Note  Subjective:    Overnight Issues:   Objective:  Vital signs for last 24 hours: Temp:  [97.5 F (36.4 C)-98.1 F (36.7 C)] 98.1 F (36.7 C) (05/02 0302) Pulse Rate:  [69-89] 69 (05/01 2312) Resp:  [21-22] 22 (05/01 2312) BP: (124-141)/(67-89) 141/77 (05/02 0302) SpO2:  [96 %-99 %] 99 % (05/01 2312)  Hemodynamic parameters for last 24 hours:    Intake/Output from previous day: 05/01 0701 - 05/02 0700 In: 3288.9 [P.O.:1140; I.V.:2148.9] Out: 1325 [Urine:1325]  Intake/Output this shift: No intake/output data recorded.  Vent settings for last 24 hours:    Physical Exam:  Gen: comfortable, no distress Neuro: non-focal exam HEENT: PERRL Neck: c-collar in place CV: RRR Pulm: unlabored breathing Abd: soft, NT GU: clear yellow urine Extr: wwp, 1+ edema at shins b/l, LUE in sling   Results for orders placed or performed during the hospital encounter of 11/22/19 (from the past 24 hour(s))  CBC     Status: Abnormal   Collection Time: 11/25/19  2:24 AM  Result Value Ref Range   WBC 10.3 4.0 - 10.5 K/uL   RBC 3.04 (L) 4.22 - 5.81 MIL/uL   Hemoglobin 9.2 (L) 13.0 - 17.0 g/dL   HCT 27.1 (L) 39.0 - 52.0 %   MCV 89.1 80.0 - 100.0 fL   MCH 30.3 26.0 - 34.0 pg   MCHC 33.9 30.0 - 36.0 g/dL   RDW 13.2 11.5 - 15.5 %   Platelets 330 150 - 400 K/uL   nRBC 0.0 0.0 - 0.2 %  Basic metabolic panel     Status: Abnormal   Collection Time: 11/25/19  2:24 AM  Result Value Ref Range   Sodium 129 (L) 135 - 145 mmol/L   Potassium 4.8 3.5 - 5.1 mmol/L   Chloride 98 98 - 111 mmol/L   CO2 23 22 - 32 mmol/L   Glucose, Bld 97 70 - 99 mg/dL   BUN 13 8 - 23 mg/dL   Creatinine, Ser 0.95 0.61 - 1.24 mg/dL   Calcium 8.6 (L) 8.9 - 10.3 mg/dL   GFR calc non Af Amer >60 >60 mL/min   GFR calc Af Amer >60 >60 mL/min   Anion gap 8 5 - 15    Assessment & Plan: The plan of care was discussed with the bedside nurse for the day, who is in agreement with this  plan and no additional concerns were raised.   Present on Admission: . TBI (traumatic brain injury) ([place])    LOS: 3 days   Additional comments:I reviewed the patient's new clinical lab test results.   and I reviewed the patients new imaging test results.    Fall downstairs  Left scalp laceration- repaired by EDP TBIwith R IPH andL SAH- stable on repeat CT head 4/30, plan to start LMWH 5/2 (48hr after stable head CT), keppra x7d for sz ppx Left zygoma, left maxillary sinus, temporal bone fracture- per Dr. Wilburn Cornelia, nonop, saline nasal spray PRN, avoid nose blowing R rib fracture 11-12,Lrib fracture 1-4 with occult PTX - pulm toilet, IS C7 transverse process fracture- per Dr. 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 Received Kcentra. Hold eliquis for now.3 Pulm congestion - d/c MIVF, increased guaifenesin dose and frequency, IS/flutter, added duonebs, may add some lasix x1 tomorrow  ID - none FEN - decrease IVF, reg diet, add ensure for poor PO intake, add 1g TID salt tabs for hyponatremia VTE - SCDs, start LMWH Foley - none Follow up - trauma, NS, ENT, PCP (Dr. Jeannetta Nap at [facility] in [place])  Plan - Continue therapies. CIR consult.   Jesusita Oka, MD Trauma & General Surgery Please use [email or web address] to contact on call provider  11/25/2019  *Care during the described time interval was provided by me. I have reviewed this patient's available data, including medical history, events of note, physical examination and test results as part of my  evaluation.

## 2019-11-25 NOTE — Progress Notes (Signed)
Pt has congested cough in upper airway.  No wheezing noted.  Flutter device will be sufficient for this pt as he needs to strengthen his cough rather than dilate the airways.  RT will continue to monitor.

## 2019-11-26 ENCOUNTER — Encounter (HOSPITAL_COMMUNITY): Payer: Self-pay | Admitting: General Surgery

## 2019-11-26 DIAGNOSIS — S069X1S Unspecified intracranial injury with loss of consciousness of 30 minutes or less, sequela: Secondary | ICD-10-CM

## 2019-11-26 DIAGNOSIS — S2243XS Multiple fractures of ribs, bilateral, sequela: Secondary | ICD-10-CM

## 2019-11-26 LAB — BASIC METABOLIC PANEL
Anion gap: 9 (ref 5–15)
BUN: 16 mg/dL (ref 8–23)
CO2: 24 mmol/L (ref 22–32)
Calcium: 8.7 mg/dL — ABNORMAL LOW (ref 8.9–10.3)
Chloride: 100 mmol/L (ref 98–111)
Creatinine, Ser: 0.97 mg/dL (ref 0.61–1.24)
GFR calc Af Amer: >60 mL/min (ref >60)
GFR calc non Af Amer: >60 mL/min (ref >60)
Glucose, Bld: 120 mg/dL — ABNORMAL HIGH (ref 70–99)
Potassium: 4.4 mmol/L (ref 3.5–5.1)
Sodium: 133 mmol/L — ABNORMAL LOW (ref 135–145)

## 2019-11-26 LAB — CBC
HCT: 30.6 % — ABNORMAL LOW (ref 39.0–52.0)
Hemoglobin: 10.1 g/dL — ABNORMAL LOW (ref 13.0–17.0)
MCH: 30.1 pg (ref 26.0–34.0)
MCHC: 33 g/dL (ref 30.0–36.0)
MCV: 91.1 fL (ref 80.0–100.0)
Platelets: 409 10*3/uL — ABNORMAL HIGH (ref 150–400)
RBC: 3.36 MIL/uL — ABNORMAL LOW (ref 4.22–5.81)
RDW: 13.3 % (ref 11.5–15.5)
WBC: 10.1 10*3/uL (ref 4.0–10.5)
nRBC: 0 % (ref 0.0–0.2)

## 2019-11-26 MED ORDER — IPRATROPIUM-ALBUTEROL 0.5-2.5 (3) MG/3ML IN SOLN: 3.0000 mL | RESPIRATORY_TRACT | Status: DC | PRN

## 2019-11-26 NOTE — Progress Notes (Signed)
Inpatient Rehab Admissions Coordinator:   Met with patient at bedside to discuss possible CIR placement. Explained that our CIR at [facility] does not have a contract with the [place] and we would have to get approval to use his BCBS Medicare secondary plan. Pt. States that he would like to use his VA benefits due to better coverage and would like to see if the [place] hospital has any IPR facilities or consider SNFs. [facility]  IPR facility in [place] does accept [employer], but Pt. Feels that it is too far away.  Clemens Catholic, [place], [place] Admissions Coordinator  [phone] ([place]) [phone] (office)

## 2019-11-26 NOTE — Progress Notes (Signed)
   Trauma/Critical Care Follow Up Note  Subjective:    Overnight Issues: NAEO Cc chest pain with coughing. Reports his breathing has improved. Ate most of his breakfast including a muffin and a yogurt. Reports a non-bloody stool yesterday. No reported SOB, nausea, vomiting, or dysuria.   Objective:  Vital signs for last 24 hours: Temp:  [98.2 F (36.8 C)-98.6 F (37 C)] 98.2 F (36.8 C) (05/03 0722) Pulse Rate:  [62-137] 80 (05/03 0722) Resp:  [12-27] 20 (05/03 0722) BP: (116-141)/(57-80) 124/67 (05/03 0722) SpO2:  [69 %-100 %] 99 % (05/03 0722)  Hemodynamic parameters for last 24 hours:    Intake/Output from previous day: 05/02 0701 - 05/03 0700 In: 840 [P.O.:840] Out: 925 [Urine:925]  Intake/Output this shift: Total I/O In: -  Out: 275 [Urine:275]  Vent settings for last 24 hours:    Physical Exam:  Gen: comfortable, no distress, sitting up in chair Neuro: non-focal exam HEENT: PERRL Neck: c-collar in place CV: RRR Pulm: unlabored breathing, some expiratory rhonchi bilaterally, no crackles no wheezes, pulled 1000 cc on IS.  Abd: soft, NT GU: clear yellow urine Extr: wwp, BLE edema, LUE in sling  No results found for this or any previous visit (from the past 24 hour(s)).  Assessment & Plan:  Present on Admission: . TBI (traumatic brain injury) ([place])    LOS: 4 days   Additional comments:I reviewed the patient's new clinical lab test results.   and I reviewed the patients new imaging test results.    Fall downstairs  Left scalp laceration- repaired by EDP TBIwith R IPH andL SAH- stable on repeat CT head 4/30, plan to start LMWH 5/2 (48hr after stable head CT), keppra x7d for sz ppx Left zygoma, left maxillary sinus, temporal bone fracture- per Dr. Wilburn Cornelia, nonop, saline nasal spray PRN, avoid nose blowing R rib fracture 11-12,Lrib fracture 1-4 with occult PTX - pulm toilet, IS C7 transverse process fracture- per Dr. 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 Received Kcentra. Will discuss timing of Eliquis resumption with MD Pulm congestion - improving, continue guaifenesin, IS/flutter,duonebs  ID - none FEN - KVO IV, reg diet, ensure,1g TID salt tabs for hyponatremia  VTE - SCDs, Lovenox for DVT PPx Foley - none Follow up - trauma, NS, ENT, PCP ([facility] Jeannetta Nap at [facility] in [place])  Plan - Continue therapies. CIR following. Follow up CBC, BMP.    Jesusita Oka, MD Trauma & General Surgery Please use [email or web address] to contact on call provider  11/26/2019  *Care during the described time interval was provided by me. I have reviewed this patient's available data, including medical history, events of note, physical examination and test results as part of my evaluation.

## 2019-11-26 NOTE — TOC Initial Note (Signed)
Transition of Care [facility]) - Initial/Assessment Note    Patient Details  Name: Nathan [name] MRN: [id number] Date of Birth: 1946/07/23  Transition of Care [facility]) CM/SW Contact:    Ella Bodo, RN Phone Number: 11/26/2019, 4:35 PM  Clinical Narrative:                 74yo M came in as a level 2 trauma after falling down stairs. Injuries include i bilateral rib fractures, C7 fracture, T12 fracture, and left scapular fracture.  CT of his head showed: Hemorrahagic contusion of the inferolateral Rt temporal lobe; small volume SAH anterior Lt temporal lobe, nondisplaced frx of the temporal and sphenoid bone; fx zygomatic arch, and minimally displaced fx Lt maxillary sinus. PTA, pt independent with assistive devices; lives at home with spouse.  PT/OT recommending CIR.  Pt has [facility] as [facility]or, which is not contracted with [facility].  Met with pt to discuss options for rehab; pt declines to go to [facility] (which is in network with [facility]) or utilize [facility].  [facility] not [facility] outside [facility] to [place].  He prefers to go to SNF for rehab in [place] area.  Explained differences between CIR and SNF, but he insists on SNF.  Initiated FL2 and faxed out for SNF bed search, per pt request.    Will follow with updates as available.   Expected Discharge Plan: [place]     Patient Goals and CMS Choice Patient states their goals for this hospitalization and ongoing recovery are:: to get better      Expected Discharge Plan and Services Expected Discharge Plan: [place]   Discharge Planning Services: CM Consult Post Acute Care Choice: [place] Living arrangements for the past 2 months: Single Family Home                                      Prior Living Arrangements/Services Living arrangements for the past 2 months: Single Family Home Lives with:: Spouse Patient language and need for interpreter  reviewed:: Yes Do you feel safe going back to the place where you live?: Yes      Need for Family Participation in Patient Care: Yes (Comment) Care giver support system in place?: Yes (comment)   Criminal Activity/Legal Involvement Pertinent to Current Situation/Hospitalization: No - Comment as needed  Activities of Daily Living Home Assistive Devices/Equipment: None ADL Screening (condition at time of admission) Patient's cognitive ability adequate to safely complete daily activities?: Yes Is the patient deaf or have difficulty hearing?: No Does the patient have difficulty seeing, even when wearing glasses/contacts?: No Does the patient have difficulty concentrating, remembering, or making decisions?: No Patient able to express need for assistance with ADLs?: No Does the patient have difficulty dressing or bathing?: No Independently performs ADLs?: No Communication: Appropriate for developmental age, Independent Dressing (OT): Independent Grooming: Independent Feeding: Independent Bathing: Independent Toileting: Independent In/Out Bed: Independent Walks in Home: Independent Does the patient have difficulty walking or climbing stairs?: No Weakness of Legs: None Weakness of Arms/Hands: Left  Permission Sought/Granted                  Emotional Assessment Appearance:: Appears stated age Attitude/Demeanor/Rapport: Engaged Affect (typically observed): Accepting Orientation: : Oriented to Self, Oriented to Place, Oriented to  Time, Oriented to Situation      Admission diagnosis:  Hyponatremia [E87.1] Rib fractures [S22.39XA] Trauma [T14.90XA] TBI (traumatic brain injury) ([place]) [D74.1O8N] Fall [W19.XXXA] Subarachnoid bleed ([place]) [I60.9] Closed fracture of temporal bone, initial encounter ([place]) [S02.19XA] Closed fracture of sternum, unspecified portion of sternum, initial encounter [S22.20XA] Closed fracture of multiple ribs of both sides, initial encounter  [S22.43XA] Closed nondisplaced fracture of left clavicle, unspecified part of clavicle, initial encounter [S42.002A] Closed fracture of left scapula, unspecified part of scapula, initial encounter [S42.102A] Patient Active Problem List   Diagnosis Date Noted  . Fall down stairs 11/25/2019  . Multiple rib fractures 11/25/2019  . Traumatic closed fracture of distal clavicle with minimal displacement, left, initial encounter 11/25/2019  . TBI (traumatic brain injury) ([place]) 11/22/2019   PCP:  Patient, No Pcp Per Pharmacy:   [facility], [address] [place] [address] Phone: [phone] Fax: [phone]     Social Determinants of Health (SDOH) Interventions    Readmission Risk Interventions No flowsheet data found.   Reinaldo Raddle, RN, BSN  Trauma/Neuro ICU Case Manager [phone]

## 2019-11-26 NOTE — Consult Note (Signed)
Physical Medicine and Rehabilitation Consult Reason for Consult: TBI Referring Physician: Trauma services   HPI: Nathan [name] is a 74 y.o. right-handed male with history of hypertension, DVT maintained on Eliquis as well as history of seizures.  Per chart review lives with spouse.  Two-level home bed and bath main level 3 steps to entry.  Independent with assistive device.  Presented 11/22/2019 after being found down at the bottom of a flight of stairs.  Cranial CT scan showed hemorrhagic contusion within the inferior lateral right temporal lobe with overlying small volume acute subarachnoid hemorrhage.  Additional small volume acute subarachnoid hemorrhage along the undersurface of the anterior left temporal lobe.  Nondisplaced fractures of the squamous left temporal bone and of the left sphenoid bone.  Additional nondisplaced fracture of the left zygomatic arch.  A minimally displaced fracture of the posterior wall of the left maxillary sinus is also suspected.  Sizable left scalp hematoma.  CT cervical spine mildly displaced fracture of the left C7 transverse process.  Displaced fracture of the left scapula.  Displaced fracture of the posterior and lateral left first rib.  Mildly displaced fracture of the left T1-T2 and T3 transverse process.  Trace left apical pneumothorax.  He was given Kcentra to reverse Eliquis.  Neurosurgery Dr. Duffy Rhody consulted in regards to [facility] advising conservative care TLSO back brace for transverse process fractures as well as cervical collar for C7 fracture.  Left clavicle fracture scapular fracture nonweightbearing no surgical intervention and sling applied.  Facial fractures follow-up ENT Dr. Wilburn Cornelia again with conservative care.  Patient's admission chemistries alcohol negative, WBC 19,400, sodium 124, glucose 165, lactic acid 1.4.  Follow-up CT of the head 11/23/2019 unchanged right inferior temporal hemorrhagic contusion trace subdural hemorrhage was seen  along the right tentorium.  Tolerating a regular diet.  Lovenox was initiated for DVT prophylaxis 11/25/2019.  Therapy evaluations completed with recommendations of physical medicine rehab consult.   Review of Systems  Constitutional: Negative for chills and fever.  HENT: Negative for hearing loss.   Eyes: Negative for blurred vision and double vision.  Respiratory: Negative for cough and shortness of breath.   Cardiovascular: Negative for chest pain and palpitations.  Gastrointestinal: Positive for constipation. Negative for heartburn, nausea and vomiting.  Genitourinary: Negative for flank pain and hematuria.  Musculoskeletal: Positive for myalgias.  Skin: Negative for rash.  Neurological: Positive for seizures.  All other systems reviewed and are negative.  Past Medical History:  Diagnosis Date  . DVT (deep venous thrombosis) ([place])   . Hypertension   . Seizures ([place])    History reviewed. No pertinent surgical history. No family history on file. Social History:  has no history on file for tobacco, alcohol, and drug. Allergies:  Allergies  Allergen Reactions  . Terazosin Other (See Comments)    unknown   Medications Prior to Admission  Medication Sig Dispense Refill  . Apixaban (ELIQUIS PO) Take 10 mg by mouth in the morning and at bedtime.    . ARIPiprazole (ABILIFY) 15 MG tablet Take 7.5 mg by mouth daily.    . fluticasone (FLONASE) 50 MCG/ACT nasal spray Place 2 sprays into both nostrils daily.    Marland Kitchen lisinopril (ZESTRIL) 10 MG tablet Take 10 mg by mouth daily.    . metoprolol tartrate (LOPRESSOR) 50 MG tablet Take 50 mg by mouth 2 (two) times daily.    . Multiple Vitamin (MULTIVITAMIN WITH MINERALS) TABS tablet Take 1 tablet by mouth daily.    Marland Kitchen  Omega-3 Fatty Acids (OMEGA 3 500 PO) Take 1,000 mg by mouth daily.    Marland Kitchen omeprazole (PRILOSEC) 10 MG capsule Take 10 mg by mouth daily.    . simvastatin (ZOCOR) 5 MG tablet Take 2.5 mg by mouth daily.    Marland Kitchen venlafaxine (EFFEXOR) 100  MG tablet Take 300 mg by mouth daily.      Home: Home Living Family/patient expects to be discharged to:: Private residence Living Arrangements: Spouse/significant other Available Help at Discharge: Family, Available 24 hours/day Type of Home: House Home Access: Stairs to enter [employer] of Steps: 3 Entrance Stairs-Rails: None Home Layout: Two level, Able to live on main level with bedroom/bathroom Alternate Level Stairs-Number of Steps: flight Alternate Level Stairs-Rails: Left Bathroom Shower/Tub: Multimedia programmer: Handicapped height Bathroom Accessibility: Yes Home Equipment: [place] - single point, Careers adviser History: Prior Function Level of Independence: Independent with assistive device(s) Comments: 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  He also was a third degree black belt jujutsu, but stopped practiing 3 years ago due to difficulty maintaining that level at his age.  He enjoys fishing  Functional Status:  Mobility: Bed Mobility Overal bed mobility: Needs Assistance Bed Mobility: Supine to Sit Supine to sit: Mod assist General bed mobility comments: Pt sitting up in chair  Transfers Overall transfer level: Needs assistance Equipment used: 1 person hand held assist Transfers: Sit to/from Stand, Stand Pivot Transfers Sit to Stand: Min assist Stand pivot transfers: Min assist General transfer comment: Pt requires assist to boost into standing, and is unsteady with transfers and gait  Ambulation/Gait Ambulation/Gait assistance: Min assist Gait Distance (Feet): 10 Feet(10' x 2) Assistive device: 1 person hand held assist, IV Pole Gait Pattern/deviations: Step-to pattern, Drifts right/left, Staggering right, Staggering left General Gait Details: pt with significant increase in sway, very unsteady, requires UE support to mildly improve balance, one significant LOB requiring modA to correct. Pt taking short and shuffling  steps Gait velocity: reduced Gait velocity interpretation: <1.31 ft/sec, indicative of household ambulator    ADL: ADL Overall ADL's : Needs assistance/impaired Eating/Feeding: Modified independent, Sitting Grooming: Wash/dry hands, Wash/dry face, Oral care, Minimal assistance, Standing Upper Body Bathing: Moderate assistance, Sitting Lower Body Bathing: Moderate assistance, Sit to/from stand Lower Body Bathing Details (indicate cue type and reason): difficulty accessing bil. feet  Upper Body Dressing : Maximal assistance, Sitting Lower Body Dressing: Maximal assistance, Sit to/from stand Lower Body Dressing Details (indicate cue type and reason): difficulty accessing bil. feet  Toilet Transfer: Minimal assistance, Ambulation, Comfort height toilet Toileting- Clothing Manipulation and Hygiene: Maximal assistance, Sit to/from stand Functional mobility during ADLs: Minimal assistance General ADL Comments: Pt limited by pain, Lt UE, and pt demonstrates short shuffling gait which places him at risk for falls.  Min A for balance   Cognition: Cognition Overall Cognitive Status: Impaired/Different from baseline Orientation Level: Oriented X4 Cognition Arousal/Alertness: Awake/alert Behavior During Therapy: Flat affect Overall Cognitive Status: Impaired/Different from baseline Area of Impairment: Memory, Problem solving, Awareness, Following commands Memory: Decreased short-term memory Following Commands: Follows multi-step commands inconsistently, Follows multi-step commands with increased time Awareness: Emergent Problem Solving: Slow processing, Decreased initiation, Requires verbal cues General Comments: Pt is slow to process info and to inititiate tasks.  He demonstrates emergent awareness for his physical deficits, but has poor awareness of cognitive deficits.  Short Blessed Test completed.  He scored 10/28 which is indicative of cognitive deficit.  He was able to recite the months in  reverse order, but started with April instead of December, and only able to recall one component of the the memory portion   Blood pressure 134/69, pulse 73, temperature 98.3 F (36.8 C), temperature source Oral, resp. rate 20, height 5\' 11"  (1.803 m), weight 98.4 kg, SpO2 99 %. Physical Exam  HENT:  Mouth/Throat: Oropharynx is clear and moist.  Eyes: Pupils are equal, round, and reactive to light. EOM are normal.  Neck:  Cervical collar in place  Cardiovascular: Normal rate.  Respiratory: Effort normal.  GI: Soft.  Musculoskeletal:     Cervical back: Normal range of motion.     Comments: Left arm in sling. Wearing TLSO, cervical collar  Neurological:  Patient is alert. Oriented to person, place, day of week, month.  Does not recall fall but remembers coming to on floor next to wife. Fair insight and awareness. Concentration lags.  Normal language  Skin: He is not diaphoretic.  Numerous abrasions on arms, legs.   Psychiatric: He has a normal mood and affect. His behavior is normal.    No results found for this or any previous visit (from the past 24 hour(s)). No results found.   Assessment/Plan: Diagnosis: TBI with multiple ortho trauma after falling down stairs 1. Does the need for close, 24 hr/day medical supervision in concert with the patient's rehab needs make it unreasonable for this patient to be served in a less intensive setting? Yes 2. Co-Morbidities requiring supervision/potential complications: pain mgt, ortho 3. Due to bladder management, bowel management, safety, skin/wound care, disease management, medication administration, pain management and patient education, does the patient require 24 hr/day rehab nursing? Yes 4. Does the patient require coordinated care of a physician, rehab nurse, therapy disciplines of PT, OT, and SLP to address physical and functional deficits in the context of the above medical diagnosis(es)? Yes Addressing deficits in the following areas:  balance, endurance, locomotion, strength, transferring, bowel/bladder control, bathing, dressing, feeding, grooming, toileting and psychosocial support , cognition 5. Can the patient actively participate in an intensive therapy program of at least 3 hrs of therapy per day at least 5 days per week? Yes 6. The potential for patient to make measurable gains while on inpatient rehab is excellent 7. Anticipated functional outcomes upon discharge from inpatient rehab are supervision  with PT, supervision and min assist with OT, modified independent with SLP. 8. Estimated rehab length of stay to reach the above functional goals is: 9-14 days 9. Anticipated discharge destination: Home 10. Overall Rehab/Functional Prognosis: excellent  RECOMMENDATIONS: This patient's condition is appropriate for continued rehabilitative care in the following setting: CIR Patient has agreed to participate in recommended program. Yes Note that insurance prior authorization may be required for reimbursement for recommended care.  Comment: Rehab Admissions Coordinator/ acute social worker to follow up re VA benefits,etc and potential location of inpatient rehab.  Thanks,  Meredith Staggers, MD, Mellody Drown  I have personally performed a face to face diagnostic evaluation of this patient. Additionally, I have examined pertinent labs and radiographic images. I have reviewed and concur with the physician assistant's documentation above.    Lavon Paganini Angiulli, PA-C 11/26/2019

## 2019-11-26 NOTE — Evaluation (Signed)
Speech Language Pathology Evaluation Patient Details Name: Nathan [name] MRN: [id number] DOB: May 25, 1946 Today's Date: 11/26/2019 Time: [id number] SLP Time Calculation (min) (ACUTE ONLY): 20 min  Problem List:  Patient Active Problem List   Diagnosis Date Noted  . Fall down stairs 11/25/2019  . Multiple rib fractures 11/25/2019  . Traumatic closed fracture of distal clavicle with minimal displacement, left, initial encounter 11/25/2019  . TBI (traumatic brain injury) ([place]) 11/22/2019   Past Medical History:  Past Medical History:  Diagnosis Date  . DVT (deep venous thrombosis) ([place])   . Hypertension   . Seizures ([place])    Past Surgical History: History reviewed. No pertinent surgical history. HPI:  74 y.o.m came in as a level 2 trauma after falling down stairs. Injuries include bilateral rib fractures, C7 fracture, T12 fracture, and left scapular fracture.  CT of his head showed: Hemorrhagic contusion of the inferolateral Rt temporal lobe; small volume SAH anterior Lt temporal lobe, nondisplaced frx of the temporal and sphenoid bone; fx zygomatic arch, and minimally displaced fx Lt maxillary sinus. Pt with history of seizures and DVT.    Assessment / Plan / Recommendation Clinical Impression  Pt presents with cognitive impairments s/p TBI marked by impaired selective and alternating attention, impaired storage and retrieval of novel verbal information (single words and paragraph level), difficulty with mental manipulation of numbers.  Clock drawing task was significantly impaired with poor spacing of numbers, poor conceptual understanding of time markers, and limited awareness of deficits.  Pt will benefit from SLP therapy to address the aforementioned deficits in the context of TBI; he will benefit from therapy at the CIR level.  Will follow while in acute care.     SLP Assessment  SLP Recommendation/Assessment: Patient needs continued Speech Lanaguage Pathology Services    Follow Up  Recommendations  Inpatient Rehab    Frequency and Duration min 2x/week  1 week      SLP Evaluation Cognition  Overall Cognitive Status: Impaired/Different from baseline Arousal/Alertness: Awake/alert Orientation Level: Oriented X4 Attention: Selective Selective Attention: Impaired Selective Attention Impairment: Verbal basic Memory: Impaired Memory Impairment: Retrieval deficit;Decreased recall of new information Awareness: Impaired Awareness Impairment: Emergent impairment Problem Solving: Impaired Problem Solving Impairment: Verbal basic Safety/Judgment: Impaired       Comprehension  Auditory Comprehension Overall Auditory Comprehension: Appears within functional limits for tasks assessed Reading Comprehension Reading Status: Not tested    Expression Expression Primary Mode of Expression: Verbal Verbal Expression Overall Verbal Expression: Appears within functional limits for tasks assessed Written Expression Dominant Hand: Right Written Expression: Not tested   Oral / Motor  Oral Motor/Sensory Function Overall Oral Motor/Sensory Function: Within functional limits Motor Speech Overall Motor Speech: Appears within functional limits for tasks assessed   GO                    Nathan [name] 11/26/2019, 11:17 AM  Estill Bamberg L. Tivis Ringer, [place] Office number [phone] Pager [phone]

## 2019-11-26 NOTE — NC FL2 (Signed)
[place] LEVEL OF CARE SCREENING TOOL     IDENTIFICATION  Patient Name: Nathan [name] Birthdate: 12/14/1945 Sex: male Admission Date (Current Location): 11/22/2019  [facility] [place] Number:  Herbalist and Address:  [facility], [place] [address], [place], [place] [address]      Provider Number: [id number]  Attending Physician Name and Address:  Md, Trauma, MD  Relative Name and Phone Number:  Mitsuo Egloff, spouse [phone]    Current Level of Care: Hospital Recommended Level of Care: Progress Prior Approval Number:    Date Approved/Denied:   PASRR Number: [id number]  Discharge Plan: SNF    Current Diagnoses: Patient Active Problem List   Diagnosis Date Noted  . Fall down stairs 11/25/2019  . Multiple rib fractures 11/25/2019  . Traumatic closed fracture of distal clavicle with minimal displacement, left, initial encounter 11/25/2019  . TBI (traumatic brain injury) ([place]) 11/22/2019    Orientation RESPIRATION BLADDER Height & Weight     Self, Time, Situation, Place  Normal Continent Weight: 98.4 kg Height:  5\' 11"  (180.3 cm)  BEHAVIORAL SYMPTOMS/MOOD NEUROLOGICAL BOWEL NUTRITION STATUS      Continent (Regular, thin liquids)  AMBULATORY STATUS COMMUNICATION OF NEEDS Skin   Extensive Assist Verbally Skin abrasions(Healing laceration to left scalp)                       Personal Care Assistance Level of Assistance  Bathing, Feeding, Dressing Bathing Assistance: Limited assistance Feeding assistance: Independent Dressing Assistance: Limited assistance     Functional Limitations Info             SPECIAL CARE FACTORS FREQUENCY  Speech therapy     PT Frequency: 5-6 times weekly OT Frequency: 5-6 times weekly     Speech Therapy Frequency: 5-6 times weekly      Contractures Contractures Info: Not present    Additional Factors Info  Code Status, Allergies Code Status Info: Full  code Allergies Info: Terazosin-unknown reaction           Current Medications (11/26/2019):  This is the current hospital active medication list Current Facility-Administered Medications  Medication Dose Route Frequency Provider Last Rate Last Admin  . acetaminophen (TYLENOL) tablet 650 mg  650 mg Oral Q6H Meuth, Brooke A, PA-C   650 mg at 11/26/19 1310  . ARIPiprazole (ABILIFY) tablet 7.5 mg  7.5 mg Oral Daily Meuth, Brooke A, PA-C   7.5 mg at 11/26/19 1311  . Chlorhexidine Gluconate Cloth 2 % PADS 6 each  6 each Topical Daily Georganna Skeans, MD   6 each at 11/26/19 [phone]  . enoxaparin (LOVENOX) injection 30 mg  30 mg Subcutaneous Q12H Jesusita Oka, MD   30 mg at 11/26/19 0837  . feeding supplement (ENSURE ENLIVE) (ENSURE ENLIVE) liquid 237 mL  237 mL Oral TID BM Jesusita Oka, MD   237 mL at 11/26/19 1311  . guaiFENesin (ROBITUSSIN) 100 MG/5ML solution 200 mg  10 mL Oral Q4H Jesusita Oka, MD   200 mg at 11/26/19 1314  . ipratropium-albuterol (DUONEB) 0.5-2.5 (3) MG/3ML nebulizer solution 3 mL  3 mL Nebulization Q4H PRN Jesusita Oka, MD      . levETIRAcetam (KEPPRA) tablet 500 mg  500 mg Oral BID Jesusita Oka, MD   500 mg at 11/26/19 0837  . lisinopril (ZESTRIL) tablet 10 mg  10 mg Oral Daily Meuth, Brooke A, PA-C   10  mg at 11/26/19 0837  . methocarbamol (ROBAXIN) tablet 500 mg  500 mg Oral Q6H Meuth, Brooke A, PA-C   500 mg at 11/26/19 1311  . metoprolol tartrate (LOPRESSOR) injection 5 mg  5 mg Intravenous Q6H PRN Georganna Skeans, MD   5 mg at 11/23/19 1127  . metoprolol tartrate (LOPRESSOR) tablet 50 mg  50 mg Oral BID Meuth, Brooke A, PA-C   50 mg at 11/26/19 0837  . ondansetron (ZOFRAN-ODT) disintegrating tablet 4 mg  4 mg Oral Q4H PRN Donnie Mesa, MD   4 mg at 11/24/19 2105   Or  . ondansetron [facility]) injection 4 mg  4 mg Intravenous Q6H PRN Donnie Mesa, MD   4 mg at 11/25/19 2246  . oxyCODONE (Oxy IR/ROXICODONE) immediate release tablet 10 mg  10 mg Oral Q4H  PRN Georganna Skeans, MD   10 mg at 11/23/19 1630  . oxyCODONE (Oxy IR/ROXICODONE) immediate release tablet 5 mg  5 mg Oral Q4H PRN Georganna Skeans, MD   5 mg at 11/26/19 1310  . pantoprazole (PROTONIX) EC tablet 40 mg  40 mg Oral Daily Georganna Skeans, MD   40 mg at 11/26/19 0837  . promethazine (PHENERGAN) injection 12.5 mg  12.5 mg Intravenous Q4H PRN Donnie Mesa, MD      . sodium chloride (OCEAN) 0.65 % nasal spray 1 spray  1 spray Each Nare PRN Meuth, Brooke A, PA-C      . sodium chloride tablet 1 g  1 g Oral TID WC Jesusita Oka, MD   1 g at 11/26/19 1310  . venlafaxine (EFFEXOR) tablet 300 mg  300 mg Oral Daily Meuth, Brooke A, PA-C   300 mg at 11/26/19 [id number]     Discharge Medications: Please see discharge summary for a list of discharge medications.  Relevant Imaging Results:  Relevant Lab Results:   Additional Information SS# [id number]    Reinaldo Raddle, RN, BSN  Trauma/Neuro ICU Case Manager [phone]

## 2019-11-26 NOTE — Progress Notes (Signed)
Physical Therapy Treatment Patient Details Name: Nathan [name] MRN: [id number] DOB: 24-Jun-1946 Today's Date: 11/26/2019    History of Present Illness 74yo M came in as a level 2 trauma after falling down stairs. Injuries include i bilateral rib fractures, C7 fracture, T12 fracture, and left scapular fracture.  CT of his head showed: Hemorrahagic contusion of the inferolateral Rt temporal lobe; small volume SAH anterior Lt temporal lobe, nondisplaced frx of the temporal and sphenoid bone; fx zygomatic arch, and minimally displaced fx Lt maxillary sinus.  . Pt with history of seizures and DVT     PT Comments    Patient progressing slowly PT goals. Reports pain in chest/ribs especially with coughing. Encouraged use of acapella and IS. Requires Mod A for bed mobility and transfers and Min A for gait training as pt very unsteady. Noted to have cognitive deficits relating to attention, awareness, memory and problem solving. Encouraged OOB to chair as much as tolerated. Will follow.   Follow Up Recommendations  CIR;Supervision/Assistance - 24 hour     Equipment Recommendations  Other (comment)(defer to next venue)    Recommendations for Other Services       Precautions / Restrictions Precautions Precautions: Fall;Cervical Precaution Comments: sling for scapular fx  Required Braces or Orthoses: Sling;Cervical Brace Cervical Brace: Hard collar Restrictions Weight Bearing Restrictions: Yes LUE Weight Bearing: Non weight bearing    Mobility  Bed Mobility Overal bed mobility: Needs Assistance Bed Mobility: Rolling;Sidelying to Sit Rolling: Mod assist Sidelying to sit: Mod assist;HOB elevated       General bed mobility comments: Cues for log roll technique , assist to roll and push up into sitting. Able to scoot bottom to EOB.  Transfers Overall transfer level: Needs assistance Equipment used: 1 person hand held assist Transfers: Sit to/from Stand Sit to Stand: Min assist;Mod  assist         General transfer comment: Initially Mod A to stand from EOB using momentum progressing to min A from toilet. Transferred to chair post ambulation.  Ambulation/Gait Ambulation/Gait assistance: Min assist Gait Distance (Feet): 15 Feet(x2 bouts) Assistive device: 1 person hand held assist Gait Pattern/deviations: Step-to pattern;Drifts right/left;Staggering right;Staggering left;Decreased step length - right;Decreased step length - left;Shuffle Gait velocity: reduced   General Gait Details: Short shuffling steps with decreased foot clearance; unsteady requiring HHA and support. Declined further distance, "i just feel unsteady."   Stairs             Wheelchair Mobility    Modified Rankin (Stroke Patients Only)       Balance Overall balance assessment: Needs assistance Sitting-balance support: No upper extremity supported;Feet supported Sitting balance-Leahy Scale: Fair Sitting balance - Comments: supervision   Standing balance support: During functional activity Standing balance-Leahy Scale: Poor Standing balance comment: Requires external support in standing.                            Cognition Arousal/Alertness: Awake/alert Behavior During Therapy: Flat affect Overall Cognitive Status: Impaired/Different from baseline Area of Impairment: Memory;Following commands;Awareness;Problem solving;Attention                   Current Attention Level: Sustained Memory: Decreased short-term memory Following Commands: Follows multi-step commands inconsistently;Follows multi-step commands with increased time   Awareness: Emergent Problem Solving: Slow processing;Decreased initiation;Requires verbal cues General Comments: Slow processing; A&Ox4.      Exercises General Exercises - Lower Extremity Ankle Circles/Pumps: Both;10 reps;Supine;AROM Long [employer]: AROM;Strengthening;Both;10  reps;Seated    General Comments General comments (skin  integrity, edema, etc.): Re-adjusted collar for better fit and placed LUE on pillow releasing sling for a break as pt's cervical spine was weighed down and positioned in right side bend from weight of arm/sling.      Pertinent Vitals/Pain Pain Assessment: Faces Faces Pain Scale: Hurts even more Pain Location: chest esp with coughing Pain Descriptors / Indicators: Grimacing;Guarding Pain Intervention(s): Monitored during session;Limited activity within patient's tolerance;Repositioned    Home Living                      Prior Function            PT Goals (current goals can now be found in the care plan section) Progress towards PT goals: Progressing toward goals(slowly)    Frequency    Min 5X/week      PT Plan Current plan remains appropriate    Co-evaluation              AM-PAC PT "6 Clicks" Mobility   Outcome Measure  Help needed turning from your back to your side while in a flat bed without using bedrails?: A Lot Help needed moving from lying on your back to sitting on the side of a flat bed without using bedrails?: A Lot Help needed moving to and from a bed to a chair (including a wheelchair)?: A Lot Help needed standing up from a chair using your arms (e.g., wheelchair or bedside chair)?: A Little Help needed to walk in hospital room?: A Little Help needed climbing 3-5 steps with a railing? : Total 6 Click Score: 13    End of Session Equipment Utilized During Treatment: Cervical collar;Gait belt;Other (comment)(sling LUE) Activity Tolerance: Patient tolerated treatment well;Patient limited by fatigue Patient left: in chair;with call bell/phone within reach;with chair alarm set Nurse Communication: Mobility status PT Visit Diagnosis: Unsteadiness on feet (R26.81);Repeated falls (R29.6);History of falling (Z91.81)     Time: 1413-1440 PT Time Calculation (min) (ACUTE ONLY): 27 min  Charges:  $Gait Training: 8-22 mins $Therapeutic Activity: 8-22  mins                     Marisa Severin, PT, DPT Acute Rehabilitation Services Pager [phone] [place] 11/26/2019, 3:36 PM

## 2019-11-27 LAB — SARS CORONAVIRUS 2 (TAT 6-24 HRS): SARS Coronavirus 2: NEGATIVE

## 2019-11-27 MED ORDER — LORATADINE 10 MG PO TABS
10.0000 mg | ORAL_TABLET | Freq: Every day | ORAL | Status: DC
  Administered 2019-11-27 – 2019-11-30 (×4): 10 mg via ORAL
  Filled 2019-11-27 (×4): qty 1

## 2019-11-27 NOTE — Progress Notes (Signed)
Trauma/Critical Care Follow Up Note  Subjective:    Overnight Issues: NAEO Cc rib pain. Feels he is slowly getting better. Tolerating PO. Improved breathing. Confirms that he has decided to pursue SNF over CIR because it is compatible with his [place] insurance.   Objective:  Vital signs for last 24 hours: Temp:  [97.6 F (36.4 C)-98.3 F (36.8 C)] 97.6 F (36.4 C) (05/04 0821) Pulse Rate:  [66-98] 98 (05/04 0821) Resp:  [16-25] 16 (05/04 0821) BP: (133-154)/(69-87) 137/87 (05/04 0821) SpO2:  [94 %-100 %] 98 % (05/04 0821)  Hemodynamic parameters for last 24 hours:    Intake/Output from previous day: 05/03 0701 - 05/04 0700 In: 1210 [P.O.:1210] Out: 525 [Urine:525]  Intake/Output this shift: Total I/O In: 200 [P.O.:200] Out: -   Vent settings for last 24 hours:    Physical Exam:  Gen: comfortable, no distress, sitting up in chair Neuro: non-focal exam HEENT: PERRL Neck: c-collar in place CV: RRR Pulm: unlabored breathing, CTAB, no crackles no wheezes Abd: soft, NT GU: clear yellow urine Extr: wwp, BLE edema, LUE in sling  Results for orders placed or performed during the hospital encounter of 11/22/19 (from the past 24 hour(s))  Basic metabolic panel     Status: Abnormal   Collection Time: 11/26/19 10:36 AM  Result Value Ref Range   Sodium 133 (L) 135 - 145 mmol/L   Potassium 4.4 3.5 - 5.1 mmol/L   Chloride 100 98 - 111 mmol/L   CO2 24 22 - 32 mmol/L   Glucose, Bld 120 (H) 70 - 99 mg/dL   BUN 16 8 - 23 mg/dL   Creatinine, Ser 0.97 0.61 - 1.24 mg/dL   Calcium 8.7 (L) 8.9 - 10.3 mg/dL   GFR calc non Af Amer >60 >60 mL/min   GFR calc Af Amer >60 >60 mL/min   Anion gap 9 5 - 15  CBC     Status: Abnormal   Collection Time: 11/26/19 10:36 AM  Result Value Ref Range   WBC 10.1 4.0 - 10.5 K/uL   RBC 3.36 (L) 4.22 - 5.81 MIL/uL   Hemoglobin 10.1 (L) 13.0 - 17.0 g/dL   HCT 30.6 (L) 39.0 - 52.0 %   MCV 91.1 80.0 - 100.0 fL   MCH 30.1 26.0 - 34.0 pg   MCHC 33.0  30.0 - 36.0 g/dL   RDW 13.3 11.5 - 15.5 %   Platelets 409 (H) 150 - 400 K/uL   nRBC 0.0 0.0 - 0.2 %    Assessment & Plan:  Present on Admission: . TBI (traumatic brain injury) ([place])    LOS: 5 days   Additional comments:I reviewed the patient's new clinical lab test results.   and I reviewed the patients new imaging test results.    Fall downstairs  Left scalp laceration- repaired by EDP TBIwith R IPH andL SAH- stable on repeat CT head 4/30, plan to start LMWH 5/2 (48hr after stable head CT), keppra x7d for sz ppx Left zygoma, left maxillary sinus, temporal bone fracture- per Dr. Wilburn Cornelia, nonop, saline nasal spray PRN, avoid nose blowing R rib fracture 11-12,Lrib fracture 1-4 with occult PTX - pulm toilet, IS C7 transverse process fracture- per Dr. 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 Received Kcentra. Will discuss timing of Eliquis resumption with NS Marcello Moores) Pulm congestion - improving, continue guaifenesin, IS/flutter,duonebs  ID - none FEN - KVO IV, reg diet, ensure,1g TID salt tabs for hyponatremia  VTE - SCDs, Lovenox for DVT PPx Foley - none Follow up - trauma, NS, ENT, PCP (Dr. Jeannetta Nap at [facility] in [place])  Plan - Continue therapies. Discuss DOAC with NS. Appreciate CM assistance arranging SNF. BMP in AM.  Obie Dredge, PA-C  Trauma & General Surgery Please use [email or web address] to contact on call provider  11/27/2019  *Care during the described time interval was provided by me. I have reviewed this patient's available data, including medical history,  events of note, physical examination and test results as part of my evaluation.

## 2019-11-27 NOTE — Progress Notes (Signed)
Inpatient Rehabilitation Admissions Coordinator  Noted plans for SNF per Almyra Free, RN CM. We will sign off at this time.  Danne Baxter, RN, MSN Rehab Admissions Coordinator [phone] 11/27/2019 1:20 PM

## 2019-11-27 NOTE — TOC Progression Note (Signed)
Transition of Care [facility]) - Progression Note    Patient Details  Name: [name] MRN: [id number] Date of Birth: 03/11/1946  Transition of Care [facility]) CM/SW Contact  Ella Bodo, RN Phone Number: 11/27/2019, 2:29 PM  Clinical Narrative:  Pt and wife given bed offers for SNF; they have selected [facility] of [place].  Notified Cleon Dew, admissions liaison for this facility.  He will start insurance authorization with the [place].  Pt is seen at the [facility].  Pt will need updated Covid test prior to discharge.    Will provide updates as available.      Expected Discharge Plan: Skilled Nursing Facility Barriers to Discharge: Ship broker  Expected Discharge Plan and Services Expected Discharge Plan: [place]   Discharge Planning Services: CM Consult Post Acute Care Choice: [place] Living arrangements for the past 2 months: Single Family Home                                       Social Determinants of Health (SDOH) Interventions    Readmission Risk Interventions No flowsheet data found.   Reinaldo Raddle, RN, BSN  Trauma/Neuro ICU Case Manager [phone]

## 2019-11-27 NOTE — Progress Notes (Signed)
Physical Therapy Treatment Patient Details Name: Nathan [name] MRN: [id number] DOB: Feb 08, 1946 Today's Date: 11/27/2019    History of Present Illness 74yo M came in as a level 2 trauma after falling down stairs. Injuries include i bilateral rib fractures, C7 fracture, T12 fracture, and left scapular fracture.  CT of his head showed: Hemorrahagic contusion of the inferolateral Rt temporal lobe; small volume SAH anterior Lt temporal lobe, nondisplaced frx of the temporal and sphenoid bone; fx zygomatic arch, and minimally displaced fx Lt maxillary sinus.  . Pt with history of seizures and DVT     PT Comments    Pt remains to require significant assist with bed mobility due to back pain, cervical precautions, and L UE in sling/NWB. Pt with improved ambulation tolerance to 30' with HHA however with significant fear of falling causing pt to have very short shuffled steps despite max verbal cues to use PTs hand via R HHA to provide support. Pt with difficulty using urinal as well due to inability to bend neck or use L UE. Spoke with RN tech about assisting pt to sitting or standing to use urinal. Con't to recommend CIR upon d/c to maximize indep for safe transition home with spouse. Acute PT to cont to follow.    Follow Up Recommendations  CIR;Supervision/Assistance - 24 hour     Equipment Recommendations  Other (comment)    Recommendations for Other Services Rehab consult     Precautions / Restrictions Precautions Precautions: Fall;Cervical Precaution Booklet Issued: No Precaution Comments: sling for scapular fx  Required Braces or Orthoses: Sling;Cervical Brace Cervical Brace: Hard collar(adjusted for optimal fit and to prevent skin break down) Restrictions Weight Bearing Restrictions: Yes LUE Weight Bearing: Non weight bearing    Mobility  Bed Mobility Overal bed mobility: Needs Assistance Bed Mobility: Rolling;Sidelying to Sit Rolling: Mod assist Sidelying to sit: Mod assist;HOB  elevated       General bed mobility comments: max directional verbal cues, pt unable to use L UE to assist as it's NWB, pt with generalized weakness and trunk strength requiring modA for trunk elevation to EOB  Transfers Overall transfer level: Needs assistance Equipment used: 1 person hand held assist Transfers: Sit to/from Stand Sit to Stand: Min assist         General transfer comment: minA for comfort as pt with fear of falling, pt given RW to hold onto with R hand while PT provided pericare as pt was soiled in urine  Ambulation/Gait Ambulation/Gait assistance: Min assist;Mod assist Gait Distance (Feet): 30 Feet Assistive device: 1 person hand held assist Gait Pattern/deviations: Step-through pattern;Decreased stride length;Shuffle Gait velocity: reduced Gait velocity interpretation: <1.31 ft/sec, indicative of household ambulator General Gait Details: short shuffling steps, max verbal cues to push down into R hand to support. Pt unable to clear either foot due to fear of falling and report of "my back is hurting". pt unable to use walker as pt is L UE NWB   Stairs             Wheelchair Mobility    Modified Rankin (Stroke Patients Only)       Balance Overall balance assessment: Needs assistance Sitting-balance support: No upper extremity supported;Feet supported Sitting balance-Leahy Scale: Fair Sitting balance - Comments: supervision   Standing balance support: During functional activity Standing balance-Leahy Scale: Poor Standing balance comment: Requires external support in standing or holding onto RW with R UE  Cognition Arousal/Alertness: Awake/alert Behavior During Therapy: Flat affect Overall Cognitive Status: Impaired/Different from baseline Area of Impairment: Problem solving;Safety/judgement                   Current Attention Level: Sustained   Following Commands: Follows multi-step commands with  increased time;Follows one step commands with increased time;Follows one step commands consistently Safety/Judgement: Decreased awareness of deficits(cervical and proper fitting of cervical collar) Awareness: Emergent Problem Solving: Slow processing;Decreased initiation;Difficulty sequencing;Requires verbal cues;Requires tactile cues General Comments: pt with fear of falling which could be contributing to delayed processing and hesitation with mobility, had to assist with problem solving on how to use urinal with L UE in sling and cervical collar as pt urinating all over the bed and not in the urinal      Exercises General Exercises - Lower Extremity Ankle Circles/Pumps: AROM;Both;10 reps;Seated Long Arc Quad: AROM;Both;10 reps;Seated(with 5 second hold) Hip Flexion/Marching: AROM;Both;10 reps;Seated(tighten abs when lifting knee)    General Comments General comments (skin integrity, edema, etc.): re-adjusted c-collar for optimal positiong, pt with bruising all over L UE, no drainage from head incision      Pertinent Vitals/Pain Pain Assessment: 0-10 Faces Pain Scale: Hurts whole lot Pain Location: upper back Pain Descriptors / Indicators: Discomfort;Guarding;Grimacing Pain Intervention(s): Monitored during session    Home Living                      Prior Function            PT Goals (current goals can now be found in the care plan section) Progress towards PT goals: Progressing toward goals    Frequency    Min 5X/week      PT Plan Current plan remains appropriate    Co-evaluation              AM-PAC PT "6 Clicks" Mobility   Outcome Measure  Help needed turning from your back to your side while in a flat bed without using bedrails?: A Lot Help needed moving from lying on your back to sitting on the side of a flat bed without using bedrails?: A Lot Help needed moving to and from a bed to a chair (including a wheelchair)?: A Lot Help needed standing  up from a chair using your arms (e.g., wheelchair or bedside chair)?: A Lot Help needed to walk in hospital room?: A Little Help needed climbing 3-5 steps with a railing? : Total 6 Click Score: 12    End of Session Equipment Utilized During Treatment: Gait belt;Cervical collar;Other (comment)(sling on L LUE) Activity Tolerance: Patient tolerated treatment well;Patient limited by fatigue Patient left: in chair;with call bell/phone within reach;with chair alarm set Nurse Communication: Mobility status PT Visit Diagnosis: Unsteadiness on feet (R26.81);Repeated falls (R29.6);History of falling (Z91.81)     Time: [id number] PT Time Calculation (min) (ACUTE ONLY): 28 min  Charges:  $Gait Training: 8-22 mins $Therapeutic Activity: 8-22 mins                     Kittie Plater, PT, DPT Acute Rehabilitation Services Pager #: [phone] Office #: [phone]    Berline Lopes 11/27/2019, 9:19 AM

## 2019-11-28 LAB — BASIC METABOLIC PANEL
Anion gap: 10 (ref 5–15)
BUN: 25 mg/dL — ABNORMAL HIGH (ref 8–23)
CO2: 25 mmol/L (ref 22–32)
Calcium: 8.6 mg/dL — ABNORMAL LOW (ref 8.9–10.3)
Chloride: 98 mmol/L (ref 98–111)
Creatinine, Ser: 0.93 mg/dL (ref 0.61–1.24)
GFR calc Af Amer: >60 mL/min (ref >60)
GFR calc non Af Amer: >60 mL/min (ref >60)
Glucose, Bld: 105 mg/dL — ABNORMAL HIGH (ref 70–99)
Potassium: 4.5 mmol/L (ref 3.5–5.1)
Sodium: 133 mmol/L — ABNORMAL LOW (ref 135–145)

## 2019-11-28 MED ORDER — LEVETIRACETAM 500 MG PO TABS: 500.0000 mg | ORAL_TABLET | Freq: Two times a day (BID) | ORAL | 0 refills | Status: DC

## 2019-11-28 MED ORDER — SALINE SPRAY 0.65 % NA SOLN: 1.0000 | NASAL | 0 refills | Status: DC | PRN

## 2019-11-28 MED ORDER — ENOXAPARIN SODIUM 30 MG/0.3ML ~~LOC~~ SOLN: 30.0000 mg | Freq: Two times a day (BID) | SUBCUTANEOUS | Status: DC

## 2019-11-28 MED ORDER — ACETAMINOPHEN 325 MG PO TABS: 650.0000 mg | ORAL_TABLET | Freq: Four times a day (QID) | ORAL | Status: DC

## 2019-11-28 MED ORDER — METHOCARBAMOL 500 MG PO TABS: 500.0000 mg | ORAL_TABLET | Freq: Four times a day (QID) | ORAL | Status: DC

## 2019-11-28 MED ORDER — GUAIFENESIN 100 MG/5ML PO SOLN: 10.0000 mL | ORAL | 0 refills | Status: DC

## 2019-11-28 MED ORDER — LORATADINE 10 MG PO TABS: 10.0000 mg | ORAL_TABLET | Freq: Every day | ORAL | Status: DC

## 2019-11-28 MED ORDER — APIXABAN 5 MG PO TABS
10.0000 mg | ORAL_TABLET | Freq: Two times a day (BID) | ORAL | Status: DC
Start: 2019-12-06 — End: 2020-04-10

## 2019-11-28 MED ORDER — SODIUM CHLORIDE 1 G PO TABS: 1.0000 g | ORAL_TABLET | Freq: Three times a day (TID) | ORAL | Status: AC

## 2019-11-28 MED ORDER — ENSURE ENLIVE PO LIQD: 237.0000 mL | Freq: Three times a day (TID) | ORAL | 12 refills | Status: DC

## 2019-11-28 MED ORDER — OXYCODONE HCL 10 MG PO TABS: 5.0000 mg | ORAL_TABLET | ORAL | 0 refills | Status: DC | PRN

## 2019-11-28 NOTE — Discharge Summary (Signed)
Physician Discharge Summary  Patient ID: Nathan [name] MRN: [id number] DOB/AGE: [birth year] 74 y.o.  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 Deno Etienne  HPI: Patient is a 74 year old male who came in as a level 2 trauma after falling down some stairs. He was amnestic to event. He was on eliquis for previous DVT which was reportedly 3 years ago. Given Kcentra in the ED. Complained of headache. 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 Scalp laceration stapled by ED provider as listed above. Patient admitted to the trauma service.  Hospital Course: Neurosurgery consulted for TBI and spinal fractures and recommended collar and TLSO braces. Follow up head CT 4/30 was stable. Orthopedic surgery consulted for left clavicle and scapula fractures and recommended sling and NWB to LUE. Clarified that patient may have sling off in bed, but will need sling on when OOB. ENT consulted for facial fractures and recommended non-operative management. Staples removed  from left scalp laceration 5/5. Therapies evaluated patient and recommended inpatient rehab but patient preferred to go to SNF rehab through [facility]. Chronic anticoagulation discussed with neurosurgery, they recommend holding eliquis until 12/06/19 and resuming after that date. On 11/30/19 patient was tolerating a diet, voiding appropriately, pain well controlled, VSS and overall felt stable for discharge to SNF. Follow up is as outlined below.   PE: General: pleasant, WD, WN white male who is laying in bed in NAD HEENT: interval removal of staples from left scalp laceration. Sclera are noninjected.  PERRL.  Ears and nose without any masses or lesions.  Mouth is pink and moist Neck: collar present Heart: regular, rate, and rhythm.  Normal s1,s2. No obvious murmurs, gallops, or rubs noted.  Palpable radial and pedal pulses bilaterally Lungs: CTAB, no wheezes, rhonchi, or rales noted.  Respiratory effort nonlabored Abd: soft, NT, ND, +BS, no masses, hernias, or organomegaly MS: LUE in sling, L hand NVI; BL LE without edema  Skin: warm and dry with no masses, lesions, or rashes Neuro: Cranial nerves 2-12 grossly intact, sensation grossly intact throughout Psych: A&Ox3 with an appropriate affect.  Allergies as of 11/30/2019      Reactions   Terazosin Other (See Comments)   unknown      Medication List    TAKE these medications   acetaminophen 325 MG tablet Commonly known as: TYLENOL Take 2 tablets (650 mg total) by mouth every 6 (six) hours.   apixaban 5 MG Tabs tablet Commonly known as: Eliquis Take 2 tablets (10 mg total) by mouth in the morning and at bedtime. Start taking on: Dec 06, 2019 What changed:   medication strength  These instructions start on Dec 06, 2019. If you are unsure what to do until  then, ask your doctor or other care provider.   ARIPiprazole 15 MG tablet Commonly known as: ABILIFY Take 7.5 mg by mouth daily.   docusate sodium 100 MG capsule Commonly known  as: COLACE Take 1 capsule (100 mg total) by mouth 2 (two) times daily.   enoxaparin 30 MG/0.3ML injection Commonly known as: LOVENOX Inject 0.3 mLs (30 mg total) into the skin every 12 (twelve) hours for 8 days.   feeding supplement (ENSURE ENLIVE) Liqd Take 237 mLs by mouth 3 (three) times daily between meals.   fluticasone 50 MCG/ACT nasal spray Commonly known as: FLONASE Place 2 sprays into both nostrils daily.   guaiFENesin 100 MG/5ML Soln Commonly known as: ROBITUSSIN Take 10 mLs (200 mg total) by mouth every 4 (four) hours.   levETIRAcetam 500 MG tablet Commonly known as: KEPPRA Take 1 tablet (500 mg total) by mouth 2 (two) times daily for 8 days.   lisinopril 10 MG tablet Commonly known as: ZESTRIL Take 10 mg by mouth daily.   loratadine 10 MG tablet Commonly known as: CLARITIN Take 1 tablet (10 mg total) by mouth daily.   methocarbamol 500 MG tablet Commonly known as: ROBAXIN Take 1 tablet (500 mg total) by mouth every 6 (six) hours.   metoprolol tartrate 50 MG tablet Commonly known as: LOPRESSOR Take 50 mg by mouth 2 (two) times daily.   multivitamin with minerals Tabs tablet Take 1 tablet by mouth daily.   OMEGA 3 500 PO Take 1,000 mg by mouth daily.   omeprazole 10 MG capsule Commonly known as: PRILOSEC Take 10 mg by mouth daily.   Oxycodone HCl 10 MG Tabs Take 0.5-1 tablets (5-10 mg total) by mouth every 4 (four) hours as needed for moderate pain or severe pain.   polyethylene glycol 17 g packet Commonly known as: MIRALAX / GLYCOLAX Take 17 g by mouth daily.   simvastatin 5 MG tablet Commonly known as: ZOCOR Take 2.5 mg by mouth daily.   sodium chloride 0.65 % Soln nasal spray Commonly known as: OCEAN Place 1 spray into both nostrils as needed for congestion.   sodium chloride 1 g tablet Take 1 tablet (1 g total) by mouth 3 (three) times daily with meals for 3 days.   venlafaxine 100 MG tablet Commonly known as: EFFEXOR Take 300 mg by  mouth daily.        Follow-up Information    Vallarie Mare, MD. Call.   Specialty: Neurosurgery Why: Call and schedule follow up regarding spine fractures and TBI Contact information: [address] [place] [address] [phone]        Jerrell Belfast, MD. Call.   Specialty: Otolaryngology Why: Call and schedule follow up regarding facial fractures Contact information: [address] [place] [address] [phone]        Your primary Doctor. Call.   Why: Call and schedule a follow up appointment for post-hospitalization to assume care once you are discharged from rehab       Haddix, Thomasene Lot, MD. Call.   Specialty: Orthopedic Surgery Why: Call and schedule a follow up appointment regarding left scapula and clavicle fractures Contact information: [place] [address] [phone]           Signed: Norm Parcel , [facility] 11/30/2019, 9:13 AM Please see Amion for pager number during day hours 7:00am-4:30pm

## 2019-11-28 NOTE — TOC Progression Note (Signed)
Transition of Care [facility]) - Progression Note    Patient Details  Name: Nathan [name] MRN: [id number] Date of Birth: 1945-08-30  Transition of [facility]) CM/SW Contact  Oren Section Cleta Alberts, RN Phone Number: 11/28/2019, 1:51 PM  Clinical Narrative:  Per Kathalene Frames, [name] is not contracted with them for short term SNF.  Only [facility] in [place], [facility] [place] [place], and [facility].  Pt does not want to go to any of these facilities, but prefers to use his [facility] benefit over his Medicare.  I spoke with Cleon Dew with [facility]; he states they have taken patients in the past with [facility] out of [place].  He plans to call his VA contact and call me back with an update.       Expected Discharge Plan: Skilled Nursing Facility Barriers to Discharge: Ship broker  Expected Discharge Plan and Services Expected Discharge Plan: [place]   Discharge Planning Services: CM Consult Post Acute Care Choice: [place] Living arrangements for the past 2 months: Single Family Home                                       Social Determinants of Health (SDOH) Interventions    Readmission Risk Interventions No flowsheet data found.  Reinaldo Raddle, RN, BSN  Trauma/Neuro ICU Case Manager [phone]

## 2019-11-28 NOTE — Progress Notes (Signed)
RN removed 7 staples from left scalp laceration per MD order.  Sharin Mons, RN

## 2019-11-28 NOTE — Progress Notes (Signed)
Physical Therapy Treatment Patient Details Name: Nathan [name] MRN: [id number] DOB: 02/03/1946 Today's Date: 11/28/2019    History of Present Illness 74yo M came in as a level 2 trauma after falling down stairs. Injuries include i bilateral rib fractures, C7 fracture, T12 fracture, and left scapular fracture.  CT of his head showed: Hemorrahagic contusion of the inferolateral Rt temporal lobe; small volume SAH anterior Lt temporal lobe, nondisplaced frx of the temporal and sphenoid bone; fx zygomatic arch, and minimally displaced fx Lt maxillary sinus.  . Pt with history of seizures and DVT     PT Comments    Pt tolerated treatment well with significant shuffling and reduced step length initially. Pt with mild improvement in step length and foot clearance with PT cues during gait, although still with significantly slowed and unsteady gait. Pt also providing verbal and visual cues for improved transfers. Pt continues to require PT support for all mobility at this time and remains at a high falls risk. Pt will benefit from continued acute PT POC to reduce falls risk and aide in a return to the pt's PLOF.   Follow Up Recommendations  SNF;Supervision/Assistance - 24 hour(pt refusing CIR at this time, prefers SNF)     Equipment Recommendations  Cane    Recommendations for Other Services       Precautions / Restrictions Precautions Precautions: Fall;Cervical Precaution Booklet Issued: No Precaution Comments: sling for scapular fx  Required Braces or Orthoses: Sling;Cervical Brace Cervical Brace: Hard collar Restrictions Weight Bearing Restrictions: Yes LUE Weight Bearing: Non weight bearing    Mobility  Bed Mobility Overal bed mobility: Needs Assistance Bed Mobility: Sit to Sidelying;Rolling Rolling: Min assist       Sit to sidelying: Min assist General bed mobility comments: Pt requires assistance for LE management  Transfers Overall transfer level: Needs assistance Equipment  used: None Transfers: Sit to/from Stand Sit to Stand: Min assist         General transfer comment: pt requries cues for forward lean and trunk flexion as well as rocking to facilitate momentum during transfers  Ambulation/Gait Ambulation/Gait assistance: Min assist Gait Distance (Feet): 40 Feet(40' x2 and additional trial of 25') Assistive device: 1 person hand held assist;IV Pole Gait Pattern/deviations: Step-to pattern;Step-through pattern;Shuffle;Decreased step length - right;Decreased step length - left;Decreased dorsiflexion - right;Decreased dorsiflexion - left Gait velocity: reduced Gait velocity interpretation: <1.8 ft/sec, indicate of risk for recurrent falls General Gait Details: pt initially with short shuffling steps with initial toe strike and increasd forward trunk lean with anterior LOB with PT hand hold. PT provides cues to facilitate heel strike with roll to toes, increase step length, and to narrow BOS as pt initially taking very wide steps   Stairs             Wheelchair Mobility    Modified Rankin (Stroke Patients Only)       Balance Overall balance assessment: Needs assistance Sitting-balance support: No upper extremity supported;Feet supported Sitting balance-Leahy Scale: Good Sitting balance - Comments: close supervision   Standing balance support: Single extremity supported Standing balance-Leahy Scale: Fair Standing balance comment: minG-minA with UE support of hand hold or IV pole                            Cognition Arousal/Alertness: Awake/alert Behavior During Therapy: Flat affect Overall Cognitive Status: Within Functional Limits for tasks assessed  Exercises      General Comments General comments (skin integrity, edema, etc.): VSS during session      Pertinent Vitals/Pain Pain Assessment: 0-10 Pain Score: 3  Pain Location: upper back Pain Descriptors /  Indicators: Aching Pain Intervention(s): Monitored during session    Home Living                      Prior Function            PT Goals (current goals can now be found in the care plan section) Acute Rehab PT Goals Patient Stated Goal: to be able to take care of self  Progress towards PT goals: Progressing toward goals    Frequency    Min 5X/week      PT Plan Current plan remains appropriate    Co-evaluation              AM-PAC PT "6 Clicks" Mobility   Outcome Measure  Help needed turning from your back to your side while in a flat bed without using bedrails?: A Little Help needed moving from lying on your back to sitting on the side of a flat bed without using bedrails?: A Little Help needed moving to and from a bed to a chair (including a wheelchair)?: A Little Help needed standing up from a chair using your arms (e.g., wheelchair or bedside chair)?: A Little Help needed to walk in hospital room?: A Lot Help needed climbing 3-5 steps with a railing? : A Lot 6 Click Score: 16    End of Session Equipment Utilized During Treatment: Cervical collar Activity Tolerance: Patient tolerated treatment well Patient left: in bed;with call bell/phone within reach;with bed alarm set Nurse Communication: Mobility status PT Visit Diagnosis: Unsteadiness on feet (R26.81);Repeated falls (R29.6);History of falling (Z91.81)     Time: [id number] PT Time Calculation (min) (ACUTE ONLY): 26 min  Charges:  $Gait Training: 23-37 mins                     Zenaida Niece, PT, DPT Acute Rehabilitation Pager: [phone]    Zenaida Niece 11/28/2019, 5:59 PM

## 2019-11-28 NOTE — Progress Notes (Signed)
[facility] Progress Note     Subjective: Soreness in L chest from rib fractures. Overall doing well with pain control. Discussed resumption of eliquis with NS who would prefer to wait 2 weeks from injury (5/13). Patient using flutter valve, had not used IS in a while. Denies numbness or tingling. Alert and oriented.   Objective: Vital signs in last 24 hours: Temp:  [97.7 F (36.5 C)-98.4 F (36.9 C)] 98.2 F (36.8 C) (05/05 0745) Pulse Rate:  [67-88] 88 (05/05 0745) Resp:  [13-24] 24 (05/05 0745) BP: (102-132)/(62-85) 132/81 (05/05 0745) SpO2:  [93 %-99 %] 95 % (05/05 0745) Last BM Date: 11/23/19  Intake/Output from previous day: 05/04 0701 - 05/05 0700 In: 550 [P.O.:550] Out: 275 [Urine:275] Intake/Output this shift: Total I/O In: -  Out: 200 [Urine:200]  PE: General: pleasant, WD, WN white male who is laying in bed in NAD HEENT: staples present to left scalp laceration. Sclera are noninjected.  PERRL.  Ears and nose without any masses or lesions.  Mouth is pink and moist Neck: collar present Heart: regular, rate, and rhythm.  Normal s1,s2. No obvious murmurs, gallops, or rubs noted.  Palpable radial and pedal pulses bilaterally Lungs: CTAB, no wheezes, rhonchi, or rales noted.  Respiratory effort nonlabored Abd: soft, NT, ND, +BS, no masses, hernias, or organomegaly MS: LUE in sling, L hand NVI; BL LE without edema  Skin: warm and dry with no masses, lesions, or rashes Neuro: Cranial nerves 2-12 grossly intact, sensation grossly intact throughout Psych: A&Ox3 with an appropriate affect.   Lab Results:  Recent Labs    11/26/19 1036  WBC 10.1  HGB 10.1*  HCT 30.6*  PLT 409*   BMET Recent Labs    11/26/19 1036 11/28/19 0208  NA 133* 133*  K 4.4 4.5  CL 100 98  CO2 24 25  GLUCOSE 120* 105*  BUN 16 25*  CREATININE 0.97 0.93  CALCIUM 8.7* 8.6*   PT/INR No results for input(s): LABPROT, INR in the last 72 hours. CMP     Component Value  Date/Time   NA 133 (L) 11/28/2019 0208   K 4.5 11/28/2019 0208   CL 98 11/28/2019 0208   CO2 25 11/28/2019 0208   GLUCOSE 105 (H) 11/28/2019 0208   BUN 25 (H) 11/28/2019 0208   CREATININE 0.93 11/28/2019 0208   CALCIUM 8.6 (L) 11/28/2019 0208   PROT 6.9 11/22/2019 1513   ALBUMIN 3.1 (L) 11/22/2019 1513   AST 30 11/22/2019 1513   ALT 18 11/22/2019 1513   ALKPHOS 55 11/22/2019 1513   BILITOT 0.5 11/22/2019 1513   GFRNONAA >60 11/28/2019 0208   GFRAA >60 11/28/2019 0208   Lipase  No results found for: LIPASE     Studies/Results: No results found.  Anti-infectives: Anti-infectives (From admission, onward)   None       Assessment/Plan Fall downstairs  Left scalp laceration- repaired by EDP, remove staples today TBIwith R IPH andL SAH- stable on repeat CT head4/30,plan tostart LMWH 5/2 (48hr after stable head CT), keppra x7d for sz ppx Left zygoma, left maxillary sinus, temporal bone fracture-per Dr. Wilburn Cornelia, nonop, saline nasal spray PRN, avoid nose blowing R rib fracture 11-12,Lrib fracture 1-4 with occult PTX - pulm toilet, IS C7 transverse process fracture-per Dr. Marcello Moores, c-collar, f/u 4-6 weeks with xrays for c-spine clearance T12 endplate fracture and QA348G TVP fracture- okay to mobilize, if he has pain in this area with standing will need TLSO HTN - home metoprolol and lisinopril  Seizure disorder - recently started onoxcarbamezepinebut this is not showing up in pharmacy list, continue keppra for now HLD PTSD- home meds H/o unprovoked DVT on eliquis - states that he was told he will be on this indefinitely. Received Kcentra. NS ok with restarting eliquis 12/06/19 Pulm congestion - improving, continue guaifenesin, IS/flutter,duonebs  ID -none FEN -KVO IV, reg diet, ensure,1g TID salt tabs for hyponatremia  VTE -SCDs, Lovenox for DVT PPx, resume eliquis 5/13 Foley -none Follow up- NS, ENT, PCP (Dr. Jeannetta Nap at [facility] in [place])  Plan-  Continue therapies. repeat COVID negative. Remove staples today. Patient medically stable for discharge to SNF  LOS: 6 days    Norm Parcel , [facility] 11/28/2019, 9:40 AM Please see Amion for pager number during day hours 7:00am-4:30pm

## 2019-11-29 MED ORDER — DOCUSATE SODIUM 100 MG PO CAPS
100.0000 mg | ORAL_CAPSULE | Freq: Two times a day (BID) | ORAL | Status: DC
  Administered 2019-11-29 – 2019-11-30 (×3): 100 mg via ORAL
  Filled 2019-11-29 (×3): qty 1

## 2019-11-29 MED ORDER — DOCUSATE SODIUM 100 MG PO CAPS: 100.0000 mg | ORAL_CAPSULE | Freq: Two times a day (BID) | ORAL | 0 refills | Status: DC

## 2019-11-29 MED ORDER — POLYETHYLENE GLYCOL 3350 17 G PO PACK: 17.0000 g | PACK | Freq: Every day | ORAL | 0 refills | Status: DC

## 2019-11-29 MED ORDER — POLYETHYLENE GLYCOL 3350 17 G PO PACK
17.0000 g | PACK | Freq: Every day | ORAL | Status: DC
  Administered 2019-11-29 – 2019-11-30 (×2): 17 g via ORAL
  Filled 2019-11-29 (×2): qty 1

## 2019-11-29 NOTE — Progress Notes (Signed)
Inpatient Rehabilitation Admissions Coordinator  Notified by RN CM, Almyra Free, that patient would like to consider using his [facility] for CIR admit. I would not have an available bed to pursue insurance approval for this patient this week. I recommend to continue other rehab venue options at this time.  Danne Baxter, RN, MSN Rehab Admissions Coordinator [phone] 11/29/2019 11:01 AM

## 2019-11-29 NOTE — Progress Notes (Signed)
Physical Therapy Treatment Patient Details Name: Nathan [name] MRN: [id number] DOB: 17-Dec-1945 Today's Date: 11/29/2019    History of Present Illness 74yo M came in as a level 2 trauma after falling down stairs. Injuries include i bilateral rib fractures, C7 fracture, T12 fracture, and left scapular fracture.  CT of his head showed: Hemorrahagic contusion of the inferolateral Rt temporal lobe; small volume SAH anterior Lt temporal lobe, nondisplaced frx of the temporal and sphenoid bone; fx zygomatic arch, and minimally displaced fx Lt maxillary sinus.  . Pt with history of seizures and DVT     PT Comments    Pt tolerates treatment well, demonstrating improved heel strike and foot clearance initially but does revert to shuffling gait with fatigue. Pt continues to require cues to facilitate forward trunk lean during transfers as well as rocking to gain momentum, pt requiring physical assistance without use of these techniques due to power deficits. Pt will continue to benefit from acute PT POC to improve LE power, activity tolerance, and to reduce falls risk.  Follow Up Recommendations  CIR;Supervision/Assistance - 24 hour     Equipment Recommendations  Cane    Recommendations for Other Services       Precautions / Restrictions Precautions Precautions: Fall;Cervical Precaution Booklet Issued: No Precaution Comments: sling for scapular fx  Required Braces or Orthoses: Sling;Cervical Brace Cervical Brace: Hard collar Restrictions Weight Bearing Restrictions: Yes LUE Weight Bearing: Non weight bearing    Mobility  Bed Mobility                  Transfers Overall transfer level: Needs assistance Equipment used: None Transfers: Sit to/from Stand Sit to Stand: Min assist            Ambulation/Gait Ambulation/Gait assistance: Min Web designer (Feet): 50 Feet(additional 30') Assistive device: IV Pole Gait Pattern/deviations: Step-to pattern Gait velocity:  reduced Gait velocity interpretation: <1.8 ft/sec, indicate of risk for recurrent falls General Gait Details: pt with short step to gait, improved initial heel strike although foot clearance remains reduced. Pt also with increased shuffling with fatigue   Stairs             Wheelchair Mobility    Modified Rankin (Stroke Patients Only)       Balance Overall balance assessment: Needs assistance Sitting-balance support: No upper extremity supported;Feet supported Sitting balance-Leahy Scale: Fair Sitting balance - Comments: close supervision   Standing balance support: Single extremity supported Standing balance-Leahy Scale: Fair Standing balance comment: minG for static standing balance                            Cognition Arousal/Alertness: Awake/alert Behavior During Therapy: Flat affect Overall Cognitive Status: Within Functional Limits for tasks assessed                                        Exercises      General Comments General comments (skin integrity, edema, etc.): VSS on RA, PT assists with adjustment of LUE sling      Pertinent Vitals/Pain Pain Assessment: Faces Faces Pain Scale: Hurts little more Pain Location: upper back Pain Descriptors / Indicators: Aching Pain Intervention(s): Monitored during session    Home Living                      Prior Function  PT Goals (current goals can now be found in the care plan section) Acute Rehab PT Goals Patient Stated Goal: to be able to take care of self  Progress towards PT goals: Progressing toward goals    Frequency    Min 5X/week      PT Plan Discharge plan needs to be updated    Co-evaluation              AM-PAC PT "6 Clicks" Mobility   Outcome Measure  Help needed turning from your back to your side while in a flat bed without using bedrails?: A Little Help needed moving from lying on your back to sitting on the side of a flat bed  without using bedrails?: A Little Help needed moving to and from a bed to a chair (including a wheelchair)?: A Little Help needed standing up from a chair using your arms (e.g., wheelchair or bedside chair)?: A Little Help needed to walk in hospital room?: A Little Help needed climbing 3-5 steps with a railing? : A Lot 6 Click Score: 17    End of Session Equipment Utilized During Treatment: Cervical collar Activity Tolerance: Patient tolerated treatment well Patient left: in chair;with call bell/phone within reach(chair alarm left off, OT preparing to enter room ) Nurse Communication: Mobility status PT Visit Diagnosis: Unsteadiness on feet (R26.81);Repeated falls (R29.6);History of falling (Z91.81)     Time: [id number] PT Time Calculation (min) (ACUTE ONLY): 23 min  Charges:  $Gait Training: 8-22 mins $Therapeutic Activity: 8-22 mins                     Zenaida Niece, PT, DPT Acute Rehabilitation Pager: [phone]    Zenaida Niece 11/29/2019, 3:26 PM

## 2019-11-29 NOTE — Progress Notes (Addendum)
Occupational Therapy Treatment Patient Details Name: Nathan [name] MRN: [id number] DOB: Jun 17, 1946 Today's Date: 11/29/2019    History of present illness 74yo M came in as a level 2 trauma after falling down stairs. Injuries include i bilateral rib fractures, C7 fracture, T12 fracture, and left scapular fracture.  CT of his head showed: Hemorrahagic contusion of the inferolateral Rt temporal lobe; small volume SAH anterior Lt temporal lobe, nondisplaced frx of the temporal and sphenoid bone; fx zygomatic arch, and minimally displaced fx Lt maxillary sinus.  . Pt with history of seizures and DVT    OT comments  Pt continues to presenting with high motivation to participate in therapy. Pt performing oral care with Min Guard-Min A for balance at sink. Pt reporting that he performs medication management at home. Pt starting Pill Box Test and completing one pill bottle; however, very distracted by back pain. Unable to complete assessment but agreeable to re-attempt test at next session. During initial portion of test, pt demonstrating decreased ST memory and problem solving. Continue to recommend dc to CIR and will continue to follow acutely as admitted.    Follow Up Recommendations  CIR;Supervision/Assistance - 24 hour    Equipment Recommendations  3 in 1 bedside commode    Recommendations for Other Services Rehab consult    Precautions / Restrictions Precautions Precautions: Fall;Cervical Precaution Booklet Issued: No Precaution Comments: sling for scapular fx  Required Braces or Orthoses: Sling;Cervical Brace Cervical Brace: Hard collar Restrictions Weight Bearing Restrictions: Yes LUE Weight Bearing: Non weight bearing       Mobility Bed Mobility Overal bed mobility: Needs Assistance Bed Mobility: Sit to Sidelying;Rolling Rolling: Min assist       Sit to sidelying: Min assist General bed mobility comments: Pt requires assistance for LE management  Transfers Overall transfer  level: Needs assistance Equipment used: None Transfers: Sit to/from Stand Sit to Stand: Min assist Stand pivot transfers: Min assist       General transfer comment: Min A for balance and stability    Balance Overall balance assessment: Needs assistance Sitting-balance support: No upper extremity supported;Feet supported Sitting balance-Leahy Scale: Fair Sitting balance - Comments: close supervision   Standing balance support: Single extremity supported Standing balance-Leahy Scale: Fair Standing balance comment: minG for static standing balance                           ADL either performed or assessed with clinical judgement   ADL Overall ADL's : Needs assistance/impaired     Grooming: Oral care;Minimal assistance;Cueing for sequencing;Standing;Min guard Grooming Details (indicate cue type and reason): Min Guard-Min A for standing balance at sink. Cues for sequencing and using compensatory tehcniques                 Toilet Transfer: Minimal assistance;Ambulation(simulated to recliner)           Functional mobility during ADLs: Minimal assistance General ADL Comments: Pt performing oral care and functional mobility with MIn Guard-Min A. Challenging pt's executive functioning througout Pill Box Test.      Vision       Perception     Praxis      Cognition Arousal/Alertness: Awake/alert Behavior During Therapy: WFL for tasks assessed/performed Overall Cognitive Status: Impaired/Different from baseline Area of Impairment: Problem solving;Safety/judgement;Awareness;Following commands;Memory;Attention                   Current Attention Level: Sustained Memory: Decreased short-term memory Following Commands:  Follows multi-step commands with increased time;Follows one step commands with increased time;Follows one step commands consistently Safety/Judgement: Decreased awareness of deficits Awareness: Emergent Problem Solving: Slow  processing;Decreased initiation;Difficulty sequencing;Requires verbal cues;Requires tactile cues General Comments: Pt unable to complete Pill Box test due to back pain. Highly distracted by pain though very motivated to participate in therapy. Pt completing one bottle and then stating his back hurts too bad. Pt then forgetting which bottle he had completed demonstrating poor ST memory.        Exercises     Shoulder Instructions       General Comments VSS    Pertinent Vitals/ Pain       Pain Assessment: Faces Faces Pain Scale: Hurts little more Pain Location: upper back Pain Descriptors / Indicators: Aching Pain Intervention(s): Monitored during session;Limited activity within patient's tolerance;Repositioned  Home Living                                          Prior Functioning/Environment              Frequency  Min 2X/week        Progress Toward Goals  OT Goals(current goals can now be found in the care plan section)  Progress towards OT goals: Progressing toward goals  Acute Rehab OT Goals Patient Stated Goal: to be able to take care of self  OT Goal Formulation: With patient Time For Goal Achievement: 12/08/19 Potential to Achieve Goals: Good ADL Goals Pt Will Perform Grooming: with min guard assist;standing Pt Will Perform Upper Body Bathing: with min guard assist;sitting Pt Will Perform Lower Body Bathing: with min guard assist;sit to/from stand Pt Will Perform Upper Body Dressing: with supervision;sitting Pt Will Perform Lower Body Dressing: with min assist;sit to/from stand Pt Will Transfer to Toilet: with min guard assist;ambulating;regular height toilet;bedside commode;grab bars Pt Will Perform Toileting - Clothing Manipulation and hygiene: with min guard assist;sit to/from stand Additional ADL Goal #1: Pt will recall precautions using external cues and no assistance  Plan Discharge plan remains appropriate    Co-evaluation                  AM-PAC OT "6 Clicks" Daily Activity     Outcome Measure   Help from another person eating meals?: A Little Help from another person taking care of personal grooming?: A Little Help from another person toileting, which includes using toliet, bedpan, or urinal?: A Lot Help from another person bathing (including washing, rinsing, drying)?: A Lot Help from another person to put on and taking off regular upper body clothing?: A Lot Help from another person to put on and taking off regular lower body clothing?: A Lot 6 Click Score: 14    End of Session Equipment Utilized During Treatment: Cervical collar;Gait belt  OT Visit Diagnosis: Unsteadiness on feet (R26.81);Pain;Cognitive communication deficit (R41.841) Pain - Right/Left: Left Pain - part of body: Shoulder(ribs )   Activity Tolerance Patient tolerated treatment well   Patient Left with call bell/phone within reach;in bed;with bed alarm set   Nurse Communication Mobility status        Time: 1434-1510 OT Time Calculation (min): 36 min  Charges: OT General Charges $OT Visit: 1 Visit OT Treatments $Self Care/Home Management : 8-22 mins $Cognitive Funtion inital: Initial 15 mins  Romina Divirgilio MSOT, OTR/L Acute Rehab Pager: [phone] Office: [place]  11/29/2019, 6:20 PM

## 2019-11-29 NOTE — Progress Notes (Signed)
[facility] Progress Note     Subjective: Soreness in L chest from rib fractures, overall controlled with PO pain meds. Pulling >1,000 cc on IS. Expresses concern about "getting better" - eager for discharge where he can get more physical therapy. We discussed the difference in the level of rehabilitation he would get at an inpatient rehab compared to a SNF and the patient is reconsidering inpatient rehab.  Objective: Vital signs in last 24 hours: Temp:  [97.8 F (36.6 C)-98.5 F (36.9 C)] 97.8 F (36.6 C) (05/06 0814) Pulse Rate:  [71-90] 85 (05/06 0814) Resp:  [17-27] 19 (05/06 0814) BP: (107-143)/(63-76) 143/76 (05/06 0814) SpO2:  [93 %-99 %] 95 % (05/06 0814) Last BM Date: 11/23/19  Intake/Output from previous day: 05/05 0701 - 05/06 0700 In: -  Out: 1325 [Urine:1325] Intake/Output this shift: No intake/output data recorded.  PE: General: pleasant, WD, WN white male who is laying in bed in NAD HEENT: interval removal of staples from left scalp laceration. Sclera are noninjected.  PERRL.  Ears and nose without any masses or lesions.  Mouth is pink and moist Neck: collar present Heart: regular, rate, and rhythm.  Normal s1,s2. No obvious murmurs, gallops, or rubs noted.  Palpable radial and pedal pulses bilaterally Lungs: CTAB, no wheezes, rhonchi, or rales noted.  Respiratory effort nonlabored Abd: soft, NT, ND, +BS, no masses, hernias, or organomegaly MS: LUE in sling, L hand NVI; BL LE without edema  Skin: warm and dry with no masses, lesions, or rashes Neuro: Cranial nerves 2-12 grossly intact, sensation grossly intact throughout Psych: A&Ox3 with an appropriate affect.   Lab Results:  Recent Labs    11/26/19 1036  WBC 10.1  HGB 10.1*  HCT 30.6*  PLT 409*   BMET Recent Labs    11/26/19 1036 11/28/19 0208  NA 133* 133*  K 4.4 4.5  CL 100 98  CO2 24 25  GLUCOSE 120* 105*  BUN 16 25*  CREATININE 0.97 0.93  CALCIUM 8.7* 8.6*   PT/INR No  results for input(s): LABPROT, INR in the last 72 hours. CMP     Component Value Date/Time   NA 133 (L) 11/28/2019 0208   K 4.5 11/28/2019 0208   CL 98 11/28/2019 0208   CO2 25 11/28/2019 0208   GLUCOSE 105 (H) 11/28/2019 0208   BUN 25 (H) 11/28/2019 0208   CREATININE 0.93 11/28/2019 0208   CALCIUM 8.6 (L) 11/28/2019 0208   PROT 6.9 11/22/2019 1513   ALBUMIN 3.1 (L) 11/22/2019 1513   AST 30 11/22/2019 1513   ALT 18 11/22/2019 1513   ALKPHOS 55 11/22/2019 1513   BILITOT 0.5 11/22/2019 1513   GFRNONAA >60 11/28/2019 0208   GFRAA >60 11/28/2019 0208   Lipase  No results found for: LIPASE     Studies/Results: No results found.  Anti-infectives: Anti-infectives (From admission, onward)   None       Assessment/Plan Fall downstairs Left scalp laceration- repaired by EDP, removed staples 5/5 TBIwith R IPH andL SAH- stable on repeat CT head4/30,plan tostart LMWH 5/2 (48hr after stable head CT), keppra x7d for sz ppx Left zygoma, left maxillary sinus, temporal bone fracture-per Dr. Wilburn Cornelia, nonop, saline nasal spray PRN, avoid nose blowing R rib fracture 11-12,Lrib fracture 1-4 with occult PTX - pulm toilet, IS C7 transverse process fracture-per Dr. 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 Received Kcentra. NS ok with restarting eliquis 12/06/19 Pulm congestion - improving, continue guaifenesin, IS/flutter,duonebs  ID -none FEN -KVO IV, reg diet, ensure,1g TID salt tabs for hyponatremia  VTE -SCDs, Lovenox for DVT PPx, resume eliquis 5/13 Foley  -none Follow up- NS, ENT, PCP (Dr. Jeannetta Nap at [facility] in [place])  Plan- Continue therapies. repeat COVID negative 5/4. Patient medically stable for discharge to SNF vs. Inpatient rehab.    LOS: 7 days    [facility] 11/29/2019, 9:39 AM Please see Amion for pager number during day hours 7:00am-4:30pm

## 2019-11-29 NOTE — Plan of Care (Signed)
  Problem: Clinical Measurements: Goal: Cardiovascular complication will be avoided Outcome: Progressing   Problem: Clinical Measurements: Goal: Respiratory complications will improve Outcome: Progressing   Problem: Clinical Measurements: Goal: Ability to maintain clinical measurements within normal limits will improve Outcome: Progressing   

## 2019-11-29 NOTE — TOC Progression Note (Addendum)
Transition of Care [facility]) - Progression Note    Patient Details  Name: Nathan [name] MRN: [id number] Date of Birth: [birth year]  Transition of Care [facility]) CM/SW Contact  Oren Section Cleta Alberts, RN Phone Number: 11/29/2019, 4:25 PM  Clinical Narrative: [facility] [place] is not in contract with [place], and does not accept secondary insurance.  Pt now requesting [facility], but they have no bed availability until sometime next week.  Met with pt and wife (on speaker phone); they do not want CIR at [facility], as they state it is too far away from home.  They have decided to go with [facility], which is approximately 25 min from patient's home, and they are contracted with pt's [place] Medicare.  Submitted for insurance authorization through [facility]; faxed clinical information to [phone].  Reference # is [id number].  No repeat Covid needed, per [facility] with [facility].        Expected Discharge Plan: Skilled Nursing Facility Barriers to Discharge: Ship broker  Expected Discharge Plan and Services Expected Discharge Plan: [place]   Discharge Planning Services: CM Consult Post Acute Care Choice: [place] Living arrangements for the past 2 months: Single Family Home                                       Social Determinants of Health (SDOH) Interventions    Readmission Risk Interventions No flowsheet data found.  Reinaldo Raddle, RN, BSN  Trauma/Neuro ICU Case Manager [phone]

## 2019-11-29 NOTE — Progress Notes (Signed)
  Speech Language Pathology Treatment: Cognitive-Linquistic  Patient Details Name: Nathan [name] MRN: [id number] DOB: [birth year] Today's Date: 11/29/2019 Time: [id number] SLP Time Calculation (min) (ACUTE ONLY): 18 min  Assessment / Plan / Recommendation Clinical Impression  Pt was seen for cognitive-linguistic treatment and was cooperative throughout the session. He reported that his memory may be different compared to his baseline but stated that he believes he is improving. Pt was educated regarding the results of the cognitive-linguistic evaluation and plan of care; understanding and agreement were verbalized. He initially demonstrated 20% accuracy with recall of concrete information from recorded voicemails increasing to 40% with choice cues. He was then educated regarding compensatory strategies for memory and with use of these strategies his accuracy improved to 60% for recall of information from additional voicemails increasing to 100% with choice cues. He completed time management problems with 75% accuracy increasing to 100% with repetition and additional processing time. He demonstrated 100% accuracy with an executive function prescription task. SLP will continue to follow pt.    HPI HPI: 74 y.o.m came in as a level 2 trauma after falling down stairs. Injuries include bilateral rib fractures, C7 fracture, T12 fracture, and left scapular fracture.  CT of his head showed: Hemorrhagic contusion of the inferolateral Rt temporal lobe; small volume SAH anterior Lt temporal lobe, nondisplaced frx of the temporal and sphenoid bone; fx zygomatic arch, and minimally displaced fx Lt maxillary sinus. Pt with history of seizures and DVT.       SLP Plan  Continue with current plan of care       Recommendations                   Follow up Recommendations: Inpatient Rehab Plan: Continue with current plan of care       Taneshia Lorence I. Hardin Negus, [place], [place] Office number [phone] Pager [phone]                 Nathan [name] 11/29/2019, 5:42 PM

## 2019-11-30 NOTE — TOC Transition Note (Signed)
Transition of Care [facility]) - CM/SW Discharge Note   Patient Details  Name: Nathan [name] MRN: [id number] Date of Birth: Oct 23, 1945  Transition of Care [facility]) CM/SW Contact:  Ella Bodo, RN Phone Number: 11/30/2019, 12:16 PM   Clinical Narrative: Received authorization from [facility] for admission to [facility], ref # [id number]. Pt approved for 5 days, starting on 11/30/19; next review date is 12/04/19. Please fax clinicals to [phone].  Notified patient and provider of auth and that facility can accept today.  Bedside nurse notified of discharge to SNF, and to call report to [phone], 400 hall nurse.    PTAR notified for transport at 10:26am.         Final next level of care: Skilled Nursing Facility Barriers to Discharge: Barriers Resolved   Patient Goals and CMS Choice Patient states their goals for this hospitalization and ongoing recovery are:: to get better CMS Medicare.gov Compare Post Acute Care list provided to:: Patient Choice offered to / list presented to : Patient, Spouse  Discharge Placement PASRR number recieved: 11/26/19            Patient chooses bed at: [facility] Patient to be transferred to facility by: PTAR; called for transport at 10:26am Name of family member notified: Philippos Roti, wife Patient and family notified of of transfer: 11/30/19  Discharge Plan and Services   Discharge Planning Services: CM Consult Post Acute Care Choice: [place]                               Social Determinants of Health (SDOH) Interventions     Readmission Risk Interventions No flowsheet data found.  Reinaldo Raddle, RN, BSN  Trauma/Neuro ICU Case Manager [phone]

## 2019-11-30 NOTE — Progress Notes (Signed)
Patient discharged to [facility] for rehab. Report called to [employer] [address] hall to [place], [place]. Full report given and all questions answered.   PTAR here at this time to transport patient to rehab facility. Report given to PTAR.  Patient belongings secured in patient belongings bag. Cell phone charger, sling, IS, flutter valve, and a nickel. Patient is wearing his glasses at discharge.

## 2019-12-03 ENCOUNTER — Encounter: Payer: Self-pay | Admitting: Allergy and Immunology

## 2020-01-31 ENCOUNTER — Other Ambulatory Visit: Payer: Self-pay | Admitting: Neurosurgery

## 2020-01-31 DIAGNOSIS — S062X9D Diffuse traumatic brain injury with loss of consciousness of unspecified duration, subsequent encounter: Secondary | ICD-10-CM

## 2020-03-31 ENCOUNTER — Emergency Department (HOSPITAL_COMMUNITY): Payer: No Typology Code available for payment source

## 2020-03-31 ENCOUNTER — Inpatient Hospital Stay (HOSPITAL_COMMUNITY)
Admission: EM | Admit: 2020-03-31 | Discharge: 2020-04-11 | DRG: 872 | Disposition: A | Discharge status: Skilled Nursing Facility | Payer: No Typology Code available for payment source | Attending: Family Medicine | Admitting: Family Medicine

## 2020-03-31 ENCOUNTER — Other Ambulatory Visit: Payer: Self-pay

## 2020-03-31 ENCOUNTER — Encounter (HOSPITAL_COMMUNITY): Payer: Self-pay | Admitting: *Deleted

## 2020-03-31 DIAGNOSIS — R739 Hyperglycemia, unspecified: Secondary | ICD-10-CM | POA: Diagnosis present

## 2020-03-31 DIAGNOSIS — A419 Sepsis, unspecified organism: Secondary | ICD-10-CM

## 2020-03-31 DIAGNOSIS — I10 Essential (primary) hypertension: Secondary | ICD-10-CM | POA: Diagnosis present

## 2020-03-31 DIAGNOSIS — Z8673 Personal history of transient ischemic attack (TIA), and cerebral infarction without residual deficits: Secondary | ICD-10-CM

## 2020-03-31 DIAGNOSIS — A4150 Gram-negative sepsis, unspecified: Principal | ICD-10-CM | POA: Diagnosis present

## 2020-03-31 DIAGNOSIS — T421X5A Adverse effect of iminostilbenes, initial encounter: Secondary | ICD-10-CM | POA: Diagnosis present

## 2020-03-31 DIAGNOSIS — Z96652 Presence of left artificial knee joint: Secondary | ICD-10-CM | POA: Diagnosis present

## 2020-03-31 DIAGNOSIS — F17210 Nicotine dependence, cigarettes, uncomplicated: Secondary | ICD-10-CM | POA: Diagnosis present

## 2020-03-31 DIAGNOSIS — D649 Anemia, unspecified: Secondary | ICD-10-CM | POA: Diagnosis present

## 2020-03-31 DIAGNOSIS — E222 Syndrome of inappropriate secretion of antidiuretic hormone: Secondary | ICD-10-CM | POA: Diagnosis present

## 2020-03-31 DIAGNOSIS — Z888 Allergy status to other drugs, medicaments and biological substances status: Secondary | ICD-10-CM

## 2020-03-31 DIAGNOSIS — D75839 Thrombocytosis, unspecified: Secondary | ICD-10-CM | POA: Diagnosis present

## 2020-03-31 DIAGNOSIS — Z86718 Personal history of other venous thrombosis and embolism: Secondary | ICD-10-CM

## 2020-03-31 DIAGNOSIS — E871 Hypo-osmolality and hyponatremia: Secondary | ICD-10-CM | POA: Diagnosis present

## 2020-03-31 DIAGNOSIS — N179 Acute kidney failure, unspecified: Secondary | ICD-10-CM | POA: Diagnosis present

## 2020-03-31 DIAGNOSIS — R55 Syncope and collapse: Secondary | ICD-10-CM | POA: Diagnosis not present

## 2020-03-31 DIAGNOSIS — F4310 Post-traumatic stress disorder, unspecified: Secondary | ICD-10-CM | POA: Diagnosis present

## 2020-03-31 DIAGNOSIS — K5010 Crohn's disease of large intestine without complications: Secondary | ICD-10-CM

## 2020-03-31 DIAGNOSIS — D72823 Leukemoid reaction: Secondary | ICD-10-CM | POA: Diagnosis present

## 2020-03-31 DIAGNOSIS — R569 Unspecified convulsions: Secondary | ICD-10-CM

## 2020-03-31 DIAGNOSIS — D509 Iron deficiency anemia, unspecified: Secondary | ICD-10-CM | POA: Diagnosis present

## 2020-03-31 DIAGNOSIS — Z79899 Other long term (current) drug therapy: Secondary | ICD-10-CM

## 2020-03-31 DIAGNOSIS — K219 Gastro-esophageal reflux disease without esophagitis: Secondary | ICD-10-CM | POA: Diagnosis present

## 2020-03-31 DIAGNOSIS — E872 Acidosis, unspecified: Secondary | ICD-10-CM | POA: Diagnosis present

## 2020-03-31 DIAGNOSIS — Z7901 Long term (current) use of anticoagulants: Secondary | ICD-10-CM

## 2020-03-31 DIAGNOSIS — K449 Diaphragmatic hernia without obstruction or gangrene: Secondary | ICD-10-CM | POA: Diagnosis present

## 2020-03-31 DIAGNOSIS — Z20822 Contact with and (suspected) exposure to covid-19: Secondary | ICD-10-CM | POA: Diagnosis present

## 2020-03-31 DIAGNOSIS — R195 Other fecal abnormalities: Secondary | ICD-10-CM

## 2020-03-31 DIAGNOSIS — Z7401 Bed confinement status: Secondary | ICD-10-CM

## 2020-03-31 DIAGNOSIS — Z96611 Presence of right artificial shoulder joint: Secondary | ICD-10-CM | POA: Diagnosis present

## 2020-03-31 DIAGNOSIS — R6520 Severe sepsis without septic shock: Secondary | ICD-10-CM

## 2020-03-31 DIAGNOSIS — E860 Dehydration: Secondary | ICD-10-CM | POA: Diagnosis present

## 2020-03-31 DIAGNOSIS — E785 Hyperlipidemia, unspecified: Secondary | ICD-10-CM | POA: Diagnosis present

## 2020-03-31 DIAGNOSIS — F17290 Nicotine dependence, other tobacco product, uncomplicated: Secondary | ICD-10-CM | POA: Diagnosis present

## 2020-03-31 LAB — CBC WITH DIFFERENTIAL/PLATELET
Abs Immature Granulocytes: 1.38 10*3/uL — ABNORMAL HIGH (ref 0.00–0.07)
Basophils Absolute: 0.1 10*3/uL (ref 0.0–0.1)
Basophils Relative: 0 %
Eosinophils Absolute: 0 10*3/uL (ref 0.0–0.5)
Eosinophils Relative: 0 %
HCT: 31.9 % — ABNORMAL LOW (ref 39.0–52.0)
Hemoglobin: 10 g/dL — ABNORMAL LOW (ref 13.0–17.0)
Immature Granulocytes: 3 %
Lymphocytes Relative: 3 %
Lymphs Abs: 1.1 10*3/uL (ref 0.7–4.0)
MCH: 26.5 pg (ref 26.0–34.0)
MCHC: 31.3 g/dL (ref 30.0–36.0)
MCV: 84.6 fL (ref 80.0–100.0)
Monocytes Absolute: 1.2 10*3/uL — ABNORMAL HIGH (ref 0.1–1.0)
Monocytes Relative: 3 %
Neutro Abs: 39.1 10*3/uL — ABNORMAL HIGH (ref 1.7–7.7)
Neutrophils Relative %: 91 %
Platelets: 783 10*3/uL — ABNORMAL HIGH (ref 150–400)
RBC: 3.77 MIL/uL — ABNORMAL LOW (ref 4.22–5.81)
RDW: 17 % — ABNORMAL HIGH (ref 11.5–15.5)
WBC Morphology: INCREASED
WBC: 42.8 10*3/uL — ABNORMAL HIGH (ref 4.0–10.5)
nRBC: 0 % (ref 0.0–0.2)

## 2020-03-31 LAB — COMPREHENSIVE METABOLIC PANEL
ALT: 15 U/L (ref 0–44)
AST: 54 U/L — ABNORMAL HIGH (ref 15–41)
Albumin: 2.8 g/dL — ABNORMAL LOW (ref 3.5–5.0)
Alkaline Phosphatase: 87 U/L (ref 38–126)
Anion gap: 19 — ABNORMAL HIGH (ref 5–15)
BUN: 21 mg/dL (ref 8–23)
CO2: 16 mmol/L — ABNORMAL LOW (ref 22–32)
Calcium: 9.1 mg/dL (ref 8.9–10.3)
Chloride: 92 mmol/L — ABNORMAL LOW (ref 98–111)
Creatinine, Ser: 1.55 mg/dL — ABNORMAL HIGH (ref 0.61–1.24)
GFR calc Af Amer: 50 mL/min — ABNORMAL LOW (ref >60)
GFR calc non Af Amer: 43 mL/min — ABNORMAL LOW (ref >60)
Glucose, Bld: 143 mg/dL — ABNORMAL HIGH (ref 70–99)
Potassium: 4.5 mmol/L (ref 3.5–5.1)
Sodium: 127 mmol/L — ABNORMAL LOW (ref 135–145)
Total Bilirubin: 1 mg/dL (ref 0.3–1.2)
Total Protein: 7.6 g/dL (ref 6.5–8.1)

## 2020-03-31 LAB — BLOOD GAS, VENOUS
Acid-base deficit: 7.2 mmol/L — ABNORMAL HIGH (ref 0.0–2.0)
Bicarbonate: 18.7 mmol/L — ABNORMAL LOW (ref 20.0–28.0)
Drawn by: 1528
FIO2: 28
O2 Saturation: 90.2 %
Patient temperature: 37
pCO2, Ven: 30.8 mmHg — ABNORMAL LOW (ref 44.0–60.0)
pH, Ven: 7.365 (ref 7.250–7.430)
pO2, Ven: 67.1 mmHg — ABNORMAL HIGH (ref 32.0–45.0)

## 2020-03-31 LAB — PROTIME-INR
INR: 1.8 — ABNORMAL HIGH (ref 0.8–1.2)
Prothrombin Time: 19.8 seconds — ABNORMAL HIGH (ref 11.4–15.2)

## 2020-03-31 LAB — LACTIC ACID, PLASMA
Lactic Acid, Venous: 8 mmol/L (ref 0.5–1.9)
Lactic Acid, Venous: 5.7 mmol/L (ref 0.5–1.9)

## 2020-03-31 LAB — LIPASE, BLOOD: Lipase: 41 U/L (ref 11–51)

## 2020-03-31 LAB — APTT: aPTT: 36 seconds (ref 24–36)

## 2020-03-31 LAB — SARS CORONAVIRUS 2 BY RT PCR (HOSPITAL ORDER, PERFORMED IN ~~LOC~~ HOSPITAL LAB): SARS Coronavirus 2: NEGATIVE

## 2020-03-31 MED ORDER — VANCOMYCIN HCL IN DEXTROSE 1-5 GM/200ML-% IV SOLN: 1000.0000 mg | Freq: Once | INTRAVENOUS | Status: DC

## 2020-03-31 MED ORDER — ONDANSETRON HCL 4 MG/2ML IJ SOLN
4.0000 mg | Freq: Once | INTRAMUSCULAR | Status: AC
  Administered 2020-03-31: 4 mg via INTRAVENOUS
  Filled 2020-03-31: qty 2

## 2020-03-31 MED ORDER — VANCOMYCIN HCL 2000 MG/400ML IV SOLN
2000.0000 mg | Freq: Once | INTRAVENOUS | Status: AC
  Administered 2020-03-31: 2000 mg via INTRAVENOUS
  Filled 2020-03-31: qty 400

## 2020-03-31 MED ORDER — METRONIDAZOLE IN NACL 5-0.79 MG/ML-% IV SOLN
500.0000 mg | Freq: Once | INTRAVENOUS | Status: AC
  Administered 2020-03-31: 500 mg via INTRAVENOUS
  Filled 2020-03-31: qty 100

## 2020-03-31 MED ORDER — SODIUM CHLORIDE 0.9 % IV BOLUS (SEPSIS)
2000.0000 mL | Freq: Once | INTRAVENOUS | Status: AC
  Administered 2020-03-31: 2000 mL via INTRAVENOUS

## 2020-03-31 MED ORDER — SODIUM CHLORIDE 0.9 % IV SOLN
2.0000 g | Freq: Two times a day (BID) | INTRAVENOUS | Status: DC
  Administered 2020-03-31 – 2020-04-02 (×5): 2 g via INTRAVENOUS
  Filled 2020-03-31 (×4): qty 2

## 2020-03-31 MED ORDER — SODIUM CHLORIDE 0.9 % IV BOLUS (SEPSIS)
1000.0000 mL | Freq: Once | INTRAVENOUS | Status: AC
  Administered 2020-03-31: 1000 mL via INTRAVENOUS

## 2020-03-31 MED ORDER — VANCOMYCIN HCL 750 MG/150ML IV SOLN
750.0000 mg | Freq: Two times a day (BID) | INTRAVENOUS | Status: DC
  Administered 2020-04-01: 750 mg via INTRAVENOUS
  Filled 2020-03-31 (×2): qty 150

## 2020-03-31 MED ORDER — SODIUM CHLORIDE 0.9 % IV SOLN
2.0000 g | Freq: Once | INTRAVENOUS | Status: DC
  Filled 2020-03-31: qty 2

## 2020-03-31 MED ORDER — IOHEXOL 300 MG/ML  SOLN
75.0000 mL | Freq: Once | INTRAMUSCULAR | Status: AC | PRN
  Administered 2020-03-31: 75 mL via INTRAVENOUS

## 2020-03-31 NOTE — ED Provider Notes (Signed)
[facility] EMERGENCY DEPARTMENT Provider Note   CSN: [id number] Arrival date & time: 03/31/20  1747     History Chief Complaint  Patient presents with  . Hypotension    Nathan [name] is a 74 y.o. male.  Nathan [name] is a 74 y.o. male with a history of hypertension, hyperlipidemia, GERD, DVT, seizures, TBI, who presents to the emergency department via EMS for evaluation of hypotension and tachycardia.  Syncopal episode reported and patient reports feeling dizzy and lightheaded when trying to stand up.  EMS reported a blood pressure of 70/40, improved to 97/58 on arrival.  Patient's primary complaint is feeling generally weak and lightheaded, and that he is having diffuse abdominal pain and has not been able to have a bowel movement. Initially he states he is not been able to have a bowel movement in 3 days but then states he thinks it has been more like a week and he has not been feeling well. On arrival he is uncomfortable and having difficulty sitting still, asking for an enema because he needs to have a bowel movement to relieve his abdominal pain.  He states that he has not been able to keep anything down and has had some nonbloody emesis today, but did not have vomiting on any of the other days. He denies any chest pain or shortness of breath. He has not had any cough. Denies fevers or chills. States he has had his Covid vaccines. Denies dysuria.  I also called and spoke to the patient's wife who states that he only mentioned today that he was not feeling well to her, had 1 episode of nonbloody emesis and then an hour later seem to have an episode where he lost consciousness and had some brief shaking lasting about 30 seconds, she was worried this may have been a seizure which she has a prior history of. She states that he had a similar episode with EMS that was more brief but they felt this was more related to syncope due to low blood pressure        Past Medical History:  Diagnosis  Date  . DVT (deep venous thrombosis) ([place])   . GERD (gastroesophageal reflux disease)   . HTN (hypertension)   . Hyperlipidemia   . Hypertension   . PTSD (post-traumatic stress disorder)   . Renal disorder   . Seizure ([place])    last one 2004  . Seizures [facility])     Patient Active Problem List   Diagnosis Date Noted  . Fall down stairs 11/25/2019  . Multiple rib fractures 11/25/2019  . Traumatic closed fracture of distal clavicle with minimal displacement, left, initial encounter 11/25/2019  . TBI (traumatic brain injury) ([place]) 11/22/2019  . Recurrent pruritus/urticaria 04/25/2018  . Seasonal and perennial allergic rhinitis 04/25/2018  . Wheezing/dyspnea 04/25/2018  . Coughing 04/25/2018  . Acute encephalopathy 09/30/2017  . BPH (benign prostatic hyperplasia) 09/30/2017  . 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 CVA (cerebral vascular accident) ([place]) 05/20/2016  . Word finding difficulty   . Bacteremia 05/19/2016  . Fever 05/18/2016  . Headache 05/18/2016  . Leukocytosis 05/18/2016  . Hypokalemia 03/22/2013  . Sepsis ([place]) 03/21/2013  . Thrombocytopenia ([place]) 03/21/2013  . Acidosis 03/21/2013  . Tachycardia 03/21/2013  . UTI (lower urinary tract infection) 03/21/2013  . Hyponatremia 03/21/2013  . Seizure ([place])   . PTSD (post-traumatic stress disorder)   . HTN (hypertension)   . GERD (gastroesophageal reflux disease)     Past Surgical History:  Procedure Laterality Date  . JOINT REPLACEMENT     Left knee, R shoulder  . SHOULDER ARTHROSCOPY Left 2016       Family History  Family history unknown: Yes    Social History   Tobacco Use  . Smoking status: Current Every Day Smoker    Years: 20.00    Types: Pipe  . Smokeless tobacco: Never Used  Vaping Use  . Vaping Use: Never used  Substance Use Topics  . Alcohol use: No  . Drug use: No    Home Medications Prior to Admission medications   Medication Sig Start Date End Date Taking? Authorizing Provider   acetaminophen (TYLENOL) 325 MG tablet Take 2 tablets (650 mg total) by mouth every 6 (six) hours. 11/28/19   Norm Parcel, PA-C  albuterol (PROVENTIL HFA;VENTOLIN HFA) 108 (90 Base) MCG/ACT inhaler Inhale 2 puffs into the lungs every 6 (six) hours as needed for wheezing or shortness of breath. 04/25/18   Bobbitt, Sed[place] Muta, MD  apixaban (ELIQUIS) 2.5 MG TABS tablet Take 2.5 mg by mouth 2 (two) times daily.    [provider]  apixaban (ELIQUIS) 5 MG TABS tablet Take 2 tablets (10 mg total) by mouth in the morning and at bedtime. 12/06/19   Norm Parcel, PA-C  ARIPiprazole (ABILIFY) 15 MG tablet Take 7.5 mg by mouth daily.    [provider]  ARIPiprazole (ABILIFY) 20 MG tablet Take 20 mg by mouth daily.     [provider]  azelastine (ASTELIN) 0.1 % nasal spray Place 2 sprays into both nostrils 2 (two) times daily. 04/25/18   Bobbitt, Sedalia Muta, MD  cetirizine (ZYRTEC) 10 MG tablet Take 10 mg by mouth daily.    [provider]  diazepam (VALIUM) 5 MG tablet Take 5 mg by mouth daily.     [provider]  docusate sodium (COLACE) 100 MG capsule Take 1 capsule (100 mg total) by mouth 2 (two) times daily. 11/29/19   Jill Alexanders, PA-C  enoxaparin (LOVENOX) 30 MG/0.3ML injection Inject 0.3 mLs (30 mg total) into the skin every 12 (twelve) hours for 8 days. 11/28/19 12/06/19  Norm Parcel, PA-C  famotidine (PEPCID) 20 MG tablet Take 1 tablet (20 mg total) by mouth 2 (two) times daily. 04/25/18   Bobbitt, Sedalia Muta, MD  feeding supplement, ENSURE ENLIVE, (ENSURE ENLIVE) LIQD Take 237 mLs by mouth 3 (three) times daily between meals. 11/28/19   Norm Parcel, PA-C  fexofenadine (ALLEGRA) 180 MG tablet Take 1 tablet (180 mg total) by mouth daily. 04/25/18   Bobbitt, Sedalia Muta, MD  fluticasone (FLONASE) 50 MCG/ACT nasal spray Place 1 spray into both nostrils daily. 10/02/17   Barton Dubois, MD  fluticasone (FLONASE) 50 MCG/ACT nasal spray Place  2 sprays into both nostrils daily.    [provider]  guaiFENesin (ROBITUSSIN) 100 MG/5ML SOLN Take 10 mLs (200 mg total) by mouth every 4 (four) hours. 11/28/19   Norm Parcel, PA-C  levETIRAcetam (KEPPRA) 500 MG tablet Take 1 tablet (500 mg total) by mouth 2 (two) times daily for 8 days. 11/28/19 12/06/19  Norm Parcel, PA-C  lisinopril (PRINIVIL,ZESTRIL) 40 MG tablet Take 0.5 tablets (20 mg total) by mouth every morning. 10/02/17   Barton Dubois, MD  lisinopril (ZESTRIL) 10 MG tablet Take 10 mg by mouth daily.    [provider]  loratadine (CLARITIN) 10 MG tablet Take 1 tablet (10 mg total) by mouth daily. 11/29/19   Wynetta Emery,  D[place] Sewer, PA-C  methocarbamol (ROBAXIN) 500 MG tablet Take 1 tablet (500 mg total) by mouth every 6 (six) hours. 11/28/19   Norm Parcel, PA-C  metoprolol succinate (TOPROL-XL) 25 MG 24 hr tablet Take 25 mg by mouth 2 (two) times daily.     [provider]  metoprolol tartrate (LOPRESSOR) 50 MG tablet Take 50 mg by mouth 2 (two) times daily.    [provider]  Multiple Vitamin (MULTIVITAMIN WITH MINERALS) TABS tablet Take 1 tablet by mouth at bedtime.     [provider]  Multiple Vitamin (MULTIVITAMIN WITH MINERALS) TABS tablet Take 1 tablet by mouth daily.    [provider]  Omega-3 Fatty Acids (OMEGA 3 500 PO) Take 1,000 mg by mouth daily.    [provider]  omeprazole (PRILOSEC) 10 MG capsule Take 10 mg by mouth daily.    [provider]  omeprazole (PRILOSEC) 20 MG capsule Take 20 mg by mouth daily.    [provider]  Oxcarbazepine (TRILEPTAL) 300 MG tablet Take 1 tablet (300 mg total) by mouth 2 (two) times daily. 10/02/17   Barton Dubois, MD  oxyCODONE 10 MG TABS Take 0.5-1 tablets (5-10 mg total) by mouth every 4 (four) hours as needed for moderate pain or severe pain. 11/28/19   Norm Parcel, PA-C  polyethylene glycol (MIRALAX / GLYCOLAX) 17 g packet Take 17 g by mouth daily.  11/29/19   Jill Alexanders, PA-C  simvastatin (ZOCOR) 40 MG tablet Take 20 mg by mouth at bedtime.     [provider]  simvastatin (ZOCOR) 5 MG tablet Take 2.5 mg by mouth daily.    [provider]  sodium chloride (OCEAN) 0.65 % SOLN nasal spray Place 1 spray into both nostrils as needed for congestion. 11/28/19   Norm Parcel, PA-C  tamsulosin (FLOMAX) 0.4 MG CAPS capsule Take 2 capsules (0.8 mg total) by mouth at bedtime. 10/02/17   Barton Dubois, MD  venlafaxine (EFFEXOR) 100 MG tablet Take 300 mg by mouth daily.    [provider]  venlafaxine XR (EFFEXOR-XR) 150 MG 24 hr capsule Take 150 mg by mouth at bedtime.     [provider]  zolpidem (AMBIEN) 10 MG tablet Take 10 mg by mouth at bedtime as needed for sleep. Max 3-5 tablets per week.    [provider]    Allergies    Terazosin and Terazosin  Review of Systems   Review of Systems  Constitutional: Positive for fatigue. Negative for chills and fever.  HENT: Negative.   Eyes: Negative for visual disturbance.  Respiratory: Negative for cough and shortness of breath.   Cardiovascular: Negative for chest pain.  Gastrointestinal: Positive for abdominal pain, constipation, nausea and vomiting. Negative for blood in stool.  Genitourinary: Negative for dysuria.  Musculoskeletal: Negative for arthralgias and myalgias.  Skin: Negative for color change and rash.  Neurological: Positive for syncope, weakness (Generalized) and light-headedness. Negative for dizziness and numbness.    Physical Exam Updated Vital Signs BP 90/62 (BP Location: Right Arm)   Pulse 98   Temp 98.3 F (36.8 C) (Rectal)   Resp 20   Ht 5\' 11"  (1.803 m)   Wt 98 kg   SpO2 98%   BMI 30.13 kg/m   Physical Exam Vitals and nursing note reviewed.  Constitutional:      General: He is not in acute distress.    Appearance: Normal appearance. He is well-developed. He is ill-appearing. He is  not toxic-appearing or  diaphoretic.     Comments: Elderly gentleman, mildly ill-appearing but in no acute distress  HENT:     Head: Normocephalic and atraumatic.     Mouth/Throat:     Comments: Mucous membranes are dry, lips are dry and cracked, posterior oropharynx clear Eyes:     General:        Right eye: No discharge.        Left eye: No discharge.     Extraocular Movements: Extraocular movements intact.     Pupils: Pupils are equal, round, and reactive to light.  Cardiovascular:     Rate and Rhythm: Regular rhythm. Tachycardia present.     Heart sounds: Normal heart sounds. No murmur heard.  No friction rub. No gallop.      Comments: Tachycardia with regular rhythm Pulmonary:     Effort: Pulmonary effort is normal. No respiratory distress.     Breath sounds: Normal breath sounds. No wheezing or rales.     Comments: Respirations equal and unlabored, patient able to speak in full sentences, lungs clear to auscultation bilaterally with slightly decreased breath sounds in bilateral bases Abdominal:     General: There is distension.     Palpations: Abdomen is soft. There is no mass.     Tenderness: There is abdominal tenderness. There is no guarding.     Comments: Abdomen is mildly distended with generalized tenderness throughout the does not localize, no guarding or rigidity.  Bowel sounds present throughout but hypoactive.  Musculoskeletal:        General: No deformity.     Cervical back: Neck supple.     Right lower leg: No edema.     Left lower leg: No edema.  Skin:    General: Skin is warm and dry.     Capillary Refill: Capillary refill takes less than 2 seconds.     Findings: No erythema or rash.  Neurological:     Mental Status: He is alert.     Coordination: Coordination normal.     Comments: Speech is clear, able to follow commands Moves extremities without ataxia, coordination intact  Psychiatric:        Mood and Affect: Mood normal.        Behavior: Behavior normal.     ED Results /  Procedures / Treatments   Labs (all labs ordered are listed, but only abnormal results are displayed) Labs Reviewed  LACTIC ACID, PLASMA - Abnormal; Notable for the following components:      Result Value   Lactic Acid, Venous 8.0 (*)    All other components within normal limits  COMPREHENSIVE METABOLIC PANEL - Abnormal; Notable for the following components:   Sodium 127 (*)    Chloride 92 (*)    CO2 16 (*)    Glucose, Bld 143 (*)    Creatinine, Ser 1.55 (*)    Albumin 2.8 (*)    AST 54 (*)    GFR calc non Af Amer 43 (*)    GFR calc Af Amer 50 (*)    Anion gap 19 (*)    All other components within normal limits  CBC WITH DIFFERENTIAL/PLATELET - Abnormal; Notable for the following components:   WBC 42.8 (*)    RBC 3.77 (*)    Hemoglobin 10.0 (*)    HCT 31.9 (*)    RDW 17.0 (*)    Platelets 783 (*)    Neutro Abs 39.1 (*)    Monocytes Absolute 1.2 (*)  Abs Immature Granulocytes 1.38 (*)    All other components within normal limits  PROTIME-INR - Abnormal; Notable for the following components:   Prothrombin Time 19.8 (*)    INR 1.8 (*)    All other components within normal limits  CULTURE, BLOOD (SINGLE)  SARS CORONAVIRUS 2 BY RT PCR (HOSPITAL ORDER, [place] [facility])  URINE CULTURE  CULTURE, BLOOD (SINGLE)  APTT  LIPASE, BLOOD  LACTIC ACID, PLASMA  URINALYSIS, ROUTINE W REFLEX MICROSCOPIC    EKG EKG Interpretation  Date/Time:  Monday March 31 2020 19:39:05 EDT Ventricular Rate:  99 PR Interval:    QRS Duration: 86 QT Interval:  348 QTC Calculation: 447 R Axis:   40 Text Interpretation: Sinus rhythm Abnormal R-wave progression, early transition Confirmed by Fredia Sorrow [phone]) on 03/31/2020 8:00:42 PM   Radiology DG Abdomen Acute W/Chest  Result Date: 03/31/2020 CLINICAL DATA:  Hypotension and tachycardia. Evaluate for bowel obstruction. EXAM: DG ABDOMEN ACUTE W/ 1V CHEST COMPARISON:  11/23/2019 chest radiograph FINDINGS: Frontal  view of the chest demonstrates midline trachea. Normal heart size. Atherosclerosis in the transverse aorta. No pleural effusion or pneumothorax. Clear lungs. Abdominal films demonstrate gas and stool filled colon, including up to 6.8 cm. Stool within the rectum at up to 6.0 cm. No small bowel dilatation. No abnormal abdominal calcifications. No appendicolith. IMPRESSION: Moderate amount of stool within the rectum, possibly representing constipation or fecal impaction. Borderline gaseous distension of the colon without small bowel obstruction. No acute process in the chest. Aortic Atherosclerosis (ICD10-I70.0). Electronically Signed   By: Abigail Miyamoto M.D.   On: 03/31/2020 19:30    Procedures .Critical Care Performed by: Jacqlyn Larsen, PA-C Authorized by: Jacqlyn Larsen, PA-C   Critical care provider statement:    Critical care time (minutes):  45   Critical care was necessary to treat or prevent imminent or life-threatening deterioration of the following conditions:  Sepsis   Critical care was time spent personally by me on the following activities:  Discussions with consultants, evaluation of patient's response to treatment, examination of patient, ordering and performing treatments and interventions, ordering and review of laboratory studies, ordering and review of radiographic studies, pulse oximetry, re-evaluation of patient's condition, obtaining history from patient or surrogate and review of old charts   (including critical care time)  Medications Ordered in ED Medications  sodium chloride 0.9 % bolus 2,000 mL (2,000 mLs Intravenous New Bag/Given 03/31/20 2035)  metroNIDAZOLE (FLAGYL) IVPB 500 mg (500 mg Intravenous New Bag/Given 03/31/20 2059)  vancomycin (VANCOREADY) IVPB 2000 mg/400 mL (has no administration in time range)  iohexol (OMNIPAQUE) 300 MG/ML solution 75 mL (has no administration in time range)  vancomycin (VANCOREADY) IVPB 750 mg/150 mL (has no administration in time range)   ceFEPIme (MAXIPIME) 2 g in sodium chloride 0.9 % 100 mL IVPB (2 g Intravenous New Bag/Given 03/31/20 2032)  sodium chloride 0.9 % bolus 1,000 mL (1,000 mLs Intravenous New Bag/Given 03/31/20 1938)  ondansetron (ZOFRAN) injection 4 mg (4 mg Intravenous Given 03/31/20 1938)    ED Course  I have reviewed the triage vital signs and the nursing notes.  Pertinent labs & imaging results that were available during my care of the patient were reviewed by me and considered in my medical decision making (see chart for details).    MDM Rules/Calculators/A&P                          74 year old  male presents via EMS for hypotension and tachycardia.  Also complaining of generalized abdominal pain and constipation with one episode of emesis today.  On arrival patient is tachycardic to 125, blood pressure of 97/58, he is afebrile and this is confirmed with rectal temp.  He has some mild generalized abdominal pain without peritoneal signs on exam and does not have other focal infectious symptoms.  Initiated orders for potential evolving sepsis, but patient also appears very dry and dehydrated and this alone could also cause hypotension and tachycardia so will hold off on starting antibiotics at this time until further information is provided.  Will give 1 L of IV fluids initially and monitor blood pressure closely.  IV Zofran given as well.  Patient was able to have a large bowel movement while here in the emergency department which was not bloody or melanotic, nursing staff report copious amounts of soft light brown stool.  He reports improvement in his pain after this but still has some abdominal tenderness on exam.  Tachycardia improving with fluids, BP remaining in the 90s.  Initial lab work shows significant leukocytosis of 42.8 with bandemia noted, hemoglobin of 10, thrombocytosis.  Mild hyponatremia of 127, likely related to dehydration, patient being given normal saline.  CO2 of 16, suspect this may be in the  setting of lactic acidosis.  Creatinine elevated from baseline of 1.8 today at 1.55, no significant derangements of LFTs.   Given significant leukocytosis without clear identified source full code sepsis initiated with additional 2 L of IV fluids given for 30 cc/kg fluid bolus and broad-spectrum antibiotics initiated.  Notified by nursing staff of critically elevated lactic acid at 8.0.  Blood pressures remaining stable with IV fluids, patient continues to be alert, and mentating well, despite significant lactic acidosis and leukocytosis.  Abdomen remains mildly tender but without peritoneal signs.   Acute abdominal x-ray with no evidence of pneumonia or acute cardiopulmonary disease.  There is a significant stool burden noted with some gaseous distention of the colon but no evidence of small bowel obstruction on x-ray.  CT of the abdomen ordered.  Spoke with patient's wife who expressed concern on whether patient had a seizure which he has prior history of versus syncopal episode.  Will also get CT scan of the head.  Patient with history of previous subarachnoid hemorrhage in the setting of trauma earlier this year.  His vitals are currently remaining stable.  The rest of his lab work including repeat lactic acid, urinalysis, and CTs of the abdomen pelvis, and head are pending at shift change.  Care signed out to Dr. Rogene Houston who will follow up on additional imaging, patient will require admission for sepsis and further treatment.  Final Clinical Impression(s) / ED Diagnoses Final diagnoses:  Severe sepsis [facility])    Rx / DC Orders ED Discharge Orders    None       Janet Berlin 03/31/20 2125    Fredia Sorrow, MD 03/31/20 [phone]

## 2020-03-31 NOTE — ED Notes (Signed)
Date and time results received: 03/31/20 2031  Test: lactic  Critical Value: 8.0  Name of Provider Notified: Merleen Nicely, [place]  Orders Received? Or Actions Taken?: acknowledged

## 2020-03-31 NOTE — ED Triage Notes (Signed)
Pt brought in by [facility] for c/o hypotension and tachycardia; pt had a syncopal episode; pt gets dizzy when standing; bp 70/40; cbg 200;  Pt c/o abdominal pain and states he has not had a BM in 3 days

## 2020-03-31 NOTE — Progress Notes (Addendum)
Pharmacy Antibiotic Note  Nathan [name] is a 74 y.o. male admitted on 03/31/2020 with sepsis, source unknown.  Pharmacy has been consulted for vancomycin and cefepime dosing.  WBC 42.8, afebrile, Scr 1.55 (baseline appears to be 0.9-0.97, per Epic records from earlier this year), CrCl 49.2 ml/min  Plan: Vancomycin 2 gm IV X 1, followed by vancomycin 750 mg IV Q 12 hrs, per [place] vancomycin protocol (goal vancomycin trough: 15-20 mg/L) Cefepime 2 gm IV Q 12 hrs Monitor WBC, temp, clinical improvement, cultures, vancomycin levels as indicated  Height: 5\' 11"  (180.3 cm) Weight: 98 kg (216 lb 0.8 oz) IBW/kg (Calculated) : 75.3  Temp (24hrs), Avg:98.1 F (36.7 C), Min:97.9 F (36.6 C), Max:98.3 F (36.8 C)  Recent Labs  Lab 03/31/20 1900  WBC 42.8*    CrCl cannot be calculated (Patient's most recent lab result is older than the maximum 21 days allowed.).    Allergies  Allergen Reactions  . Terazosin Other (See Comments)    Syncope  . Terazosin Other (See Comments)    unknown    Antimicrobials this admission: 9/6 vancomycin >>  9/6 cefepime >>  9/6 metronidazole   Microbiology results: 9/6 BCx X 1: pending 9/6 COVID: pending  Thank you for allowing pharmacy to be a part of this patient's care.  Gillermina Hu, PharmD, BCPS, [facility] Clinical Pharmacist 03/31/2020 7:57 PM

## 2020-04-01 ENCOUNTER — Encounter (HOSPITAL_COMMUNITY): Payer: Self-pay | Admitting: Internal Medicine

## 2020-04-01 DIAGNOSIS — Z20822 Contact with and (suspected) exposure to covid-19: Secondary | ICD-10-CM | POA: Diagnosis present

## 2020-04-01 DIAGNOSIS — D509 Iron deficiency anemia, unspecified: Secondary | ICD-10-CM | POA: Diagnosis present

## 2020-04-01 DIAGNOSIS — K5010 Crohn's disease of large intestine without complications: Secondary | ICD-10-CM | POA: Diagnosis not present

## 2020-04-01 DIAGNOSIS — I10 Essential (primary) hypertension: Secondary | ICD-10-CM | POA: Diagnosis present

## 2020-04-01 DIAGNOSIS — T421X5A Adverse effect of iminostilbenes, initial encounter: Secondary | ICD-10-CM | POA: Diagnosis present

## 2020-04-01 DIAGNOSIS — R569 Unspecified convulsions: Secondary | ICD-10-CM | POA: Diagnosis present

## 2020-04-01 DIAGNOSIS — D72825 Bandemia: Secondary | ICD-10-CM | POA: Diagnosis not present

## 2020-04-01 DIAGNOSIS — A4150 Gram-negative sepsis, unspecified: Secondary | ICD-10-CM | POA: Diagnosis present

## 2020-04-01 DIAGNOSIS — E872 Acidosis, unspecified: Secondary | ICD-10-CM | POA: Diagnosis present

## 2020-04-01 DIAGNOSIS — Z86718 Personal history of other venous thrombosis and embolism: Secondary | ICD-10-CM | POA: Diagnosis not present

## 2020-04-01 DIAGNOSIS — D649 Anemia, unspecified: Secondary | ICD-10-CM | POA: Diagnosis present

## 2020-04-01 DIAGNOSIS — R55 Syncope and collapse: Secondary | ICD-10-CM | POA: Diagnosis present

## 2020-04-01 DIAGNOSIS — N179 Acute kidney failure, unspecified: Secondary | ICD-10-CM

## 2020-04-01 DIAGNOSIS — E222 Syndrome of inappropriate secretion of antidiuretic hormone: Secondary | ICD-10-CM | POA: Diagnosis present

## 2020-04-01 DIAGNOSIS — E785 Hyperlipidemia, unspecified: Secondary | ICD-10-CM | POA: Diagnosis present

## 2020-04-01 DIAGNOSIS — F17210 Nicotine dependence, cigarettes, uncomplicated: Secondary | ICD-10-CM | POA: Diagnosis present

## 2020-04-01 DIAGNOSIS — Z7401 Bed confinement status: Secondary | ICD-10-CM | POA: Diagnosis not present

## 2020-04-01 DIAGNOSIS — E860 Dehydration: Secondary | ICD-10-CM | POA: Diagnosis present

## 2020-04-01 DIAGNOSIS — F17290 Nicotine dependence, other tobacco product, uncomplicated: Secondary | ICD-10-CM | POA: Diagnosis present

## 2020-04-01 DIAGNOSIS — Z7901 Long term (current) use of anticoagulants: Secondary | ICD-10-CM | POA: Diagnosis not present

## 2020-04-01 DIAGNOSIS — R739 Hyperglycemia, unspecified: Secondary | ICD-10-CM | POA: Diagnosis present

## 2020-04-01 DIAGNOSIS — A419 Sepsis, unspecified organism: Secondary | ICD-10-CM

## 2020-04-01 DIAGNOSIS — R6520 Severe sepsis without septic shock: Secondary | ICD-10-CM | POA: Diagnosis present

## 2020-04-01 DIAGNOSIS — D72823 Leukemoid reaction: Secondary | ICD-10-CM | POA: Diagnosis present

## 2020-04-01 DIAGNOSIS — F4310 Post-traumatic stress disorder, unspecified: Secondary | ICD-10-CM | POA: Diagnosis present

## 2020-04-01 DIAGNOSIS — K633 Ulcer of intestine: Secondary | ICD-10-CM | POA: Diagnosis not present

## 2020-04-01 DIAGNOSIS — K219 Gastro-esophageal reflux disease without esophagitis: Secondary | ICD-10-CM | POA: Diagnosis present

## 2020-04-01 DIAGNOSIS — Z79899 Other long term (current) drug therapy: Secondary | ICD-10-CM | POA: Diagnosis not present

## 2020-04-01 DIAGNOSIS — K449 Diaphragmatic hernia without obstruction or gangrene: Secondary | ICD-10-CM | POA: Diagnosis present

## 2020-04-01 DIAGNOSIS — D75839 Thrombocytosis, unspecified: Secondary | ICD-10-CM | POA: Diagnosis present

## 2020-04-01 DIAGNOSIS — R195 Other fecal abnormalities: Secondary | ICD-10-CM | POA: Diagnosis not present

## 2020-04-01 DIAGNOSIS — D473 Essential (hemorrhagic) thrombocythemia: Secondary | ICD-10-CM | POA: Diagnosis not present

## 2020-04-01 LAB — CBC WITH DIFFERENTIAL/PLATELET
Abs Immature Granulocytes: 1.77 10*3/uL — ABNORMAL HIGH (ref 0.00–0.07)
Basophils Absolute: 0.1 10*3/uL (ref 0.0–0.1)
Basophils Relative: 0 %
Eosinophils Absolute: 0 10*3/uL (ref 0.0–0.5)
Eosinophils Relative: 0 %
HCT: 27.6 % — ABNORMAL LOW (ref 39.0–52.0)
Hemoglobin: 8.8 g/dL — ABNORMAL LOW (ref 13.0–17.0)
Immature Granulocytes: 4 %
Lymphocytes Relative: 3 %
Lymphs Abs: 1.1 10*3/uL (ref 0.7–4.0)
MCH: 27.1 pg (ref 26.0–34.0)
MCHC: 31.9 g/dL (ref 30.0–36.0)
MCV: 84.9 fL (ref 80.0–100.0)
Monocytes Absolute: 1.3 10*3/uL — ABNORMAL HIGH (ref 0.1–1.0)
Monocytes Relative: 3 %
Neutro Abs: 38.2 10*3/uL — ABNORMAL HIGH (ref 1.7–7.7)
Neutrophils Relative %: 90 %
Platelets: 577 10*3/uL — ABNORMAL HIGH (ref 150–400)
RBC: 3.25 MIL/uL — ABNORMAL LOW (ref 4.22–5.81)
RDW: 17.2 % — ABNORMAL HIGH (ref 11.5–15.5)
WBC Morphology: INCREASED
WBC: 42.4 10*3/uL — ABNORMAL HIGH (ref 4.0–10.5)
nRBC: 0 % (ref 0.0–0.2)

## 2020-04-01 LAB — COMPREHENSIVE METABOLIC PANEL
ALT: 11 U/L (ref 0–44)
AST: 39 U/L (ref 15–41)
Albumin: 2.2 g/dL — ABNORMAL LOW (ref 3.5–5.0)
Alkaline Phosphatase: 70 U/L (ref 38–126)
Anion gap: 13 (ref 5–15)
BUN: 27 mg/dL — ABNORMAL HIGH (ref 8–23)
CO2: 17 mmol/L — ABNORMAL LOW (ref 22–32)
Calcium: 7.8 mg/dL — ABNORMAL LOW (ref 8.9–10.3)
Chloride: 99 mmol/L (ref 98–111)
Creatinine, Ser: 1.71 mg/dL — ABNORMAL HIGH (ref 0.61–1.24)
GFR calc Af Amer: 44 mL/min — ABNORMAL LOW (ref >60)
GFR calc non Af Amer: 38 mL/min — ABNORMAL LOW (ref >60)
Glucose, Bld: 122 mg/dL — ABNORMAL HIGH (ref 70–99)
Potassium: 5 mmol/L (ref 3.5–5.1)
Sodium: 129 mmol/L — ABNORMAL LOW (ref 135–145)
Total Bilirubin: 1.3 mg/dL — ABNORMAL HIGH (ref 0.3–1.2)
Total Protein: 6.2 g/dL — ABNORMAL LOW (ref 6.5–8.1)

## 2020-04-01 LAB — URINALYSIS, ROUTINE W REFLEX MICROSCOPIC
Bacteria, UA: NONE SEEN
Bilirubin Urine: NEGATIVE
Glucose, UA: 50 mg/dL — AB
Ketones, ur: NEGATIVE mg/dL
Leukocytes,Ua: NEGATIVE
Nitrite: NEGATIVE
Protein, ur: 30 mg/dL — AB
Specific Gravity, Urine: 1.017 (ref 1.005–1.030)
pH: 7 (ref 5.0–8.0)

## 2020-04-01 LAB — BASIC METABOLIC PANEL
Anion gap: 14 (ref 5–15)
BUN: 24 mg/dL — ABNORMAL HIGH (ref 8–23)
CO2: 16 mmol/L — ABNORMAL LOW (ref 22–32)
Calcium: 8 mg/dL — ABNORMAL LOW (ref 8.9–10.3)
Chloride: 98 mmol/L (ref 98–111)
Creatinine, Ser: 1.62 mg/dL — ABNORMAL HIGH (ref 0.61–1.24)
GFR calc Af Amer: 47 mL/min — ABNORMAL LOW (ref >60)
GFR calc non Af Amer: 41 mL/min — ABNORMAL LOW (ref >60)
Glucose, Bld: 109 mg/dL — ABNORMAL HIGH (ref 70–99)
Potassium: 4.5 mmol/L (ref 3.5–5.1)
Sodium: 128 mmol/L — ABNORMAL LOW (ref 135–145)

## 2020-04-01 LAB — MRSA PCR SCREENING: MRSA by PCR: NEGATIVE

## 2020-04-01 LAB — LACTIC ACID, PLASMA
Lactic Acid, Venous: 2.3 mmol/L (ref 0.5–1.9)
Lactic Acid, Venous: 4.1 mmol/L (ref 0.5–1.9)

## 2020-04-01 MED ORDER — ONDANSETRON HCL 4 MG/2ML IJ SOLN
4.0000 mg | Freq: Four times a day (QID) | INTRAMUSCULAR | Status: DC | PRN
  Administered 2020-04-02: 4 mg via INTRAVENOUS
  Filled 2020-04-01: qty 2

## 2020-04-01 MED ORDER — LACTATED RINGERS IV BOLUS (SEPSIS)
1000.0000 mL | Freq: Once | INTRAVENOUS | Status: AC
  Administered 2020-04-01: 1000 mL via INTRAVENOUS

## 2020-04-01 MED ORDER — ONDANSETRON HCL 4 MG PO TABS: 4.0000 mg | ORAL_TABLET | Freq: Four times a day (QID) | ORAL | Status: DC | PRN

## 2020-04-01 MED ORDER — SODIUM CHLORIDE 0.9 % IV SOLN: INTRAVENOUS | Status: AC

## 2020-04-01 MED ORDER — ACETAMINOPHEN 325 MG PO TABS
650.0000 mg | ORAL_TABLET | Freq: Four times a day (QID) | ORAL | Status: DC | PRN
  Administered 2020-04-03 – 2020-04-10 (×3): 650 mg via ORAL
  Filled 2020-04-01 (×3): qty 2

## 2020-04-01 MED ORDER — ACETAMINOPHEN 650 MG RE SUPP: 650.0000 mg | Freq: Four times a day (QID) | RECTAL | Status: DC | PRN

## 2020-04-01 MED ORDER — CHLORHEXIDINE GLUCONATE CLOTH 2 % EX PADS
6.0000 | MEDICATED_PAD | Freq: Every day | CUTANEOUS | Status: DC
  Administered 2020-04-02 – 2020-04-09 (×5): 6 via TOPICAL

## 2020-04-01 MED ORDER — METRONIDAZOLE IN NACL 5-0.79 MG/ML-% IV SOLN
500.0000 mg | Freq: Three times a day (TID) | INTRAVENOUS | Status: DC
  Administered 2020-04-01 – 2020-04-04 (×11): 500 mg via INTRAVENOUS
  Filled 2020-04-01 (×11): qty 100

## 2020-04-01 MED ORDER — BISACODYL 10 MG RE SUPP
10.0000 mg | Freq: Once | RECTAL | Status: AC
  Administered 2020-04-01: 10 mg via RECTAL
  Filled 2020-04-01: qty 1

## 2020-04-01 NOTE — TOC Initial Note (Signed)
Transition of Care [facility]) - Initial/Assessment Note    Patient Details  Name: Nathan [name] MRN: [id number] Date of Birth: 04-Mar-1946  Transition of Care [facility]) CM/SW Contact:    Shade Flood, LCSW Phone Number: 04/01/2020, 2:11 PM  Clinical Narrative:                  Pt admitted from home. He lives with his wife in [place]. It appears pt is independent in ADLs at baseline.   TOC submitted admission notification to [facility] through the portal.   TOC will follow and continue to assess and assist with dc planning.  Expected Discharge Plan: [place] Barriers to Discharge: Continued Medical Work up   Patient Goals and CMS Choice        Expected Discharge Plan and Services Expected Discharge Plan: [place]       Living arrangements for the past 2 months: Single Family Home                                      Prior Living Arrangements/Services Living arrangements for the past 2 months: Single Family Home Lives with:: Spouse Patient language and need for interpreter reviewed:: Yes Do you feel safe going back to the place where you live?: Yes      Need for Family Participation in Patient Care: Yes (Comment) Care giver support system in place?: Yes (comment)   Criminal Activity/Legal Involvement Pertinent to Current Situation/Hospitalization: No - Comment as needed  Activities of Daily Living Home Assistive Devices/Equipment: Cane (specify quad or straight), Walker (specify type), Blood pressure cuff ADL Screening (condition at time of admission) Patient's cognitive ability adequate to safely complete daily activities?: Yes Is the patient deaf or have difficulty hearing?: No Does the patient have difficulty seeing, even when wearing glasses/contacts?: No Does the patient have difficulty concentrating, remembering, or making decisions?: No Patient able to express need for assistance with ADLs?: Yes Does the patient have  difficulty dressing or bathing?: No Independently performs ADLs?: Yes (appropriate for developmental age) Does the patient have difficulty walking or climbing stairs?: Yes Weakness of Legs: Both Weakness of Arms/Hands: Both  Permission Sought/Granted                  Emotional Assessment       Orientation: : Oriented to Self, Oriented to Place, Oriented to  Time, Oriented to Situation Alcohol / Substance Use: Not Applicable Psych Involvement: No (comment)  Admission diagnosis:  Severe sepsis ([place]) [A41.9, R65.20] Sepsis due to undetermined organism ([place]) [A41.9] Sepsis, due to unspecified organism, unspecified whether acute organ dysfunction present [facility]) [A41.9] Patient Active Problem List   Diagnosis Date Noted  . Sepsis due to undetermined organism ([place]) 04/01/2020  . Lactic acidosis 04/01/2020  . AKI (acute kidney injury) ([place]) 04/01/2020  . Normocytic anemia 04/01/2020  . Thrombocytosis ([place]) 04/01/2020  . Hyperlipidemia   . Hyperglycemia   . Fall down stairs 11/25/2019  . Multiple rib fractures 11/25/2019  . Traumatic closed fracture of distal clavicle with minimal displacement, left, initial encounter 11/25/2019  . TBI (traumatic brain injury) ([place]) 11/22/2019  . Recurrent pruritus/urticaria 04/25/2018  . Seasonal and perennial allergic rhinitis 04/25/2018  . Wheezing/dyspnea 04/25/2018  . Coughing 04/25/2018  . Acute encephalopathy 09/30/2017  . BPH (benign prostatic hyperplasia) 09/30/2017  . 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 CVA (cerebral vascular accident) ([place]) 05/20/2016  .  Word finding difficulty   . Bacteremia 05/19/2016  . Fever 05/18/2016  . Headache 05/18/2016  . Leukocytosis 05/18/2016  . Hypokalemia 03/22/2013  . Sepsis ([place]) 03/21/2013  . Thrombocytopenia ([place]) 03/21/2013  . Acidosis 03/21/2013  . Tachycardia 03/21/2013  . UTI (lower urinary tract infection) 03/21/2013  . Hyponatremia 03/21/2013  . Seizure ([place])   . PTSD (post-traumatic  stress disorder)   . HTN (hypertension)   . GERD (gastroesophageal reflux disease)    PCP:  Patient, No Pcp Per Pharmacy:   [facility], [address] [place] [address] Phone: [phone] Fax: [phone]     Social Determinants of Health (SDOH) Interventions    Readmission Risk Interventions Readmission Risk Prevention Plan 04/01/2020  Transportation Screening Complete  Medication Review (RN Care Manager) Complete  Palliative Care Screening Not Applicable  Some recent data might be hidden

## 2020-04-01 NOTE — H&P (Signed)
History and Physical    Nathan [name] DXA:[id number] DOB: [birth year] DOA: 03/31/2020  PCP: Patient, No Pcp Per   Patient coming from: Home.  I have personally briefly reviewed patient's old medical records in [place]  Chief Complaint: LOC and hypotension.  HPI: Nathan [name] is a 74 y.o. male with medical history significant of DVT, GERD, hypertension, hyperlipidemia, PTSD, chronic renal insufficiency, history of seizures (last one was in 2004) who is coming to the emergency department due to progressively worse abdominal pain associated with constipation, decreased oral intake, fatigue, malaise and postural dizziness for the past 3 days.  He also had 2 episodes of nonbloody emesis in the past 24 hours.   He denies melena or hematochezia.  No dysuria, frequency or hematuria.  However, he states that he has been urinating less than usual.  He denies fever, chills, night sweats, rhinorrhea, sore throat, dyspnea, wheezing or hemoptysis.  No chest pain, palpitations, diaphoresis, PND, orthopnea or recent pitting edema of the lower extremities.  He denies polyuria, polydipsia, polyphagia or blurred vision.  ED Course: Initial vital signs were temperature 97.9 F, pulse 125, respiration 18, blood pressure 97/50 mmHg O2 sat 99% on room air.  The patient received 3000 mL of NS bolus, vancomycin and cefepime per pharmacy along with metronidazole 500 mg IVPB.  He has had several bowel movements while in the emergency [facility] and states he feels better.  CBC showed a white count of 42.9, with 91% neutrophils, hemoglobin 10.0 g/dL and platelets 783.  PT was 19.8, INR 1.9 PTT 36.  Lactic acid was 8.0, then 5.7 and then 4.1 mmol/L.  Venous blood gas showed pH of 7.36, PCO2 of 30.8 and PO2 of 67.26mmHg.  Bicarbonate was 18.7 acid base deficit 7.2 mmol/L.  This VBG was drawn at the same time when his lactic acid was 4.1 mmol/L.  Start coronavirus that was negative.  CMP showed a sodium 127, potassium 4.5,  chloride 92 and CO2 16 mg/dL.  Anion gap was 19.  Glucose 143, BUN 21 and creatinine 1.55 mg/dL.  LFTs show an albumin of 2.9 g/dL and AST is 54 units/L.  The rest of the hepatic functions were within expected range.  Imaging: Acute abdomen with chest showed moderate amount of stool within the rectum, possibly representing constipation and focal infection.  There is borderline gaseous distention of the colon without small bowel obstruction.  There is no acute process in the chest.  CT head without contrast did not show any acute intracranial abnormality.  CT abdomen/pelvis with contrast showed fluid distending the distal esophagus, stomach and small bowel loops with fecalization with small bowel contents.  There is mixed liquid and solid stool throughout the colon with formed stool distending the rectum.  Nondistended but thick-walled urinary bladder, which could be seen in cystitis.  Enlarged left inguinal node which is new from prior exam.  Absent renal excretion on delayed phase imaging suggests underlying renal dysfunction.  There was aortic atherosclerosis.  Please see images and full radiology report for further detail.  Review of Systems: As per HPI otherwise all other systems reviewed and are negative.   Past Medical History:  Diagnosis Date  . DVT (deep venous thrombosis) ([place])   . GERD (gastroesophageal reflux disease)   . HTN (hypertension)   . Hyperlipidemia   . Hypertension   . PTSD (post-traumatic stress disorder)   . Renal disorder   . Seizure ([place])    last one 2004  . Seizures (  [facility])     Past Surgical History:  Procedure Laterality Date  . JOINT REPLACEMENT     Left knee, R shoulder  . SHOULDER ARTHROSCOPY Left 2016    Social History  reports that he has been smoking pipe. He has smoked for the past 20.00 years. He has never used smokeless tobacco. He reports that he does not drink alcohol and does not use drugs.  Allergies  Allergen Reactions  . Terazosin Other (See  Comments)    Syncope  . Terazosin Other (See Comments)    unknown    Family History  Family history unknown: Yes   Prior to Admission medications   Medication Sig Start Date End Date Taking? Authorizing Provider  acetaminophen (TYLENOL) 325 MG tablet Take 2 tablets (650 mg total) by mouth every 6 (six) hours. 11/28/19   Norm Parcel, PA-C  albuterol (PROVENTIL HFA;VENTOLIN HFA) 108 (90 Base) MCG/ACT inhaler Inhale 2 puffs into the lungs every 6 (six) hours as needed for wheezing or shortness of breath. 04/25/18   Bobbitt, [place] Muta, MD  apixaban (ELIQUIS) 2.5 MG TABS tablet Take 2.5 mg by mouth 2 (two) times daily.    [provider]  apixaban (ELIQUIS) 5 MG TABS tablet Take 2 tablets (10 mg total) by mouth in the morning and at bedtime. 12/06/19   Norm Parcel, PA-C  ARIPiprazole (ABILIFY) 15 MG tablet Take 7.5 mg by mouth daily.    [provider]  ARIPiprazole (ABILIFY) 20 MG tablet Take 20 mg by mouth daily.     [provider]  azelastine (ASTELIN) 0.1 % nasal spray Place 2 sprays into both nostrils 2 (two) times daily. 04/25/18   Bobbitt, [place] Muta, MD  cetirizine (ZYRTEC) 10 MG tablet Take 10 mg by mouth daily.    [provider]  diazepam (VALIUM) 5 MG tablet Take 5 mg by mouth daily.     [provider]  docusate sodium (COLACE) 100 MG capsule Take 1 capsule (100 mg total) by mouth 2 (two) times daily. 11/29/19   Jill Alexanders, PA-C  enoxaparin (LOVENOX) 30 MG/0.3ML injection Inject 0.3 mLs (30 mg total) into the skin every 12 (twelve) hours for 8 days. 11/28/19 12/06/19  Norm Parcel, PA-C  famotidine (PEPCID) 20 MG tablet Take 1 tablet (20 mg total) by mouth 2 (two) times daily. 04/25/18   Bobbitt, [place] Muta, MD  feeding supplement, ENSURE ENLIVE, (ENSURE ENLIVE) LIQD Take 237 mLs by mouth 3 (three) times daily between meals. 11/28/19   Norm Parcel, PA-C  fexofenadine (ALLEGRA) 180 MG tablet Take 1 tablet (180 mg  total) by mouth daily. 04/25/18   Bobbitt, [place] Muta, MD  fluticasone (FLONASE) 50 MCG/ACT nasal spray Place 1 spray into both nostrils daily. 10/02/17   Barton Dubois, MD  fluticasone (FLONASE) 50 MCG/ACT nasal spray Place 2 sprays into both nostrils daily.    [provider]  guaiFENesin (ROBITUSSIN) 100 MG/5ML SOLN Take 10 mLs (200 mg total) by mouth every 4 (four) hours. 11/28/19   Norm Parcel, PA-C  levETIRAcetam (KEPPRA) 500 MG tablet Take 1 tablet (500 mg total) by mouth 2 (two) times daily for 8 days. 11/28/19 12/06/19  Norm Parcel, PA-C  lisinopril (PRINIVIL,ZESTRIL) 40 MG tablet Take 0.5 tablets (20 mg total) by mouth every morning. 10/02/17   Barton Dubois, MD  lisinopril (ZESTRIL) 10 MG tablet Take 10 mg by mouth daily.    [provider]  loratadine (CLARITIN) 10 MG tablet Take  1 tablet (10 mg total) by mouth daily. 11/29/19   Norm Parcel, PA-C  methocarbamol (ROBAXIN) 500 MG tablet Take 1 tablet (500 mg total) by mouth every 6 (six) hours. 11/28/19   Norm Parcel, PA-C  metoprolol succinate (TOPROL-XL) 25 MG 24 hr tablet Take 25 mg by mouth 2 (two) times daily.     [provider]  metoprolol tartrate (LOPRESSOR) 50 MG tablet Take 50 mg by mouth 2 (two) times daily.    [provider]  Multiple Vitamin (MULTIVITAMIN WITH MINERALS) TABS tablet Take 1 tablet by mouth at bedtime.     [provider]  Multiple Vitamin (MULTIVITAMIN WITH MINERALS) TABS tablet Take 1 tablet by mouth daily.    [provider]  Omega-3 Fatty Acids (OMEGA 3 500 PO) Take 1,000 mg by mouth daily.    [provider]  omeprazole (PRILOSEC) 10 MG capsule Take 10 mg by mouth daily.    [provider]  omeprazole (PRILOSEC) 20 MG capsule Take 20 mg by mouth daily.    [provider]  Oxcarbazepine (TRILEPTAL) 300 MG tablet Take 1 tablet (300 mg total) by mouth 2 (two) times daily. 10/02/17   Barton Dubois, MD  oxyCODONE 10 MG  TABS Take 0.5-1 tablets (5-10 mg total) by mouth every 4 (four) hours as needed for moderate pain or severe pain. 11/28/19   Norm Parcel, PA-C  polyethylene glycol (MIRALAX / GLYCOLAX) 17 g packet Take 17 g by mouth daily. 11/29/19   Jill Alexanders, PA-C  simvastatin (ZOCOR) 40 MG tablet Take 20 mg by mouth at bedtime.     [provider]  simvastatin (ZOCOR) 5 MG tablet Take 2.5 mg by mouth daily.    [provider]  sodium chloride (OCEAN) 0.65 % SOLN nasal spray Place 1 spray into both nostrils as needed for congestion. 11/28/19   Norm Parcel, PA-C  tamsulosin (FLOMAX) 0.4 MG CAPS capsule Take 2 capsules (0.8 mg total) by mouth at bedtime. 10/02/17   Barton Dubois, MD  venlafaxine (EFFEXOR) 100 MG tablet Take 300 mg by mouth daily.    [provider]  venlafaxine XR (EFFEXOR-XR) 150 MG 24 hr capsule Take 150 mg by mouth at bedtime.     [provider]  zolpidem (AMBIEN) 10 MG tablet Take 10 mg by mouth at bedtime as needed for sleep. Max 3-5 tablets per week.    [provider]    Physical Exam: Vitals:   03/31/20 2100 03/31/20 2130 03/31/20 2200 03/31/20 2230  BP: 121/71 (!) 141/83 (!) 148/113 (!) 161/89  Pulse: 93 (!) 109 (!) 131 (!) 109  Resp: (!) 23 (!) 23 (!) 29 (!) 30  Temp:      TempSrc:      SpO2: 97% 100% 96% 100%  Weight:      Height:        Constitutional: Looks acutely ill. Eyes: PERRL, lids and conjunctivae normal ENMT: Mucous membranes and lips are dry. Posterior pharynx clear of any exudate or lesions. Neck: normal, supple, no masses, no thyromegaly Respiratory: clear to auscultation bilaterally, no wheezing, no crackles. Normal respiratory effort. No accessory muscle use.  Cardiovascular: Regular rate and rhythm, no murmurs / rubs / gallops. No extremity edema. 2+ pedal pulses. No carotid bruits.  Abdomen: Nondistended.  BS positive.  Soft, no tenderness, no masses palpated. No hepatosplenomegaly. Musculoskeletal:  no clubbing / cyanosis.  Good ROM, no contractures. Normal muscle tone.  Skin: Some areas of  ecchymosis on extremities. Neurologic: CN 2-12 grossly intact. Sensation intact, DTR normal. Strength 5/5 in all 4.  Psychiatric: Normal judgment and insight. Alert and oriented x 3. Normal mood.   Labs on Admission: I have personally reviewed following labs and imaging studies  CBC: Recent Labs  Lab 03/31/20 1900  WBC 42.8*  NEUTROABS 39.1*  HGB 10.0*  HCT 31.9*  MCV 84.6  PLT 783*    Basic Metabolic Panel: Recent Labs  Lab 03/31/20 1900 03/31/20 2336  NA 127* 128*  K 4.5 4.5  CL 92* 98  CO2 16* 16*  GLUCOSE 143* 109*  BUN 21 24*  CREATININE 1.55* 1.62*  CALCIUM 9.1 8.0*    GFR: Estimated Creatinine Clearance: 47 mL/min (A) (by C-G formula based on SCr of 1.62 mg/dL (H)).  Liver Function Tests: Recent Labs  Lab 03/31/20 1900  AST 54*  ALT 15  ALKPHOS 87  BILITOT 1.0  PROT 7.6  ALBUMIN 2.8*   Radiological Exams on Admission: CT Head Wo Contrast  Result Date: 03/31/2020 CLINICAL DATA:  Seizure EXAM: CT HEAD WITHOUT CONTRAST TECHNIQUE: Contiguous axial images were obtained from the base of the skull through the vertex without intravenous contrast. COMPARISON:  11/23/2019 FINDINGS: Brain: There is atrophy and chronic small vessel disease changes. No acute intracranial abnormality. Specifically, no hemorrhage, hydrocephalus, mass lesion, acute infarction, or significant intracranial injury. Vascular: No hyperdense vessel or unexpected calcification. Skull: No acute calvarial abnormality. Sinuses/Orbits: Visualized paranasal sinuses and mastoids clear. Orbital soft tissues unremarkable. Other: None IMPRESSION: Atrophy, chronic microvascular disease. No acute intracranial abnormality. Electronically Signed   By: Rolm Baptise M.D.   On: 03/31/2020 22:03   CT ABDOMEN PELVIS W CONTRAST  Result Date: 03/31/2020 CLINICAL DATA:  Bowel obstruction suspected. Constipation, vomiting and  generalized abdominal pain. Syncopal episode. EXAM: CT ABDOMEN AND PELVIS WITH CONTRAST TECHNIQUE: Multidetector CT imaging of the abdomen and pelvis was performed using the standard protocol following bolus administration of intravenous contrast. CONTRAST:  64mL OMNIPAQUE IOHEXOL 300 MG/ML  SOLN COMPARISON:  Radiograph earlier this day. Abdominopelvic CT 11/22/2019 FINDINGS: Lower chest: No basilar consolidation or pleural effusion. There are coronary artery calcifications. Fluid distending the distal esophagus. Hepatobiliary: Unchanged low-density lesions in the right lobe of the liver. No new hepatic lesion. Gallbladder physiologically distended, no calcified stone. No biliary dilatation. Pancreas: Parenchymal atrophy. No ductal dilatation or inflammation. Spleen: Normal in size without focal abnormality. Adrenals/Urinary Tract: No adrenal nodule. No hydronephrosis. Tiny cortical hypodensity in the upper left mid lower right kidney too small to characterize but likely small cysts. No significant perinephric edema. No renal or ureteral calculi. There is absent renal excretion on delayed phase imaging. Nondistended but thick walled urinary bladder. Stomach/Bowel: Fluid distending the distal esophagus. Fluid distending the stomach. No gastric wall thickening. Small bowel loops are diffusely fluid-filled but no transition point. There is fecalization of small bowel contents involving the distal ileum. No definite wall thickening or perienteric edema. Normal appendix. Mild colonic distension with liquid and solid stool. There is formed stool in the sigmoid colon. Stool distends the rectum with rectal distention of 5.1 cm. No perirectal inflammation. No definite colonic wall thickening. Vascular/Lymphatic: Advanced aortic and branch atherosclerosis. Ectatic infrarenal aorta at 2.5 cm. Ectatic common iliac arteries with moderate atherosclerosis. The portal vein is patent. No acute vascular findings are seen. 12 mm left  inguinal node, series 2, image 80, new from prior exam. Scattered additional prominent bilateral inguinal and external iliac nodes. No retroperitoneal, upper abdominal, or more  mesenteric adenopathy. 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 Other: Trace free fluid in the dependent pelvis is likely reactive. There is no upper abdominal ascites. No free air or intra-abdominal abscess. Tiny fat containing umbilical hernia. Musculoskeletal: Advanced degenerative change throughout the thoracic spine. T12 superior endplate compression fracture is unchanged from prior. IMPRESSION: 1. Fluid distending the distal esophagus, stomach, and small bowel loops with fecalization of small bowel contents. Mixed liquid and solid stool throughout the colon, with formed stool distending the rectum. Query fecal impaction. Findings most consistent with generalized slow transit/constipation. No evidence of bowel obstruction or inflammation. 2. Nondistended but thick walled urinary bladder, can be seen with cystitis. Recommend correlation with urinalysis. 3. Enlarged left inguinal node is new from prior exam, nonspecific. 4. Absent renal excretion on delayed phase imaging suggests underlying renal dysfunction. Aortic Atherosclerosis (ICD10-I70.0). Electronically Signed   By: Keith Rake M.D.   On: 03/31/2020 22:07   DG Abdomen Acute W/Chest  Result Date: 03/31/2020 CLINICAL DATA:  Hypotension and tachycardia. Evaluate for bowel obstruction. EXAM: DG ABDOMEN ACUTE W/ 1V CHEST COMPARISON:  11/23/2019 chest radiograph FINDINGS: Frontal view of the chest demonstrates midline trachea. Normal heart size. Atherosclerosis in the transverse aorta. No pleural effusion or pneumothorax. Clear lungs. Abdominal films demonstrate gas and stool filled colon, including up to 6.8 cm. Stool within the rectum at up to 6.0 cm. No small bowel dilatation. No abnormal abdominal calcifications. No appendicolith. IMPRESSION: Moderate amount  of stool within the rectum, possibly representing constipation or fecal impaction. Borderline gaseous distension of the colon without small bowel obstruction. No acute process in the chest. Aortic Atherosclerosis (ICD10-I70.0). Electronically Signed   By: Abigail Miyamoto M.D.   On: 03/31/2020 19:30    EKG: Independently reviewed.  Vent. rate 99 BPM PR interval * ms QRS duration 86 ms QT/QTc 348/447 ms P-R-T axes 78 40 53 Sinus rhythm Abnormal R-wave progression, early transition  Assessment/Plan Principal Problem:   Sepsis due to undetermined organism POA ([place]) Admit to stepdown/inpatient. Continue IV fluids. Cefepime per pharmacy. Vancomycin per pharmacy. Continue metronidazole 500 mg IVPB every 8 hours. Check urinalysis with reflex UC&S. Follow WBC and CMP. Follow-up blood cultures and sensitivity.  Active Problems:   Lactic acidosis Secondary to sepsis and dehydration. Trending down with IV fluids. Follow lactic acid level.    AKI (acute kidney injury) ([place]) Continue IV fluids. Avoid nephrotoxic medications Monitor intake and output. Follow-up renal function electrolytes.    Hyponatremia Secondary to Trileptal and GI losses. Continue IV fluids. Follow-up sodium level.    Seizures ([place]) On oxycarbamazepine.    HTN (hypertension) Hold antihypertensives. Monitor blood pressure. Resume after recovered from sepsis.    GERD (gastroesophageal reflux disease) Continue PPI.    Normocytic anemia Monitor H&H. Transfuse as needed.    Thrombocytosis ([place]) Secondary to sepsis and/or recent blood loss? Follow platelet count in a.m.    Hyperlipidemia Hold statin. Resume after med rec performed.    Hyperglycemia Check fasting glucose in a.m.    DVT prophylaxis: On Pradaxa. Code Status:   Full code. Family Communication: Disposition Plan:   Patient is from:  Home.  Anticipated DC to:  Home.  Anticipated DC date:  April 03, 2020.  Anticipated DC  barriers: Clinical status.  Consults called: Admission status:  Inpatient/stepdown.   Severity of Illness: The patient came in hypotensive due to volume depletion, medications lactic acidosis.  He was found to have asked significant leukocytosis as well.  The patient will need at  least 2 to 3 days of IV antibiotic therapy and close follow-up to blood cultures.  Reubin Milan MD [facility]  How to contact the [facility] Attending or Consulting provider [place] or covering provider during after hours [place], for this patient?   1. Check the care team in [facility] and look for a) attending/consulting TR[facility] provider listed and b) the [facility] team listed 2. Log into [email or web address] and use [place]'s universal password to access. If you do not have the password, please contact the hospital operator. 3. Locate the [facility] provider you are looking for under [facility] and page to a number that you can be directly reached. 4. If you still have difficulty reaching the provider, please page the [facility] (Director on Call) for the Hospitalists listed on amion for assistance.  04/01/2020, 12:33 AM   This document was prepared using Dragon voice recognition software and may contain some unintended transcription errors.

## 2020-04-01 NOTE — Progress Notes (Signed)
Patient seen and examined.  Admitted after midnight secondary to loss of consciousness, hypertension and meeting criteria for severe sepsis on admission.  Currently with some improvement appreciated after fluid resuscitation and his sepsis features.  Continue to have elevated WBCs and given concerns of a leukemoid reaction.  Broad-spectrum antibiotics in place; culture results pending.  Please refer to H&P written by Dr. Olevia Bowens on 04/01/2020 for further info/details.  Plan: -Given negative MRSA PCR will discontinue vancomycin -Continue cefepime and Flagyl -Continue fluid resuscitation -Oncology service will be consulted for further recommendations in in case further work-up needed/wanted for leukemoid reaction. -Continue supportive care.  Barton Dubois MD [phone]

## 2020-04-02 DIAGNOSIS — D473 Essential (hemorrhagic) thrombocythemia: Secondary | ICD-10-CM

## 2020-04-02 DIAGNOSIS — D72825 Bandemia: Secondary | ICD-10-CM

## 2020-04-02 LAB — C DIFFICILE QUICK SCREEN W PCR REFLEX
C Diff antigen: NEGATIVE
C Diff interpretation: NOT DETECTED
C Diff toxin: NEGATIVE

## 2020-04-02 LAB — BLOOD CULTURE ID PANEL (REFLEXED) - BCID2

## 2020-04-02 LAB — COMPREHENSIVE METABOLIC PANEL
ALT: 15 U/L (ref 0–44)
AST: 21 U/L (ref 15–41)
Albumin: 2 g/dL — ABNORMAL LOW (ref 3.5–5.0)
Alkaline Phosphatase: 60 U/L (ref 38–126)
Anion gap: 11 (ref 5–15)
BUN: 28 mg/dL — ABNORMAL HIGH (ref 8–23)
CO2: 17 mmol/L — ABNORMAL LOW (ref 22–32)
Calcium: 7.9 mg/dL — ABNORMAL LOW (ref 8.9–10.3)
Chloride: 104 mmol/L (ref 98–111)
Creatinine, Ser: 1.35 mg/dL — ABNORMAL HIGH (ref 0.61–1.24)
GFR calc Af Amer: 59 mL/min — ABNORMAL LOW (ref >60)
GFR calc non Af Amer: 51 mL/min — ABNORMAL LOW (ref >60)
Glucose, Bld: 104 mg/dL — ABNORMAL HIGH (ref 70–99)
Potassium: 4.1 mmol/L (ref 3.5–5.1)
Sodium: 132 mmol/L — ABNORMAL LOW (ref 135–145)
Total Bilirubin: 0.8 mg/dL (ref 0.3–1.2)
Total Protein: 5.8 g/dL — ABNORMAL LOW (ref 6.5–8.1)

## 2020-04-02 LAB — CBC WITH DIFFERENTIAL/PLATELET
Abs Immature Granulocytes: 0.38 10*3/uL — ABNORMAL HIGH (ref 0.00–0.07)
Basophils Absolute: 0 10*3/uL (ref 0.0–0.1)
Basophils Relative: 0 %
Eosinophils Absolute: 0 10*3/uL (ref 0.0–0.5)
Eosinophils Relative: 0 %
HCT: 23.5 % — ABNORMAL LOW (ref 39.0–52.0)
Hemoglobin: 7.5 g/dL — ABNORMAL LOW (ref 13.0–17.0)
Immature Granulocytes: 2 %
Lymphocytes Relative: 5 %
Lymphs Abs: 1.2 10*3/uL (ref 0.7–4.0)
MCH: 26.5 pg (ref 26.0–34.0)
MCHC: 31.9 g/dL (ref 30.0–36.0)
MCV: 83 fL (ref 80.0–100.0)
Monocytes Absolute: 0.7 10*3/uL (ref 0.1–1.0)
Monocytes Relative: 3 %
Neutro Abs: 23.1 10*3/uL — ABNORMAL HIGH (ref 1.7–7.7)
Neutrophils Relative %: 90 %
Platelets: 506 10*3/uL — ABNORMAL HIGH (ref 150–400)
RBC: 2.83 MIL/uL — ABNORMAL LOW (ref 4.22–5.81)
RDW: 17.6 % — ABNORMAL HIGH (ref 11.5–15.5)
WBC: 25.4 10*3/uL — ABNORMAL HIGH (ref 4.0–10.5)
nRBC: 0 % (ref 0.0–0.2)

## 2020-04-02 LAB — URINE CULTURE: Culture: <10000 — AB

## 2020-04-02 MED ORDER — ARIPIPRAZOLE 5 MG PO TABS: 7.5000 mg | ORAL_TABLET | Freq: Every day | ORAL | Status: DC

## 2020-04-02 MED ORDER — SIMVASTATIN 20 MG PO TABS
20.0000 mg | ORAL_TABLET | Freq: Every day | ORAL | Status: DC
  Administered 2020-04-02 – 2020-04-10 (×9): 20 mg via ORAL
  Filled 2020-04-02 (×5): qty 1
  Filled 2020-04-02: qty 2
  Filled 2020-04-02 (×3): qty 1

## 2020-04-02 MED ORDER — AZELASTINE HCL 0.1 % NA SOLN: 2.0000 | Freq: Two times a day (BID) | NASAL | Status: DC

## 2020-04-02 MED ORDER — VENLAFAXINE HCL ER 75 MG PO CP24
150.0000 mg | ORAL_CAPSULE | Freq: Every day | ORAL | Status: DC
  Administered 2020-04-02 – 2020-04-10 (×9): 150 mg via ORAL
  Filled 2020-04-02 (×9): qty 2

## 2020-04-02 MED ORDER — DIAZEPAM 5 MG PO TABS: 5.0000 mg | ORAL_TABLET | Freq: Every day | ORAL | Status: DC

## 2020-04-02 MED ORDER — OXCARBAZEPINE 150 MG PO TABS
150.0000 mg | ORAL_TABLET | Freq: Two times a day (BID) | ORAL | Status: DC
  Administered 2020-04-02 – 2020-04-10 (×16): 150 mg via ORAL
  Filled 2020-04-02 (×25): qty 1

## 2020-04-02 MED ORDER — METOPROLOL SUCCINATE ER 25 MG PO TB24: 25.0000 mg | ORAL_TABLET | Freq: Two times a day (BID) | ORAL | Status: DC

## 2020-04-02 MED ORDER — LORATADINE 10 MG PO TABS
10.0000 mg | ORAL_TABLET | Freq: Every day | ORAL | Status: DC
  Administered 2020-04-02 – 2020-04-10 (×8): 10 mg via ORAL
  Filled 2020-04-02 (×9): qty 1

## 2020-04-02 MED ORDER — METOPROLOL TARTRATE 50 MG PO TABS
50.0000 mg | ORAL_TABLET | Freq: Two times a day (BID) | ORAL | Status: DC
  Administered 2020-04-02 – 2020-04-10 (×17): 50 mg via ORAL
  Filled 2020-04-02 (×18): qty 1

## 2020-04-02 MED ORDER — APIXABAN 2.5 MG PO TABS
2.5000 mg | ORAL_TABLET | Freq: Two times a day (BID) | ORAL | Status: DC
  Administered 2020-04-02: 2.5 mg via ORAL
  Filled 2020-04-02 (×2): qty 1

## 2020-04-02 MED ORDER — TAMSULOSIN HCL 0.4 MG PO CAPS: 0.8000 mg | ORAL_CAPSULE | Freq: Every day | ORAL | Status: DC

## 2020-04-02 MED ORDER — OXCARBAZEPINE 300 MG PO TABS: 300.0000 mg | ORAL_TABLET | Freq: Two times a day (BID) | ORAL | Status: DC

## 2020-04-02 MED ORDER — ARIPIPRAZOLE 2 MG PO TABS
2.0000 mg | ORAL_TABLET | Freq: Every day | ORAL | Status: DC
  Administered 2020-04-03 – 2020-04-10 (×8): 2 mg via ORAL
  Filled 2020-04-02 (×11): qty 1

## 2020-04-02 MED ORDER — ENSURE ENLIVE PO LIQD
237.0000 mL | Freq: Three times a day (TID) | ORAL | Status: DC
  Administered 2020-04-02 – 2020-04-10 (×11): 237 mL via ORAL

## 2020-04-02 MED ORDER — LOPERAMIDE HCL 2 MG PO CAPS
2.0000 mg | ORAL_CAPSULE | ORAL | Status: DC | PRN
  Administered 2020-04-02 – 2020-04-05 (×4): 2 mg via ORAL
  Filled 2020-04-02 (×4): qty 1

## 2020-04-02 MED ORDER — CARBIDOPA-LEVODOPA 25-100 MG PO TABS
1.0000 | ORAL_TABLET | Freq: Two times a day (BID) | ORAL | Status: DC
  Administered 2020-04-02 – 2020-04-10 (×17): 1 via ORAL
  Filled 2020-04-02 (×16): qty 1

## 2020-04-02 MED ORDER — SODIUM CHLORIDE 0.9 % IV SOLN: INTRAVENOUS | Status: AC

## 2020-04-02 NOTE — Progress Notes (Signed)
Report called and given to Lilia Pro, RN on 300. Pt to be transported via bed to room 327.

## 2020-04-02 NOTE — Progress Notes (Addendum)
PROGRESS NOTE    BOB DAVERSA  NIO:[id number] DOB: 06/11/1946 DOA: 03/31/2020 PCP: Patient, No Pcp Per   Brief Narrative:  Per HPI: MIHCAEL LEDEE is a 74 y.o. male with medical history significant of DVT, GERD, hypertension, hyperlipidemia, PTSD, chronic renal insufficiency, history of seizures (last one was in 2004) who is coming to the emergency department due to progressively worse abdominal pain associated with constipation, decreased oral intake, fatigue, malaise and postural dizziness for the past 3 days.  He also had 2 episodes of nonbloody emesis in the past 24 hours.   He denies melena or hematochezia.  No dysuria, frequency or hematuria.  However, he states that he has been urinating less than usual.  He denies fever, chills, night sweats, rhinorrhea, sore throat, dyspnea, wheezing or hemoptysis.  No chest pain, palpitations, diaphoresis, PND, orthopnea or recent pitting edema of the lower extremities.  He denies polyuria, polydipsia, polyphagia or blurred vision.  9/8: Patient seen and evaluated this morning.  Multiple loose, liquidy stools noted overnight.  Plan to check C. difficile and GI panel.  Leukocytosis improving on current antibiotics as well as other lab work.  Hematology consultation requested.  Assessment & Plan:   Principal Problem:   Sepsis due to undetermined organism [facility]) Active Problems:   Seizure ([facility])   HTN (hypertension)   GERD (gastroesophageal reflux disease)   Hyponatremia   Lactic acidosis   AKI (acute kidney injury) ([place])   Normocytic anemia   Thrombocytosis ([facility])   Hyperlipidemia   Hyperglycemia  Sepsis on admission of unknown etiology -Patient now with diarrhea, will explore possibility of C. difficile colitis and check GI panel -Blood cultures thus far negative -Urine cultures with insignificant growth -Continue current antibiotics -Okay for transfer to telemetry today  Lactic acidosis secondary to above -Recheck levels in  a.m.  AKI -Likely secondary to sepsis physiology -Creatinine at baseline near 0.9 and currently 1.35 -Continue to monitor -Maintain on further IV fluid given ongoing diarrhea  Hyponatremia-improving -Likely related to GI losses -Continue normal saline as ordered -Recheck labs in a.m.  Seizure history -Continue oxycarbamazepine -Continue Sinemet  Hypertension -Hold lisinopril and start metoprolol -Labetalol prn  GERD -PPI -We will hold if positive for C. Difficile  Possible leukemoid reaction -Appreciate hematology evaluation  Dyslipidemia -Resume home statin  History of prior DVT -Continue home Eliquis   DVT prophylaxis:Eliquis Code Status: Full Family Communication: Discussed with wife 9/8 Disposition Plan:  Status is: Inpatient  Remains inpatient appropriate because:IV treatments appropriate due to intensity of illness or inability to take PO and Inpatient level of care appropriate due to severity of illness   Dispo: The patient is from: Home              Anticipated d/c is to: Home              Anticipated d/c date is: 2 days              Patient currently is not medically stable to d/c.  Consultants:   Heme/Onc  Procedures:   See below  Antimicrobials:  Anti-infectives (From admission, onward)   Start     Dose/Rate Route Frequency Ordered Stop   04/01/20 0830  vancomycin (VANCOREADY) IVPB 750 mg/150 mL  Status:  Discontinued        750 mg 150 mL/hr over 60 Minutes Intravenous Every 12 hours 03/31/20 2024 04/01/20 1004   04/01/20 0400  metroNIDAZOLE (FLAGYL) IVPB 500 mg  500 mg 100 mL/hr over 60 Minutes Intravenous Every 8 hours 04/01/20 0026     03/31/20 2030  ceFEPIme (MAXIPIME) 2 g in sodium chloride 0.9 % 100 mL IVPB        2 g 200 mL/hr over 30 Minutes Intravenous Every 12 hours 03/31/20 2024     03/31/20 2015  vancomycin (VANCOREADY) IVPB 2000 mg/400 mL        2,000 mg 200 mL/hr over 120 Minutes Intravenous  Once 03/31/20 2002  04/01/20 0027   03/31/20 2000  ceFEPIme (MAXIPIME) 2 g in sodium chloride 0.9 % 100 mL IVPB  Status:  Discontinued        2 g 200 mL/hr over 30 Minutes Intravenous  Once 03/31/20 1954 03/31/20 2024   03/31/20 2000  metroNIDAZOLE (FLAGYL) IVPB 500 mg        500 mg 100 mL/hr over 60 Minutes Intravenous  Once 03/31/20 1954 03/31/20 2159   03/31/20 2000  vancomycin (VANCOCIN) IVPB 1000 mg/200 mL premix  Status:  Discontinued        1,000 mg 200 mL/hr over 60 Minutes Intravenous  Once 03/31/20 1954 03/31/20 2002      Subjective: Patient seen and evaluated today with no new acute complaints or concerns. No acute concerns or events noted overnight.  He was noted to have about 5 liquid bowel movements overnight.  He denies any abdominal pain, nausea, or vomiting.  Tolerating diet well.  Objective: Vitals:   04/02/20 0900 04/02/20 1000 04/02/20 1100 04/02/20 1136  BP: (!) 151/71 (!) 191/85 (!) 152/71   Pulse: 85 89 93   Resp: (!) 21 (!) 24 (!) 23   Temp:    98 F (36.7 C)  TempSrc:      SpO2: 99% 96% 98%   Weight:      Height:        Intake/Output Summary (Last 24 hours) at 04/02/2020 1213 Last data filed at 04/02/2020 0900 Gross per 24 hour  Intake 2890.72 ml  Output 1000 ml  Net 1890.72 ml   Filed Weights   03/31/20 1812 04/01/20 0206 04/02/20 0500  Weight: 98 kg 86 kg 86 kg    Examination:  General exam: Appears calm and comfortable  Respiratory system: Clear to auscultation. Respiratory effort normal.  Currently on room air. Cardiovascular system: S1 & S2 heard, RRR.  Gastrointestinal system: Abdomen is nondistended, soft and nontender. Central nervous system: Alert and oriented. No focal neurological deficits. Extremities: Symmetric 5 x 5 power. Skin: No rashes, lesions or ulcers Psychiatry: Judgement and insight appear normal. Mood & affect appropriate.     Data Reviewed: I have personally reviewed following labs and imaging studies  CBC: Recent Labs  Lab  03/31/20 1900 04/01/20 0323 04/02/20 0531  WBC 42.8* 42.4* 25.4*  NEUTROABS 39.1* 38.2* 23.1*  HGB 10.0* 8.8* 7.5*  HCT 31.9* 27.6* 23.5*  MCV 84.6 84.9 83.0  PLT 783* 577* 382*   Basic Metabolic Panel: Recent Labs  Lab 03/31/20 1900 03/31/20 2336 04/01/20 0323 04/02/20 0531  NA 127* 128* 129* 132*  K 4.5 4.5 5.0 4.1  CL 92* 98 99 104  CO2 16* 16* 17* 17*  GLUCOSE 143* 109* 122* 104*  BUN 21 24* 27* 28*  CREATININE 1.55* 1.62* 1.71* 1.35*  CALCIUM 9.1 8.0* 7.8* 7.9*   GFR: Estimated Creatinine Clearance: 50.4 mL/min (A) (by C-G formula based on SCr of 1.35 mg/dL (H)). Liver Function Tests: Recent Labs  Lab 03/31/20 1900 04/01/20 0323 04/02/20 0531  AST  54* 39 21  ALT 15 11 15   ALKPHOS 87 70 60  BILITOT 1.0 1.3* 0.8  PROT 7.6 6.2* 5.8*  ALBUMIN 2.8* 2.2* 2.0*   Recent Labs  Lab 03/31/20 1900  LIPASE 41   No results for input(s): AMMONIA in the last 168 hours. Coagulation Profile: Recent Labs  Lab 03/31/20 1900  INR 1.8*   Cardiac Enzymes: No results for input(s): CKTOTAL, CKMB, CKMBINDEX, TROPONINI in the last 168 hours. BNP (last 3 results) No results for input(s): PROBNP in the last 8760 hours. HbA1C: No results for input(s): HGBA1C in the last 72 hours. CBG: No results for input(s): GLUCAP in the last 168 hours. Lipid Profile: No results for input(s): CHOL, HDL, LDLCALC, TRIG, CHOLHDL, LDLDIRECT in the last 72 hours. Thyroid Function Tests: No results for input(s): TSH, T4TOTAL, FREET4, T3FREE, THYROIDAB in the last 72 hours. Anemia Panel: No results for input(s): VITAMINB12, FOLATE, FERRITIN, TIBC, IRON, RETICCTPCT in the last 72 hours. Sepsis Labs: Recent Labs  Lab 03/31/20 1900 03/31/20 2055 03/31/20 2336 04/01/20 0323  LATICACIDVEN 8.0* 5.7* 4.1* 2.3*    Recent Results (from the past 240 hour(s))  Blood culture (routine single)     Status: None (Preliminary result)   Collection Time: 03/31/20  7:00 PM   Specimen: Left Antecubital;  Blood  Result Value Ref Range Status   Specimen Description   Final    LEFT ANTECUBITAL Performed at [facility], [address], [place], [place] [address]    Special Requests   Final    BOTTLES DRAWN AEROBIC AND ANAEROBIC Blood Culture adequate volume Performed at [facility], [address] [place], [place] [address]    Culture  Setup Time   Final    GRAM NEGATIVE RODS ANAEROBIC BOTTLE Gram Stain Report Called to,Read Back By and Verified With: MURPHY,[id number]  BY MATTHEWS, B 9.7.21 [facility] GRAM STAIN REVIEWED-AGREE WITH RESULT    Culture   Final    CULTURE REINCUBATED FOR BETTER GROWTH Performed at [facility], [place] [address], [place], [place] [address]    Report Status PENDING  Incomplete  SARS Coronavirus 2 by RT PCR (hospital order, performed in [facility] hospital lab) Nasopharyngeal Nasopharyngeal Swab     Status: None   Collection Time: 03/31/20  7:00 PM   Specimen: Nasopharyngeal Swab  Result Value Ref Range Status   SARS Coronavirus 2 NEGATIVE NEGATIVE Final    Comment: (NOTE) SARS-CoV-2 target nucleic acids are NOT DETECTED.  The SARS-CoV-2 RNA is generally detectable in upper and lower respiratory specimens during the acute phase of infection. The lowest concentration of SARS-CoV-2 viral copies this assay can detect is 250 copies / mL. A negative result does not preclude SARS-CoV-2 infection and should not be used as the sole basis for treatment or other patient management decisions.  A negative result may occur with improper specimen collection / handling, submission of specimen other than nasopharyngeal swab, presence of viral mutation(s) within the areas targeted by this assay, and inadequate number of viral copies (<250 copies / mL). A negative result must be combined with clinical observations, patient history, and epidemiological information.  Fact Sheet for Patients:   StrictlyIdeas.no  Fact Sheet for Healthcare  Providers: BankingDealers.co.za  This test is not yet approved or  cleared by the [place] FDA and has been authorized for detection and/or diagnosis of SARS-CoV-2 by FDA under an Emergency Use Authorization (EUA).  This EUA will remain in effect (meaning this test can be used) for  the duration of the COVID-19 declaration under Section 564(b)(1) of the Act, 21 U.S.C. section 360bbb-3(b)(1), unless the authorization is terminated or revoked sooner.  Performed at [facility], [address] [place], [place] [address]   Blood Culture ID Panel (Reflexed)     Status: None   Collection Time: 03/31/20  7:00 PM  Result Value Ref Range Status   Enterococcus faecalis NOT DETECTED NOT DETECTED Final   Enterococcus Faecium NOT DETECTED NOT DETECTED Final   Listeria monocytogenes NOT DETECTED NOT DETECTED Final   Staphylococcus species NOT DETECTED NOT DETECTED Final   Staphylococcus aureus (BCID) NOT DETECTED NOT DETECTED Final   Staphylococcus epidermidis NOT DETECTED NOT DETECTED Final   Staphylococcus lugdunensis NOT DETECTED NOT DETECTED Final   Streptococcus species NOT DETECTED NOT DETECTED Final   Streptococcus agalactiae NOT DETECTED NOT DETECTED Final   Streptococcus pneumoniae NOT DETECTED NOT DETECTED Final   Streptococcus pyogenes NOT DETECTED NOT DETECTED Final   A.calcoaceticus-baumannii NOT DETECTED NOT DETECTED Final   Bacteroides fragilis NOT DETECTED NOT DETECTED Final   Enterobacterales NOT DETECTED NOT DETECTED Final   Enterobacter cloacae complex NOT DETECTED NOT DETECTED Final   Escherichia coli NOT DETECTED NOT DETECTED Final   Klebsiella aerogenes NOT DETECTED NOT DETECTED Final   Klebsiella oxytoca NOT DETECTED NOT DETECTED Final   Klebsiella pneumoniae NOT DETECTED NOT DETECTED Final   Proteus species NOT DETECTED NOT DETECTED Final   Salmonella species NOT DETECTED NOT DETECTED Final   Serratia marcescens NOT DETECTED NOT DETECTED Final    Haemophilus influenzae NOT DETECTED NOT DETECTED Final   Neisseria meningitidis NOT DETECTED NOT DETECTED Final   Pseudomonas aeruginosa NOT DETECTED NOT DETECTED Final   Stenotrophomonas maltophilia NOT DETECTED NOT DETECTED Final   Candida albicans NOT DETECTED NOT DETECTED Final   Candida auris NOT DETECTED NOT DETECTED Final   Candida glabrata NOT DETECTED NOT DETECTED Final   Candida krusei NOT DETECTED NOT DETECTED Final   Candida parapsilosis NOT DETECTED NOT DETECTED Final   Candida tropicalis NOT DETECTED NOT DETECTED Final   Cryptococcus neoformans/gattii NOT DETECTED NOT DETECTED Final    Comment: Performed at [facility], [address] [place], [place] [address]  Culture, blood (single)     Status: None (Preliminary result)   Collection Time: 03/31/20  8:55 PM   Specimen: BLOOD  Result Value Ref Range Status   Specimen Description BLOOD RIGHT ANTECUBITAL  Final   Special Requests   Final    BOTTLES DRAWN AEROBIC AND ANAEROBIC Blood Culture adequate volume   Culture   Final    NO GROWTH 2 DAYS Performed at [facility], [address] [place], [place] [address]    Report Status PENDING  Incomplete  Urine culture     Status: Abnormal   Collection Time: 04/01/20 12:44 AM   Specimen: In/Out Cath Urine  Result Value Ref Range Status   Specimen Description   Final    IN/OUT CATH URINE Performed at [facility], [address], [place], [place] [address]    Special Requests   Final    NONE Performed at [facility], [address], [place], [place] [address]    Culture (A)  Final    <10,000 COLONIES/mL INSIGNIFICANT GROWTH Performed at [place] [address], [place], [place] [address]    Report Status 04/02/2020 FINAL  Final  MRSA PCR Screening     Status: None   Collection Time: 04/01/20  1:54 AM   Specimen:  Nasopharyngeal  Result Value Ref Range Status   MRSA by PCR NEGATIVE NEGATIVE Final    Comment:        The GeneXpert MRSA Assay  (FDA approved for NASAL specimens only), is one component of a comprehensive MRSA colonization surveillance program. It is not intended to diagnose MRSA infection nor to guide or monitor treatment for MRSA infections. Performed at [facility], [address] [place], [place] [address]          Radiology Studies: CT Head Wo Contrast  Result Date: 03/31/2020 CLINICAL DATA:  Seizure EXAM: CT HEAD WITHOUT CONTRAST TECHNIQUE: Contiguous axial images were obtained from the base of the skull through the vertex without intravenous contrast. COMPARISON:  11/23/2019 FINDINGS: Brain: There is atrophy and chronic small vessel disease changes. No acute intracranial abnormality. Specifically, no hemorrhage, hydrocephalus, mass lesion, acute infarction, or significant intracranial injury. Vascular: No hyperdense vessel or unexpected calcification. Skull: No acute calvarial abnormality. Sinuses/Orbits: Visualized paranasal sinuses and mastoids clear. Orbital soft tissues unremarkable. Other: None IMPRESSION: Atrophy, chronic microvascular disease. No acute intracranial abnormality. Electronically Signed   By: Rolm Baptise M.D.   On: 03/31/2020 22:03   CT ABDOMEN PELVIS W CONTRAST  Result Date: 03/31/2020 CLINICAL DATA:  Bowel obstruction suspected. Constipation, vomiting and generalized abdominal pain. Syncopal episode. EXAM: CT ABDOMEN AND PELVIS WITH CONTRAST TECHNIQUE: Multidetector CT imaging of the abdomen and pelvis was performed using the standard protocol following bolus administration of intravenous contrast. CONTRAST:  25mL OMNIPAQUE IOHEXOL 300 MG/ML  SOLN COMPARISON:  Radiograph earlier this day. Abdominopelvic CT 11/22/2019 FINDINGS: Lower chest: No basilar consolidation or pleural effusion. There are coronary artery calcifications. Fluid distending the distal esophagus. Hepatobiliary: Unchanged low-density lesions in the right lobe of the liver. No new hepatic lesion. Gallbladder physiologically  distended, no calcified stone. No biliary dilatation. Pancreas: Parenchymal atrophy. No ductal dilatation or inflammation. Spleen: Normal in size without focal abnormality. Adrenals/Urinary Tract: No adrenal nodule. No hydronephrosis. Tiny cortical hypodensity in the upper left mid lower right kidney too small to characterize but likely small cysts. No significant perinephric edema. No renal or ureteral calculi. There is absent renal excretion on delayed phase imaging. Nondistended but thick walled urinary bladder. Stomach/Bowel: Fluid distending the distal esophagus. Fluid distending the stomach. No gastric wall thickening. Small bowel loops are diffusely fluid-filled but no transition point. There is fecalization of small bowel contents involving the distal ileum. No definite wall thickening or perienteric edema. Normal appendix. Mild colonic distension with liquid and solid stool. There is formed stool in the sigmoid colon. Stool distends the rectum with rectal distention of 5.1 cm. No perirectal inflammation. No definite colonic wall thickening. Vascular/Lymphatic: Advanced aortic and branch atherosclerosis. Ectatic infrarenal aorta at 2.5 cm. Ectatic common iliac arteries with moderate atherosclerosis. The portal vein is patent. No acute vascular findings are seen. 12 mm left inguinal node, series 2, image 80, new from prior exam. Scattered additional prominent bilateral inguinal and external iliac nodes. No retroperitoneal, upper abdominal, or more mesenteric adenopathy. 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 Other: Trace free fluid in the dependent pelvis is likely reactive. There is no upper abdominal ascites. No free air or intra-abdominal abscess. Tiny fat containing umbilical hernia. Musculoskeletal: Advanced degenerative change throughout the thoracic spine. T12 superior endplate compression fracture is unchanged from prior. IMPRESSION: 1. Fluid distending the distal esophagus,  stomach, and small bowel loops with fecalization of small bowel contents. Mixed liquid and solid stool throughout the colon, with formed stool  distending the rectum. Query fecal impaction. Findings most consistent with generalized slow transit/constipation. No evidence of bowel obstruction or inflammation. 2. Nondistended but thick walled urinary bladder, can be seen with cystitis. Recommend correlation with urinalysis. 3. Enlarged left inguinal node is new from prior exam, nonspecific. 4. Absent renal excretion on delayed phase imaging suggests underlying renal dysfunction. Aortic Atherosclerosis (ICD10-I70.0). Electronically Signed   By: Keith Rake M.D.   On: 03/31/2020 22:07   DG Abdomen Acute W/Chest  Result Date: 03/31/2020 CLINICAL DATA:  Hypotension and tachycardia. Evaluate for bowel obstruction. EXAM: DG ABDOMEN ACUTE W/ 1V CHEST COMPARISON:  11/23/2019 chest radiograph FINDINGS: Frontal view of the chest demonstrates midline trachea. Normal heart size. Atherosclerosis in the transverse aorta. No pleural effusion or pneumothorax. Clear lungs. Abdominal films demonstrate gas and stool filled colon, including up to 6.8 cm. Stool within the rectum at up to 6.0 cm. No small bowel dilatation. No abnormal abdominal calcifications. No appendicolith. IMPRESSION: Moderate amount of stool within the rectum, possibly representing constipation or fecal impaction. Borderline gaseous distension of the colon without small bowel obstruction. No acute process in the chest. Aortic Atherosclerosis (ICD10-I70.0). Electronically Signed   By: Abigail Miyamoto M.D.   On: 03/31/2020 19:30        Scheduled Meds: . Chlorhexidine Gluconate Cloth  6 each Topical Daily   Continuous Infusions: . ceFEPime (MAXIPIME) IV 2 g (04/02/20 9201)  . metronidazole 500 mg (04/02/20 1132)     LOS: 1 day    Time spent: 35 minutes    Ollivander See D Manuella Ghazi, DO [facility]  If 7PM-7AM, please contact  night-coverage [email or web address] 04/02/2020, 12:13 PM

## 2020-04-02 NOTE — Consult Note (Signed)
[facility] Consultation Oncology  Name: Nathan [name]      MRN: [id number]    Location: IC04/IC04-01  Date: 04/02/2020 Time:5:52 PM   REFERRING PHYSICIAN: Dr. Manuella Ghazi  REASON FOR CONSULT: Leukocytosis   DIAGNOSIS: Most likely leukemoid reaction  HISTORY OF PRESENT ILLNESS: Nathan [name] is a 74 year old very pleasant white male seen in consultation today at the request of Dr. Manuella Ghazi for further work-up and management of leukocytosis.  He came to the ER on 03/31/2020 with constipation and abdominal pain and was found to have a white count of 42.8 with hemoglobin 10 and platelet count of 783.  Also found to have elevated lactic acid.  Differential showed 91% neutrophils, 3% lymphocytes, 3% monocytes.  His white count the next day was 42.4 and it improved today to 25.4.  He has been afebrile.  He was started on IV antibiotics after admission.  CT scan of the abdomen and pelvis with contrast on 03/31/2020 showed normal-sized spleen.  Fluid distending the distal esophagus, stomach with no wall thickening.  Small bowel loops diffusely fluid-filled but no transition point.  There is fecalization of small bowel contents involving the distal ileum.  No definite wall thickening.  There is a 12 mm left inguinal lymph node.  No retroperitoneal or mesenteric adenopathy.  He denies any fevers, night sweats or weight loss in the preceding 3 to 6 months.  No recent antibiotic use.  He reports that he has developed diarrhea since yesterday.  His stool was checked for C. difficile which was negative.  GI panel is still pending.  Denies any family history of malignancies.  Smokes pipe every day.  Worked as an Programme researcher, broadcasting/film/video.  Lives at home  with his wife.  PAST MEDICAL HISTORY:   Past Medical History:  Diagnosis Date  . DVT (deep venous thrombosis) ([place])   . GERD (gastroesophageal reflux disease)   . HTN (hypertension)   . Hyperlipidemia   . Hypertension   . PTSD (post-traumatic stress disorder)   . Renal disorder   .  Seizure ([place])    last one 2004  . Seizures ([facility])     ALLERGIES: Allergies  Allergen Reactions  . Terazosin Other (See Comments)    Syncope  . Terazosin Other (See Comments)    unknown      MEDICATIONS: I have reviewed the patient's current medications.     PAST SURGICAL HISTORY Past Surgical History:  Procedure Laterality Date  . JOINT REPLACEMENT     Left knee, R shoulder  . SHOULDER ARTHROSCOPY Left 2016    FAMILY HISTORY: Family History  Family history unknown: Yes    SOCIAL HISTORY:  reports that he has been smoking pipe. He has smoked for the past 20.00 years. He has never used smokeless tobacco. He reports that he does not drink alcohol and does not use drugs.  PERFORMANCE STATUS: The patient's performance status is 2 - Symptomatic, <50% confined to bed  PHYSICAL EXAM: Most Recent Vital Signs: Blood pressure (!) 177/74, pulse (!) 105, temperature 98.5 F (36.9 C), temperature source Oral, resp. rate (!) 22, height 5\' 11"  (1.803 m), weight 189 lb 9.5 oz (86 kg), SpO2 98 %. BP (!) 177/74   Pulse (!) 105   Temp 98.5 F (36.9 C) (Oral)   Resp (!) 22   Ht 5\' 11"  (1.803 m)   Wt 189 lb 9.5 oz (86 kg)   SpO2 98%   BMI 26.44 kg/m  General appearance: alert, cooperative and appears stated age  Neck: no adenopathy Lungs: clear to auscultation bilaterally Heart: irregularly irregular rhythm Abdomen: Soft, generalized mild tenderness.  No distention.  No organomegaly. Extremities: Multiple healed skin lesions on both legs.  No edema. Skin: Multiple hypopigmented skin lesions indicating healed wounds on the lower extremities. Lymph nodes: Cervical, supraclavicular, and axillary nodes normal. Neurologic: Grossly normal  LABORATORY DATA:  Results for orders placed or performed during the hospital encounter of 03/31/20 (from the past 48 hour(s))  Lactic acid, plasma     Status: Abnormal   Collection Time: 03/31/20  7:00 PM  Result Value Ref Range   Lactic Acid,  Venous 8.0 (HH) 0.5 - 1.9 mmol/L    Comment: CRITICAL RESULT CALLED TO, READ BACK BY AND VERIFIED WITH: WALKER,T ON 03/31/20 AT 2030 BY LOY,C Performed at [facility], [address], [place], [place] [address]   Comprehensive metabolic panel     Status: Abnormal   Collection Time: 03/31/20  7:00 PM  Result Value Ref Range   Sodium 127 (L) 135 - 145 mmol/L   Potassium 4.5 3.5 - 5.1 mmol/L   Chloride 92 (L) 98 - 111 mmol/L   CO2 16 (L) 22 - 32 mmol/L   Glucose, Bld 143 (H) 70 - 99 mg/dL    Comment: Glucose reference range applies only to samples taken after fasting for at least 8 hours.   BUN 21 8 - 23 mg/dL   Creatinine, Ser 1.55 (H) 0.61 - 1.24 mg/dL   Calcium 9.1 8.9 - 10.3 mg/dL   Total Protein 7.6 6.5 - 8.1 g/dL   Albumin 2.8 (L) 3.5 - 5.0 g/dL   AST 54 (H) 15 - 41 U/L   ALT 15 0 - 44 U/L   Alkaline Phosphatase 87 38 - 126 U/L   Total Bilirubin 1.0 0.3 - 1.2 mg/dL   GFR calc non Af Amer 43 (L) >60 mL/min   GFR calc Af Amer 50 (L) >60 mL/min   Anion gap 19 (H) 5 - 15    Comment: Performed at [facility], [address], [place], [place] [address]  CBC WITH DIFFERENTIAL     Status: Abnormal   Collection Time: 03/31/20  7:00 PM  Result Value Ref Range   WBC 42.8 (H) 4.0 - 10.5 K/uL   RBC 3.77 (L) 4.22 - 5.81 MIL/uL   Hemoglobin 10.0 (L) 13.0 - 17.0 g/dL   HCT 31.9 (L) 39 - 52 %   MCV 84.6 80.0 - 100.0 fL   MCH 26.5 26.0 - 34.0 pg   MCHC 31.3 30.0 - 36.0 g/dL   RDW 17.0 (H) 11.5 - 15.5 %   Platelets 783 (H) 150 - 400 K/uL   nRBC 0.0 0.0 - 0.2 %   Neutrophils Relative % 91 %   Neutro Abs 39.1 (H) 1.7 - 7.7 K/uL   Lymphocytes Relative 3 %   Lymphs Abs 1.1 0.7 - 4.0 K/uL   Monocytes Relative 3 %   Monocytes Absolute 1.2 (H) 0 - 1 K/uL   Eosinophils Relative 0 %   Eosinophils Absolute 0.0 0 - 0 K/uL   Basophils Relative 0 %   Basophils Absolute 0.1 0 - 0 K/uL   WBC Morphology INCREASED BANDS (>20% BANDS)    Immature Granulocytes 3 %   Abs Immature Granulocytes 1.38 (H)  0.00 - 0.07 K/uL    Comment: Performed at [facility], [address], [place], [place] [address]  Protime-INR     Status: Abnormal   Collection Time: 03/31/20  7:00 PM  Result Value Ref Range   Prothrombin Time 19.8 (H) 11.4 - 15.2 seconds   INR 1.8 (H) 0.8 - 1.2    Comment: (NOTE) INR goal varies based on device and disease states. Performed at [facility], [address], [place], [place] [address]   APTT     Status: None   Collection Time: 03/31/20  7:00 PM  Result Value Ref Range   aPTT 36 24 - 36 seconds    Comment: Performed at [facility], [address], [place], [place] [address]  Blood culture (routine single)     Status: None (Preliminary result)   Collection Time: 03/31/20  7:00 PM   Specimen: Left Antecubital; Blood  Result Value Ref Range   Specimen Description      LEFT ANTECUBITAL Performed at [facility], [address], [place], [place] [address]    Special Requests      BOTTLES DRAWN AEROBIC AND ANAEROBIC Blood Culture adequate volume Performed at [address] [place], [place] [address]    Culture  Setup Time      GRAM NEGATIVE RODS ANAEROBIC BOTTLE Gram Stain Report Called to,Read Back By and Verified With: [email or web address]  BY MATTHEWS, B 9.7.21 [place] [facility] GRAM STAIN REVIEWED-AGREE WITH RESULT    Culture      CULTURE REINCUBATED FOR BETTER GROWTH Performed at [facility], [address], [place], [place] [address]    Report Status PENDING   Lipase, blood     Status: None   Collection Time: 03/31/20  7:00 PM  Result Value Ref Range   Lipase 41 11 - 51 U/L    Comment: Performed at [facility], [address] [place], [place] [address]  SARS Coronavirus 2 by RT PCR (hospital order, performed in [facility] hospital lab) Nasopharyngeal Nasopharyngeal Swab     Status: None   Collection Time: 03/31/20  7:00 PM   Specimen: Nasopharyngeal Swab  Result Value Ref Range   SARS Coronavirus 2 NEGATIVE NEGATIVE    Comment:  (NOTE) SARS-CoV-2 target nucleic acids are NOT DETECTED.  The SARS-CoV-2 RNA is generally detectable in upper and lower respiratory specimens during the acute phase of infection. The lowest concentration of SARS-CoV-2 viral copies this assay can detect is 250 copies / mL. A negative result does not preclude SARS-CoV-2 infection and should not be used as the sole basis for treatment or other patient management decisions.  A negative result may occur with improper specimen collection / handling, submission of specimen other than nasopharyngeal swab, presence of viral mutation(s) within the areas targeted by this assay, and inadequate number of viral copies (<250 copies / mL). A negative result must be combined with clinical observations, patient history, and epidemiological information.  Fact Sheet for Patients:   StrictlyIdeas.no  Fact Sheet for Healthcare Providers: BankingDealers.co.za  This test is not yet approved or  cleared by the [place] FDA and has been authorized for detection and/or diagnosis of SARS-CoV-2 by FDA under an Emergency Use Authorization (EUA).  This EUA will remain in effect (meaning this test can be used) for the duration of the COVID-19 declaration under Section 564(b)(1) of the Act, 21 U.S.C. section 360bbb-3(b)(1), unless the authorization is terminated or revoked sooner.  Performed at [facility], [address], [place], [place] [address]   Blood Culture ID Panel (Reflexed)     Status: None   Collection Time: 03/31/20  7:00 PM  Result Value Ref Range   Enterococcus  faecalis NOT DETECTED NOT DETECTED   Enterococcus Faecium NOT DETECTED NOT DETECTED   Listeria monocytogenes NOT DETECTED NOT DETECTED   Staphylococcus species NOT DETECTED NOT DETECTED   Staphylococcus aureus (BCID) NOT DETECTED NOT DETECTED   Staphylococcus epidermidis NOT DETECTED NOT DETECTED   Staphylococcus lugdunensis NOT DETECTED  NOT DETECTED   Streptococcus species NOT DETECTED NOT DETECTED   Streptococcus agalactiae NOT DETECTED NOT DETECTED   Streptococcus pneumoniae NOT DETECTED NOT DETECTED   Streptococcus pyogenes NOT DETECTED NOT DETECTED   A.calcoaceticus-baumannii NOT DETECTED NOT DETECTED   Bacteroides fragilis NOT DETECTED NOT DETECTED   Enterobacterales NOT DETECTED NOT DETECTED   Enterobacter cloacae complex NOT DETECTED NOT DETECTED   Escherichia coli NOT DETECTED NOT DETECTED   Klebsiella aerogenes NOT DETECTED NOT DETECTED   Klebsiella oxytoca NOT DETECTED NOT DETECTED   Klebsiella pneumoniae NOT DETECTED NOT DETECTED   Proteus species NOT DETECTED NOT DETECTED   Salmonella species NOT DETECTED NOT DETECTED   Serratia marcescens NOT DETECTED NOT DETECTED   Haemophilus influenzae NOT DETECTED NOT DETECTED   Neisseria meningitidis NOT DETECTED NOT DETECTED   Pseudomonas aeruginosa NOT DETECTED NOT DETECTED   Stenotrophomonas maltophilia NOT DETECTED NOT DETECTED   Candida albicans NOT DETECTED NOT DETECTED   Candida auris NOT DETECTED NOT DETECTED   Candida glabrata NOT DETECTED NOT DETECTED   Candida krusei NOT DETECTED NOT DETECTED   Candida parapsilosis NOT DETECTED NOT DETECTED   Candida tropicalis NOT DETECTED NOT DETECTED   Cryptococcus neoformans/gattii NOT DETECTED NOT DETECTED    Comment: Performed at [facility], [address] [place], [place] [address]  Lactic acid, plasma     Status: Abnormal   Collection Time: 03/31/20  8:55 PM  Result Value Ref Range   Lactic Acid, Venous 5.7 (HH) 0.5 - 1.9 mmol/L    Comment: CRITICAL VALUE NOTED.  VALUE IS CONSISTENT WITH PREVIOUSLY REPORTED AND CALLED VALUE. Performed at [facility] [address] [place], [place] [address]   Culture, blood (single)     Status: None (Preliminary result)   Collection Time: 03/31/20  8:55 PM   Specimen: BLOOD  Result Value Ref Range   Specimen Description BLOOD RIGHT ANTECUBITAL    Special  Requests      BOTTLES DRAWN AEROBIC AND ANAEROBIC Blood Culture adequate volume   Culture      NO GROWTH 2 DAYS Performed at [facility], [address], [place], [place] [address]    Report Status PENDING   Lactic acid, plasma     Status: Abnormal   Collection Time: 03/31/20 11:36 PM  Result Value Ref Range   Lactic Acid, Venous 4.1 (HH) 0.5 - 1.9 mmol/L    Comment: CRITICAL VALUE NOTED.  VALUE IS CONSISTENT WITH PREVIOUSLY REPORTED AND CALLED VALUE. Performed at [facility], [address], [place], [place] [address]   Basic metabolic panel     Status: Abnormal   Collection Time: 03/31/20 11:36 PM  Result Value Ref Range   Sodium 128 (L) 135 - 145 mmol/L   Potassium 4.5 3.5 - 5.1 mmol/L   Chloride 98 98 - 111 mmol/L   CO2 16 (L) 22 - 32 mmol/L   Glucose, Bld 109 (H) 70 - 99 mg/dL    Comment: Glucose reference range applies only to samples taken after fasting for at least 8 hours.   BUN 24 (H) 8 - 23 mg/dL   Creatinine, Ser 1.62 (H) 0.61 - 1.24 mg/dL   Calcium 8.0 (L) 8.9 -  10.3 mg/dL   GFR calc non Af Amer 41 (L) >60 mL/min   GFR calc Af Amer 47 (L) >60 mL/min   Anion gap 14 5 - 15    Comment: Performed at [facility], [address], [place], [place] [address]  Blood gas, venous (at [facility], not at [facility])     Status: Abnormal   Collection Time: 03/31/20 11:36 PM  Result Value Ref Range   FIO2 28.00    pH, Ven 7.365 7.25 - 7.43   pCO2, Ven 30.8 (L) 44 - 60 mmHg   pO2, Ven 67.1 (H) 32 - 45 mmHg   Bicarbonate 18.7 (L) 20.0 - 28.0 mmol/L   Acid-base deficit 7.2 (H) 0.0 - 2.0 mmol/L   O2 Saturation 90.2 %   Patient temperature 37.0    Collection site VENOUS    Drawn by 1528     Comment: Performed at [facility], [address], [place], [place] [address]  Urinalysis, Routine w reflex microscopic     Status: Abnormal   Collection Time: 04/01/20 12:44 AM  Result Value Ref Range   Color, Urine YELLOW YELLOW   APPearance HAZY (A) CLEAR   Specific Gravity, Urine 1.017 1.005  - 1.030   pH 7.0 5.0 - 8.0   Glucose, UA 50 (A) NEGATIVE mg/dL   Hgb urine dipstick SMALL (A) NEGATIVE   Bilirubin Urine NEGATIVE NEGATIVE   Ketones, ur NEGATIVE NEGATIVE mg/dL   Protein, ur 30 (A) NEGATIVE mg/dL   Nitrite NEGATIVE NEGATIVE   Leukocytes,Ua NEGATIVE NEGATIVE   RBC / HPF 0-5 0 - 5 RBC/hpf   WBC, UA 6-10 0 - 5 WBC/hpf   Bacteria, UA NONE SEEN NONE SEEN    Comment: Performed at [facility], [address] [place], [place] [address]  Urine culture     Status: Abnormal   Collection Time: 04/01/20 12:44 AM   Specimen: In/Out Cath Urine  Result Value Ref Range   Specimen Description      IN/OUT CATH URINE Performed at [facility], [address], [place], [place] [address]    Special Requests      NONE Performed at [facility], [address] [place], [place] [address]    Culture (A)     <10,000 COLONIES/mL INSIGNIFICANT GROWTH Performed at [place] [address], [place], [place] [address]    Report Status 04/02/2020 FINAL   MRSA PCR Screening     Status: None   Collection Time: 04/01/20  1:54 AM   Specimen: Nasopharyngeal  Result Value Ref Range   MRSA by PCR NEGATIVE NEGATIVE    Comment:        The GeneXpert MRSA Assay (FDA approved for NASAL specimens only), is one component of a comprehensive MRSA colonization surveillance program. It is not intended to diagnose MRSA infection nor to guide or monitor treatment for MRSA infections. Performed at [facility], [address], [place],  [address]   CBC with Differential     Status: Abnormal   Collection Time: 04/01/20  3:23 AM  Result Value Ref Range   WBC 42.4 (H) 4.0 - 10.5 K/uL   RBC 3.25 (L) 4.22 - 5.81 MIL/uL   Hemoglobin 8.8 (L) 13.0 - 17.0 g/dL   HCT 27.6 (L) 39 - 52 %   MCV 84.9 80.0 - 100.0 fL   MCH 27.1 26.0 - 34.0 pg   MCHC 31.9 30.0 - 36.0 g/dL   RDW 17.2 (H) 11.5 - 15.5 %  Platelets 577 (H) 150 - 400 K/uL   nRBC 0.0 0.0 - 0.2 %   Neutrophils Relative % 90 %    Neutro Abs 38.2 (H) 1.7 - 7.7 K/uL   Lymphocytes Relative 3 %   Lymphs Abs 1.1 0.7 - 4.0 K/uL   Monocytes Relative 3 %   Monocytes Absolute 1.3 (H) 0 - 1 K/uL   Eosinophils Relative 0 %   Eosinophils Absolute 0.0 0 - 0 K/uL   Basophils Relative 0 %   Basophils Absolute 0.1 0 - 0 K/uL   WBC Morphology INCREASED BANDS (>20% BANDS)     Comment: MILD LEFT SHIFT (1-5% METAS, OCC MYELO, OCC BANDS) TOXIC GRANULATION    Immature Granulocytes 4 %   Abs Immature Granulocytes 1.77 (H) 0.00 - 0.07 K/uL    Comment: Performed at [facility], [address], [place], [place] [address]  Comprehensive metabolic panel     Status: Abnormal   Collection Time: 04/01/20  3:23 AM  Result Value Ref Range   Sodium 129 (L) 135 - 145 mmol/L   Potassium 5.0 3.5 - 5.1 mmol/L   Chloride 99 98 - 111 mmol/L   CO2 17 (L) 22 - 32 mmol/L   Glucose, Bld 122 (H) 70 - 99 mg/dL    Comment: Glucose reference range applies only to samples taken after fasting for at least 8 hours.   BUN 27 (H) 8 - 23 mg/dL   Creatinine, Ser 1.71 (H) 0.61 - 1.24 mg/dL   Calcium 7.8 (L) 8.9 - 10.3 mg/dL   Total Protein 6.2 (L) 6.5 - 8.1 g/dL   Albumin 2.2 (L) 3.5 - 5.0 g/dL   AST 39 15 - 41 U/L   ALT 11 0 - 44 U/L   Alkaline Phosphatase 70 38 - 126 U/L   Total Bilirubin 1.3 (H) 0.3 - 1.2 mg/dL   GFR calc non Af Amer 38 (L) >60 mL/min   GFR calc Af Amer 44 (L) >60 mL/min   Anion gap 13 5 - 15    Comment: Performed at [facility], [address], [place], [place] [address]  Lactic acid, plasma     Status: Abnormal   Collection Time: 04/01/20  3:23 AM  Result Value Ref Range   Lactic Acid, Venous 2.3 (HH) 0.5 - 1.9 mmol/L    Comment: CRITICAL VALUE NOTED.  VALUE IS CONSISTENT WITH PREVIOUSLY REPORTED AND CALLED VALUE. Performed at [facility], [address] [place], [place] [address]   CBC with Differential     Status: Abnormal   Collection Time: 04/02/20  5:31 AM  Result Value Ref Range   WBC 25.4 (H) 4.0 - 10.5 K/uL   RBC 2.83  (L) 4.22 - 5.81 MIL/uL   Hemoglobin 7.5 (L) 13.0 - 17.0 g/dL   HCT 23.5 (L) 39 - 52 %   MCV 83.0 80.0 - 100.0 fL   MCH 26.5 26.0 - 34.0 pg   MCHC 31.9 30.0 - 36.0 g/dL   RDW 17.6 (H) 11.5 - 15.5 %   Platelets 506 (H) 150 - 400 K/uL   nRBC 0.0 0.0 - 0.2 %   Neutrophils Relative % 90 %   Neutro Abs 23.1 (H) 1.7 - 7.7 K/uL   Lymphocytes Relative 5 %   Lymphs Abs 1.2 0.7 - 4.0 K/uL   Monocytes Relative 3 %   Monocytes Absolute 0.7 0 - 1 K/uL   Eosinophils Relative 0 %   Eosinophils Absolute 0.0 0 - 0 K/uL  Basophils Relative 0 %   Basophils Absolute 0.0 0 - 0 K/uL   WBC Morphology TOXIC GRANULATION     Comment: WHITE COUNT CONFIRMED   Immature Granulocytes 2 %   Abs Immature Granulocytes 0.38 (H) 0.00 - 0.07 K/uL    Comment: Performed at [facility], [address], [place], [place] [address]  Comprehensive metabolic panel     Status: Abnormal   Collection Time: 04/02/20  5:31 AM  Result Value Ref Range   Sodium 132 (L) 135 - 145 mmol/L   Potassium 4.1 3.5 - 5.1 mmol/L    Comment: DELTA CHECK NOTED   Chloride 104 98 - 111 mmol/L   CO2 17 (L) 22 - 32 mmol/L   Glucose, Bld 104 (H) 70 - 99 mg/dL    Comment: Glucose reference range applies only to samples taken after fasting for at least 8 hours.   BUN 28 (H) 8 - 23 mg/dL   Creatinine, Ser 1.35 (H) 0.61 - 1.24 mg/dL   Calcium 7.9 (L) 8.9 - 10.3 mg/dL   Total Protein 5.8 (L) 6.5 - 8.1 g/dL   Albumin 2.0 (L) 3.5 - 5.0 g/dL   AST 21 15 - 41 U/L   ALT 15 0 - 44 U/L   Alkaline Phosphatase 60 38 - 126 U/L   Total Bilirubin 0.8 0.3 - 1.2 mg/dL   GFR calc non Af Amer 51 (L) >60 mL/min   GFR calc Af Amer 59 (L) >60 mL/min   Anion gap 11 5 - 15    Comment: Performed at [facility], [address], [place], [place] [address]  C Difficile Quick Screen w PCR reflex     Status: None   Collection Time: 04/02/20  1:14 PM   Specimen: Stool  Result Value Ref Range   C Diff antigen NEGATIVE NEGATIVE   C Diff toxin NEGATIVE NEGATIVE   C  Diff interpretation No C. difficile detected.     Comment: Performed at [facility], [address], [place], [place] [address]      RADIOGRAPHY: CT Head Wo Contrast  Result Date: 03/31/2020 CLINICAL DATA:  Seizure EXAM: CT HEAD WITHOUT CONTRAST TECHNIQUE: Contiguous axial images were obtained from the base of the skull through the vertex without intravenous contrast. COMPARISON:  11/23/2019 FINDINGS: Brain: There is atrophy and chronic small vessel disease changes. No acute intracranial abnormality. Specifically, no hemorrhage, hydrocephalus, mass lesion, acute infarction, or significant intracranial injury. Vascular: No hyperdense vessel or unexpected calcification. Skull: No acute calvarial abnormality. Sinuses/Orbits: Visualized paranasal sinuses and mastoids clear. Orbital soft tissues unremarkable. Other: None IMPRESSION: Atrophy, chronic microvascular disease. No acute intracranial abnormality. Electronically Signed   By: Rolm Baptise M.D.   On: 03/31/2020 22:03   CT ABDOMEN PELVIS W CONTRAST  Result Date: 03/31/2020 CLINICAL DATA:  Bowel obstruction suspected. Constipation, vomiting and generalized abdominal pain. Syncopal episode. EXAM: CT ABDOMEN AND PELVIS WITH CONTRAST TECHNIQUE: Multidetector CT imaging of the abdomen and pelvis was performed using the standard protocol following bolus administration of intravenous contrast. CONTRAST:  65mL OMNIPAQUE IOHEXOL 300 MG/ML  SOLN COMPARISON:  Radiograph earlier this day. Abdominopelvic CT 11/22/2019 FINDINGS: Lower chest: No basilar consolidation or pleural effusion. There are coronary artery calcifications. Fluid distending the distal esophagus. Hepatobiliary: Unchanged low-density lesions in the right lobe of the liver. No new hepatic lesion. Gallbladder physiologically distended, no calcified stone. No biliary dilatation. Pancreas: Parenchymal atrophy. No ductal dilatation or inflammation. Spleen: Normal in size without focal abnormality.  Adrenals/Urinary Tract: No  adrenal nodule. No hydronephrosis. Tiny cortical hypodensity in the upper left mid lower right kidney too small to characterize but likely small cysts. No significant perinephric edema. No renal or ureteral calculi. There is absent renal excretion on delayed phase imaging. Nondistended but thick walled urinary bladder. Stomach/Bowel: Fluid distending the distal esophagus. Fluid distending the stomach. No gastric wall thickening. Small bowel loops are diffusely fluid-filled but no transition point. There is fecalization of small bowel contents involving the distal ileum. No definite wall thickening or perienteric edema. Normal appendix. Mild colonic distension with liquid and solid stool. There is formed stool in the sigmoid colon. Stool distends the rectum with rectal distention of 5.1 cm. No perirectal inflammation. No definite colonic wall thickening. Vascular/Lymphatic: Advanced aortic and branch atherosclerosis. Ectatic infrarenal aorta at 2.5 cm. Ectatic common iliac arteries with moderate atherosclerosis. The portal vein is patent. No acute vascular findings are seen. 12 mm left inguinal node, series 2, image 80, new from prior exam. Scattered additional prominent bilateral inguinal and external iliac nodes. No retroperitoneal, upper abdominal, or more mesenteric adenopathy. 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 Other: Trace free fluid in the dependent pelvis is likely reactive. There is no upper abdominal ascites. No free air or intra-abdominal abscess. Tiny fat containing umbilical hernia. Musculoskeletal: Advanced degenerative change throughout the thoracic spine. T12 superior endplate compression fracture is unchanged from prior. IMPRESSION: 1. Fluid distending the distal esophagus, stomach, and small bowel loops with fecalization of small bowel contents. Mixed liquid and solid stool throughout the colon, with formed stool distending the rectum. Query fecal  impaction. Findings most consistent with generalized slow transit/constipation. No evidence of bowel obstruction or inflammation. 2. Nondistended but thick walled urinary bladder, can be seen with cystitis. Recommend correlation with urinalysis. 3. Enlarged left inguinal node is new from prior exam, nonspecific. 4. Absent renal excretion on delayed phase imaging suggests underlying renal dysfunction. Aortic Atherosclerosis (ICD10-I70.0). Electronically Signed   By: Keith Rake M.D.   On: 03/31/2020 22:07   DG Abdomen Acute W/Chest  Result Date: 03/31/2020 CLINICAL DATA:  Hypotension and tachycardia. Evaluate for bowel obstruction. EXAM: DG ABDOMEN ACUTE W/ 1V CHEST COMPARISON:  11/23/2019 chest radiograph FINDINGS: Frontal view of the chest demonstrates midline trachea. Normal heart size. Atherosclerosis in the transverse aorta. No pleural effusion or pneumothorax. Clear lungs. Abdominal films demonstrate gas and stool filled colon, including up to 6.8 cm. Stool within the rectum at up to 6.0 cm. No small bowel dilatation. No abnormal abdominal calcifications. No appendicolith. IMPRESSION: Moderate amount of stool within the rectum, possibly representing constipation or fecal impaction. Borderline gaseous distension of the colon without small bowel obstruction. No acute process in the chest. Aortic Atherosclerosis (ICD10-I70.0). Electronically Signed   By: Abigail Miyamoto M.D.   On: 03/31/2020 19:30       ASSESSMENT and PLAN:  1.  Leukemoid reaction: -CBC at presentation of 42 with the predominantly left shift, 90% neutrophils. -Today CBC improved to 25.4.  However he developed diarrhea since he was hospitalized. -C. difficile is negative.  No clear source of infection at this time.  GI panel still pending. -CT scan of the abdomen and pelvis on admission reviewed by me did not show any splenomegaly.  No clear source of infection. -I have reviewed the peripheral blood smear which showed left shift with  bandemia. -Blood cultures are negative.  Urine culture shows less than 10,000. -We will check LDH in the morning.  We will follow up on white count  tomorrow.  2.  Acute kidney injury: -Presented with creatinine of 1.5, improved today to 1.35.  He is receiving IV fluids.  3.  Thrombocytosis: -Platelet count also improved from 783-506 today. -Most likely reactive.  All questions were answered. The patient knows to call the clinic with any problems, questions or concerns. We can certainly see the patient much sooner if necessary.    Derek Jack

## 2020-04-03 LAB — IRON AND TIBC
Iron: 19 ug/dL — ABNORMAL LOW (ref 45–182)
Saturation Ratios: 12 % — ABNORMAL LOW (ref 17.9–39.5)
TIBC: 152 ug/dL — ABNORMAL LOW (ref 250–450)
UIBC: 133 ug/dL

## 2020-04-03 LAB — GASTROINTESTINAL PANEL BY PCR, STOOL (REPLACES STOOL CULTURE)

## 2020-04-03 LAB — COMPREHENSIVE METABOLIC PANEL
ALT: 11 U/L (ref 0–44)
AST: 20 U/L (ref 15–41)
Albumin: 1.9 g/dL — ABNORMAL LOW (ref 3.5–5.0)
Alkaline Phosphatase: 55 U/L (ref 38–126)
Anion gap: 9 (ref 5–15)
BUN: 21 mg/dL (ref 8–23)
CO2: 18 mmol/L — ABNORMAL LOW (ref 22–32)
Calcium: 7.9 mg/dL — ABNORMAL LOW (ref 8.9–10.3)
Chloride: 104 mmol/L (ref 98–111)
Creatinine, Ser: 0.97 mg/dL (ref 0.61–1.24)
GFR calc Af Amer: >60 mL/min (ref >60)
GFR calc non Af Amer: >60 mL/min (ref >60)
Glucose, Bld: 98 mg/dL (ref 70–99)
Potassium: 3.5 mmol/L (ref 3.5–5.1)
Sodium: 131 mmol/L — ABNORMAL LOW (ref 135–145)
Total Bilirubin: 0.5 mg/dL (ref 0.3–1.2)
Total Protein: 5.5 g/dL — ABNORMAL LOW (ref 6.5–8.1)

## 2020-04-03 LAB — FOLATE: Folate: 10.4 ng/mL (ref >5.9)

## 2020-04-03 LAB — RETICULOCYTES
Immature Retic Fract: 30.7 % — ABNORMAL HIGH (ref 2.3–15.9)
RBC.: 2.68 MIL/uL — ABNORMAL LOW (ref 4.22–5.81)
Retic Count, Absolute: 55.7 10*3/uL (ref 19.0–186.0)
Retic Ct Pct: 2.1 % (ref 0.4–3.1)

## 2020-04-03 LAB — CBC WITH DIFFERENTIAL/PLATELET
Abs Immature Granulocytes: 0.44 10*3/uL — ABNORMAL HIGH (ref 0.00–0.07)
Basophils Absolute: 0 10*3/uL (ref 0.0–0.1)
Basophils Relative: 0 %
Eosinophils Absolute: 0.1 10*3/uL (ref 0.0–0.5)
Eosinophils Relative: 1 %
HCT: 21.6 % — ABNORMAL LOW (ref 39.0–52.0)
Hemoglobin: 6.9 g/dL — CL (ref 13.0–17.0)
Immature Granulocytes: 2 %
Lymphocytes Relative: 7 %
Lymphs Abs: 1.5 10*3/uL (ref 0.7–4.0)
MCH: 26.7 pg (ref 26.0–34.0)
MCHC: 31.9 g/dL (ref 30.0–36.0)
MCV: 83.7 fL (ref 80.0–100.0)
Monocytes Absolute: 1 10*3/uL (ref 0.1–1.0)
Monocytes Relative: 4 %
Neutro Abs: 20.2 10*3/uL — ABNORMAL HIGH (ref 1.7–7.7)
Neutrophils Relative %: 86 %
Platelets: 442 10*3/uL — ABNORMAL HIGH (ref 150–400)
RBC: 2.58 MIL/uL — ABNORMAL LOW (ref 4.22–5.81)
RDW: 17.8 % — ABNORMAL HIGH (ref 11.5–15.5)
WBC Morphology: INCREASED
WBC: 23.4 10*3/uL — ABNORMAL HIGH (ref 4.0–10.5)
nRBC: 0 % (ref 0.0–0.2)

## 2020-04-03 LAB — PREPARE RBC (CROSSMATCH)

## 2020-04-03 LAB — VITAMIN B12: Vitamin B-12: 470 pg/mL (ref 180–914)

## 2020-04-03 LAB — FERRITIN: Ferritin: 147 ng/mL (ref 24–336)

## 2020-04-03 LAB — MAGNESIUM: Magnesium: 1.7 mg/dL (ref 1.7–2.4)

## 2020-04-03 LAB — LACTATE DEHYDROGENASE: LDH: 104 U/L (ref 98–192)

## 2020-04-03 LAB — HEMOGLOBIN AND HEMATOCRIT, BLOOD
HCT: 29.5 % — ABNORMAL LOW (ref 39.0–52.0)
Hemoglobin: 9.5 g/dL — ABNORMAL LOW (ref 13.0–17.0)

## 2020-04-03 LAB — LACTIC ACID, PLASMA: Lactic Acid, Venous: 0.6 mmol/L (ref 0.5–1.9)

## 2020-04-03 MED ORDER — DIPHENHYDRAMINE HCL 25 MG PO CAPS
25.0000 mg | ORAL_CAPSULE | Freq: Once | ORAL | Status: AC
  Administered 2020-04-03: 25 mg via ORAL
  Filled 2020-04-03: qty 1

## 2020-04-03 MED ORDER — SODIUM CHLORIDE 0.9 % IV SOLN
2.0000 g | Freq: Three times a day (TID) | INTRAVENOUS | Status: DC
  Administered 2020-04-03 – 2020-04-04 (×4): 2 g via INTRAVENOUS
  Filled 2020-04-03 (×4): qty 2

## 2020-04-03 MED ORDER — SODIUM CHLORIDE 0.9% IV SOLUTION: Freq: Once | INTRAVENOUS | Status: DC

## 2020-04-03 MED ORDER — POTASSIUM CHLORIDE CRYS ER 20 MEQ PO TBCR
40.0000 meq | EXTENDED_RELEASE_TABLET | Freq: Once | ORAL | Status: AC
  Administered 2020-04-03: 40 meq via ORAL
  Filled 2020-04-03: qty 2

## 2020-04-03 MED ORDER — DOXYLAMINE SUCCINATE (SLEEP) 25 MG PO TABS
25.0000 mg | ORAL_TABLET | Freq: Once | ORAL | Status: DC
  Filled 2020-04-03: qty 1

## 2020-04-03 MED ORDER — SODIUM CHLORIDE 0.9 % IV SOLN: INTRAVENOUS | Status: AC

## 2020-04-03 MED ORDER — PANTOPRAZOLE SODIUM 40 MG IV SOLR
40.0000 mg | INTRAVENOUS | Status: DC
  Administered 2020-04-03 – 2020-04-10 (×8): 40 mg via INTRAVENOUS
  Filled 2020-04-03 (×9): qty 40

## 2020-04-03 NOTE — Progress Notes (Signed)
CRITICAL VALUE ALERT  Critical Value:  Hgb 6.9  Date & Time Notied:  04/03/20 7:35 AM  Provider Notified: Dr. Manuella Ghazi  Orders Received/Actions taken: Awaiting new orders

## 2020-04-03 NOTE — Progress Notes (Signed)
PT Cancellation Note  Patient Details Name: COYT GOVONI MRN: [id number] DOB: 20-Aug-1945   Cancelled Treatment:    Reason Eval/Treat Not Completed: Fatigue/lethargy limiting ability to participate. HgB 6.9 - patient currently being tranfused. Nursing gave the approval to continue with evaluation if patient was agreeable. Patient declined.   Floria Raveling. Hartnett-Rands, MS, PT Per [facility] #[id number] 04/03/2020, 12:22 PM

## 2020-04-03 NOTE — Progress Notes (Signed)
Pharmacy Antibiotic Note  Nathan [name] is a 74 y.o. male admitted on 03/31/2020 with sepsis, source unknown.  Pharmacy has been consulted for  cefepime dosing.   Plan: Cefepime 2 gm IV Q 8 hrs Monitor WBC, temp, clinical improvement, cultures, vancomycin levels as indicated  Height: 5\' 11"  (180.3 cm) Weight: 86.4 kg (190 lb 7.6 oz) IBW/kg (Calculated) : 75.3  Temp (24hrs), Avg:98.5 F (36.9 C), Min:98 F (36.7 C), Max:99.3 F (37.4 C)  Recent Labs  Lab 03/31/20 1900 03/31/20 2055 03/31/20 2336 04/01/20 0323 04/02/20 0531 04/03/20 0604  WBC 42.8*  --   --  42.4* 25.4* 23.4*  CREATININE 1.55*  --  1.62* 1.71* 1.35* 0.97  LATICACIDVEN 8.0* 5.7* 4.1* 2.3*  --  0.6    Estimated Creatinine Clearance: 70.1 mL/min (by C-G formula based on SCr of 0.97 mg/dL).    Allergies  Allergen Reactions  . Terazosin Other (See Comments)    Syncope  . Terazosin Other (See Comments)    unknown    Antimicrobials this admission: 9/6 vancomycin >> 9/7 9/6 cefepime >>  9/6 metronidazole >>  Microbiology results: 9/6 BCx X 1: gram neg rods in anaerobic bottle   Thank you for allowing pharmacy to be a part of this patient's care.  Margot Ables, PharmD Clinical Pharmacist 04/03/2020 11:15 AM

## 2020-04-03 NOTE — Progress Notes (Addendum)
PROGRESS NOTE    Nathan [name]  TFT:[id number] DOB: February 11, 1946 DOA: 03/31/2020 PCP: Patient, No Pcp Per   Brief Narrative:  Per HPI: Nathan [name] a 74 y.o.malewith medical history significant ofDVT, GERD, hypertension, hyperlipidemia, PTSD, chronic renal insufficiency, history of seizures (last one was in 2004) who is coming to the emergency department due toprogressively worse abdominal pain associated with constipation, decreased oral intake, fatigue, malaise and postural dizziness for the past 3 days. He also had 2 episodes of nonbloody emesis in the past 24 hours. He denies melena or hematochezia. No dysuria, frequency or hematuria. However, he states that he has been urinating less than usual. He denies fever, chills, night sweats, rhinorrhea, sore throat, dyspnea, wheezing or hemoptysis. No chest pain, palpitations, diaphoresis, PND, orthopnea or recent pitting edema of the lower extremities. He denies polyuria, polydipsia, polyphagia or blurred vision.  9/8: Patient seen and evaluated this morning.  Multiple loose, liquidy stools noted overnight.  Plan to check C. difficile and GI panel.  Leukocytosis improving on current antibiotics as well as other lab work.  Hematology consultation requested.  9/9: Patient continues have ongoing diarrhea, but C. difficile testing negative.  GI panel pending.  Noted to have worsening anemia with no overt bleeding noted.  Stool occult ordered and pending.  2 unit PRBC transfusion planned.  Hematology consultation on 9/8 with likely leukemoid reaction noted. Hold Eliquis.  Assessment & Plan:   Principal Problem:   Sepsis due to undetermined organism [facility]) Active Problems:   Seizure ([place])   HTN (hypertension)   GERD (gastroesophageal reflux disease)   Hyponatremia   Lactic acidosis   AKI (acute kidney injury) ([place])   Normocytic anemia   Thrombocytosis ([facility])   Hyperlipidemia   Hyperglycemia   Sepsis on admission of unknown  etiology -Patient now with diarrhea and C. difficile testing negative.  GI panel pending. -Blood cultures thus far negative -Urine cultures with insignificant growth -Continue current antibiotics  Acute anemia -Anemia panel ordered as well as stool occult -2 unit PRBC transfusion 9/9  Ongoing diarrhea -Cdiff negative -GI panel pending -Imodium prn  Lactic acidosis secondary to above-resolved  AKI-resolved -Likely secondary to sepsis physiology -Creatinine at baseline near 0.9 and currently 0.97 -Continue to monitor -Maintain on further IV fluid given ongoing diarrhea  Hyponatremia -Likely related to GI losses -Continue normal saline as ordered -Recheck labs in a.m.  Seizure history -Continue oxycarbamazepine -Continue Sinemet  Hypertension -Hold lisinopril and start metoprolol -Labetalol prn  GERD -PPI -We will hold if positive for C. Difficile  Likely leukemoid reaction -Appreciate hematology evaluation -Continue current antibiotics as ordered  Dyslipidemia -Resume home statin  History of prior DVT -Hold Eliquis for now with worsening anemia noted   DVT prophylaxis:Eliquis changed to SCDs Code Status: Full Family Communication: Discussed with wife 9/8, 9/9 Disposition Plan:  Status is: Inpatient  Remains inpatient appropriate because:IV treatments appropriate due to intensity of illness or inability to take PO and Inpatient level of care appropriate due to severity of illness   Dispo: The patient is from: Home  Anticipated d/c is to: Home  Anticipated d/c date is: 2-3 days  Patient currently is not medically stable to d/c.He is requiring PRBC transfusion 9/9 and is having ongoing diarrhea.  Consultants:   Heme/Onc  Procedures:   See below  Antimicrobials:  Anti-infectives (From admission, onward)   Start     Dose/Rate Route Frequency Ordered Stop   04/03/20 1000  ceFEPIme (MAXIPIME) 2 g  in sodium chloride  0.9 % 100 mL IVPB        2 g 200 mL/hr over 30 Minutes Intravenous Every 8 hours 04/03/20 0909     04/01/20 0830  vancomycin (VANCOREADY) IVPB 750 mg/150 mL  Status:  Discontinued        750 mg 150 mL/hr over 60 Minutes Intravenous Every 12 hours 03/31/20 2024 04/01/20 1004   04/01/20 0400  metroNIDAZOLE (FLAGYL) IVPB 500 mg        500 mg 100 mL/hr over 60 Minutes Intravenous Every 8 hours 04/01/20 0026     03/31/20 2030  ceFEPIme (MAXIPIME) 2 g in sodium chloride 0.9 % 100 mL IVPB  Status:  Discontinued        2 g 200 mL/hr over 30 Minutes Intravenous Every 12 hours 03/31/20 2024 04/03/20 0909   03/31/20 2015  vancomycin (VANCOREADY) IVPB 2000 mg/400 mL        2,000 mg 200 mL/hr over 120 Minutes Intravenous  Once 03/31/20 2002 04/01/20 0027   03/31/20 2000  ceFEPIme (MAXIPIME) 2 g in sodium chloride 0.9 % 100 mL IVPB  Status:  Discontinued        2 g 200 mL/hr over 30 Minutes Intravenous  Once 03/31/20 1954 03/31/20 2024   03/31/20 2000  metroNIDAZOLE (FLAGYL) IVPB 500 mg        500 mg 100 mL/hr over 60 Minutes Intravenous  Once 03/31/20 1954 03/31/20 2159   03/31/20 2000  vancomycin (VANCOCIN) IVPB 1000 mg/200 mL premix  Status:  Discontinued        1,000 mg 200 mL/hr over 60 Minutes Intravenous  Once 03/31/20 1954 03/31/20 2002      Subjective: Patient seen and evaluated today with no new acute complaints or concerns. No acute concerns or events noted overnight.  Noted to have ongoing diarrhea as well as worsening anemia this morning.  He will require 2 unit PRBC transfusion.  No overt bleeding noted.  Objective: Vitals:   04/02/20 1600 04/02/20 1930 04/03/20 0134 04/03/20 0408  BP: (!) 177/74 (!) 167/84 (!) 143/74 (!) 146/66  Pulse: (!) 105 88 85 73  Resp: (!) 22 16 16 20   Temp: 98.5 F (36.9 C) 99.3 F (37.4 C) 98.7 F (37.1 C) 98.1 F (36.7 C)  TempSrc: Oral  Oral Oral  SpO2: 98% 98% 97% 97%  Weight:  86.4 kg    Height:  5\' 11"  (1.803 m)       Intake/Output Summary (Last 24 hours) at 04/03/2020 1020 Last data filed at 04/03/2020 0600 Gross per 24 hour  Intake 696.4 ml  Output 1100 ml  Net -403.6 ml   Filed Weights   04/01/20 0206 04/02/20 0500 04/02/20 1930  Weight: 86 kg 86 kg 86.4 kg    Examination:  General exam: Appears calm and comfortable  Respiratory system: Clear to auscultation. Respiratory effort normal. Cardiovascular system: S1 & S2 heard, RRR. Gastrointestinal system: Abdomen is nondistended, soft and nontender.  Central nervous system: Alert and oriented. No focal neurological deficits. Extremities: Symmetric 5 x 5 power. Skin: No rashes, lesions or ulcers Psychiatry: Judgement and insight appear normal. Mood & affect appropriate.     Data Reviewed: I have personally reviewed following labs and imaging studies  CBC: Recent Labs  Lab 03/31/20 1900 04/01/20 0323 04/02/20 0531 04/03/20 0604  WBC 42.8* 42.4* 25.4* 23.4*  NEUTROABS 39.1* 38.2* 23.1* 20.2*  HGB 10.0* 8.8* 7.5* 6.9*  HCT 31.9* 27.6* 23.5* 21.6*  MCV 84.6 84.9 83.0 83.7  PLT 783* 577*  506* 536*   Basic Metabolic Panel: Recent Labs  Lab 03/31/20 1900 03/31/20 2336 04/01/20 0323 04/02/20 0531 04/03/20 0604  NA 127* 128* 129* 132* 131*  K 4.5 4.5 5.0 4.1 3.5  CL 92* 98 99 104 104  CO2 16* 16* 17* 17* 18*  GLUCOSE 143* 109* 122* 104* 98  BUN 21 24* 27* 28* 21  CREATININE 1.55* 1.62* 1.71* 1.35* 0.97  CALCIUM 9.1 8.0* 7.8* 7.9* 7.9*  MG  --   --   --   --  1.7   GFR: Estimated Creatinine Clearance: 70.1 mL/min (by C-G formula based on SCr of 0.97 mg/dL). Liver Function Tests: Recent Labs  Lab 03/31/20 1900 04/01/20 0323 04/02/20 0531 04/03/20 0604  AST 54* 39 21 20  ALT 15 11 15 11   ALKPHOS 87 70 60 55  BILITOT 1.0 1.3* 0.8 0.5  PROT 7.6 6.2* 5.8* 5.5*  ALBUMIN 2.8* 2.2* 2.0* 1.9*   Recent Labs  Lab 03/31/20 1900  LIPASE 41   No results for input(s): AMMONIA in the last 168 hours. Coagulation  Profile: Recent Labs  Lab 03/31/20 1900  INR 1.8*   Cardiac Enzymes: No results for input(s): CKTOTAL, CKMB, CKMBINDEX, TROPONINI in the last 168 hours. BNP (last 3 results) No results for input(s): PROBNP in the last 8760 hours. HbA1C: No results for input(s): HGBA1C in the last 72 hours. CBG: No results for input(s): GLUCAP in the last 168 hours. Lipid Profile: No results for input(s): CHOL, HDL, LDLCALC, TRIG, CHOLHDL, LDLDIRECT in the last 72 hours. Thyroid Function Tests: No results for input(s): TSH, T4TOTAL, FREET4, T3FREE, THYROIDAB in the last 72 hours. Anemia Panel: Recent Labs    04/03/20 0841  VITAMINB12 470  FOLATE 10.4  FERRITIN 147  TIBC 152*  IRON 19*  RETICCTPCT 2.1   Sepsis Labs: Recent Labs  Lab 03/31/20 2055 03/31/20 2336 04/01/20 0323 04/03/20 0604  LATICACIDVEN 5.7* 4.1* 2.3* 0.6    Recent Results (from the past 240 hour(s))  Blood culture (routine single)     Status: None (Preliminary result)   Collection Time: 03/31/20  7:00 PM   Specimen: Left Antecubital; Blood  Result Value Ref Range Status   Specimen Description   Final    LEFT ANTECUBITAL Performed at [facility], [address] [place], [place] [address]    Special Requests   Final    BOTTLES DRAWN AEROBIC AND ANAEROBIC Blood Culture adequate volume Performed at [facility], [address] [place], [place] [address]    Culture  Setup Time   Final    GRAM NEGATIVE RODS ANAEROBIC BOTTLE Gram Stain Report Called to,Read Back By and Verified With: [email or web address]  BY MATTHEWS, B 9.7.21 [facility] GRAM STAIN REVIEWED-AGREE WITH RESULT    Culture   Final    CULTURE REINCUBATED FOR BETTER GROWTH Performed at [facility], [address] [place], [place] [address]    Report Status PENDING  Incomplete  SARS Coronavirus 2 by RT PCR (hospital order, performed in [facility] hospital lab) Nasopharyngeal Nasopharyngeal Swab     Status: None   Collection Time: 03/31/20  7:00  PM   Specimen: Nasopharyngeal Swab  Result Value Ref Range Status   SARS Coronavirus 2 NEGATIVE NEGATIVE Final    Comment: (NOTE) SARS-CoV-2 target nucleic acids are NOT DETECTED.  The SARS-CoV-2 RNA is generally detectable in upper and lower respiratory specimens during the acute phase of infection. The lowest concentration of SARS-CoV-2 viral copies this assay can detect is  250 copies / mL. A negative result does not preclude SARS-CoV-2 infection and should not be used as the sole basis for treatment or other patient management decisions.  A negative result may occur with improper specimen collection / handling, submission of specimen other than nasopharyngeal swab, presence of viral mutation(s) within the areas targeted by this assay, and inadequate number of viral copies (<250 copies / mL). A negative result must be combined with clinical observations, patient history, and epidemiological information.  Fact Sheet for Patients:   StrictlyIdeas.no  Fact Sheet for Healthcare Providers: BankingDealers.co.za  This test is not yet approved or  cleared by the [place] FDA and has been authorized for detection and/or diagnosis of SARS-CoV-2 by FDA under an Emergency Use Authorization (EUA).  This EUA will remain in effect (meaning this test can be used) for the duration of the COVID-19 declaration under Section 564(b)(1) of the Act, 21 U.S.C. section 360bbb-3(b)(1), unless the authorization is terminated or revoked sooner.  Performed at [facility], [address] [place], [place] [address]   Blood Culture ID Panel (Reflexed)     Status: None   Collection Time: 03/31/20  7:00 PM  Result Value Ref Range Status   Enterococcus faecalis NOT DETECTED NOT DETECTED Final   Enterococcus Faecium NOT DETECTED NOT DETECTED Final   Listeria monocytogenes NOT DETECTED NOT DETECTED Final   Staphylococcus species NOT DETECTED NOT DETECTED  Final   Staphylococcus aureus (BCID) NOT DETECTED NOT DETECTED Final   Staphylococcus epidermidis NOT DETECTED NOT DETECTED Final   Staphylococcus lugdunensis NOT DETECTED NOT DETECTED Final   Streptococcus species NOT DETECTED NOT DETECTED Final   Streptococcus agalactiae NOT DETECTED NOT DETECTED Final   Streptococcus pneumoniae NOT DETECTED NOT DETECTED Final   Streptococcus pyogenes NOT DETECTED NOT DETECTED Final   A.calcoaceticus-baumannii NOT DETECTED NOT DETECTED Final   Bacteroides fragilis NOT DETECTED NOT DETECTED Final   Enterobacterales NOT DETECTED NOT DETECTED Final   Enterobacter cloacae complex NOT DETECTED NOT DETECTED Final   Escherichia coli NOT DETECTED NOT DETECTED Final   Klebsiella aerogenes NOT DETECTED NOT DETECTED Final   Klebsiella oxytoca NOT DETECTED NOT DETECTED Final   Klebsiella pneumoniae NOT DETECTED NOT DETECTED Final   Proteus species NOT DETECTED NOT DETECTED Final   Salmonella species NOT DETECTED NOT DETECTED Final   Serratia marcescens NOT DETECTED NOT DETECTED Final   Haemophilus influenzae NOT DETECTED NOT DETECTED Final   Neisseria meningitidis NOT DETECTED NOT DETECTED Final   Pseudomonas aeruginosa NOT DETECTED NOT DETECTED Final   Stenotrophomonas maltophilia NOT DETECTED NOT DETECTED Final   Candida albicans NOT DETECTED NOT DETECTED Final   Candida auris NOT DETECTED NOT DETECTED Final   Candida glabrata NOT DETECTED NOT DETECTED Final   Candida krusei NOT DETECTED NOT DETECTED Final   Candida parapsilosis NOT DETECTED NOT DETECTED Final   Candida tropicalis NOT DETECTED NOT DETECTED Final   Cryptococcus neoformans/gattii NOT DETECTED NOT DETECTED Final    Comment: Performed at [facility], [address] [place], [place] [address]  Culture, blood (single)     Status: None (Preliminary result)   Collection Time: 03/31/20  8:55 PM   Specimen: BLOOD  Result Value Ref Range Status   Specimen Description BLOOD RIGHT ANTECUBITAL   Final   Special Requests   Final    BOTTLES DRAWN AEROBIC AND ANAEROBIC Blood Culture adequate volume   Culture   Final    NO GROWTH 2 DAYS Performed at [facility], [address] [place], [place] [address]    Report Status PENDING  Incomplete  Urine culture     Status: Abnormal   Collection Time: 04/01/20 12:44 AM   Specimen: In/Out Cath Urine  Result Value Ref Range Status   Specimen Description   Final    IN/OUT CATH URINE Performed at [facility], [address] [place], [place] [address]    Special Requests   Final    NONE Performed at [facility], [address], [place], [place] [address]    Culture (A)  Final    <10,000 COLONIES/mL INSIGNIFICANT GROWTH Performed at [facility] [address] [place], [place] [address]    Report Status 04/02/2020 FINAL  Final  MRSA PCR Screening     Status: None   Collection Time: 04/01/20  1:54 AM   Specimen: Nasopharyngeal  Result Value Ref Range Status   MRSA by PCR NEGATIVE NEGATIVE Final    Comment:        The GeneXpert MRSA Assay (FDA approved for NASAL specimens only), is one component of a comprehensive MRSA colonization surveillance program. It is not intended to diagnose MRSA infection nor to guide or monitor treatment for MRSA infections. Performed at [facility], [address], [place], [place] [address]   C Difficile Quick Screen w PCR reflex     Status: None   Collection Time: 04/02/20  1:14 PM   Specimen: Stool  Result Value Ref Range Status   C Diff antigen NEGATIVE NEGATIVE Final   C Diff toxin NEGATIVE NEGATIVE Final   C Diff interpretation No C. difficile detected.  Final    Comment: Performed at [facility], [address], [place],  [address]         Radiology Studies: No results found.      Scheduled Meds: . sodium chloride   Intravenous Once  . apixaban  2.5 mg Oral BID  . ARIPiprazole  2 mg Oral QHS  . carbidopa-levodopa  1 tablet Oral BID  . Chlorhexidine Gluconate  Cloth  6 each Topical Daily  . feeding supplement (ENSURE ENLIVE)  237 mL Oral TID BM  . loratadine  10 mg Oral Daily  . metoprolol tartrate  50 mg Oral BID  . OXcarbazepine  150 mg Oral BID  . potassium chloride  40 mEq Oral Once  . simvastatin  20 mg Oral QHS  . venlafaxine XR  150 mg Oral QHS   Continuous Infusions: . sodium chloride    . ceFEPime (MAXIPIME) IV    . metronidazole 500 mg (04/03/20 0317)     LOS: 2 days    Time spent: 35 minutes    Desirey Keahey D Manuella Ghazi, DO [facility]  If 7PM-7AM, please contact night-coverage [email or web address] 04/03/2020, 10:20 AM

## 2020-04-04 LAB — CBC WITH DIFFERENTIAL/PLATELET
Abs Immature Granulocytes: 0.18 10*3/uL — ABNORMAL HIGH (ref 0.00–0.07)
Basophils Absolute: 0 10*3/uL (ref 0.0–0.1)
Basophils Relative: 0 %
Eosinophils Absolute: 0.3 10*3/uL (ref 0.0–0.5)
Eosinophils Relative: 2 %
HCT: 28.3 % — ABNORMAL LOW (ref 39.0–52.0)
Hemoglobin: 9.1 g/dL — ABNORMAL LOW (ref 13.0–17.0)
Immature Granulocytes: 1 %
Lymphocytes Relative: 9 %
Lymphs Abs: 1.6 10*3/uL (ref 0.7–4.0)
MCH: 27.2 pg (ref 26.0–34.0)
MCHC: 32.2 g/dL (ref 30.0–36.0)
MCV: 84.5 fL (ref 80.0–100.0)
Monocytes Absolute: 1.2 10*3/uL — ABNORMAL HIGH (ref 0.1–1.0)
Monocytes Relative: 7 %
Neutro Abs: 15.3 10*3/uL — ABNORMAL HIGH (ref 1.7–7.7)
Neutrophils Relative %: 81 %
Platelets: 431 10*3/uL — ABNORMAL HIGH (ref 150–400)
RBC: 3.35 MIL/uL — ABNORMAL LOW (ref 4.22–5.81)
RDW: 16.8 % — ABNORMAL HIGH (ref 11.5–15.5)
WBC: 18.7 10*3/uL — ABNORMAL HIGH (ref 4.0–10.5)
nRBC: 0 % (ref 0.0–0.2)

## 2020-04-04 LAB — BASIC METABOLIC PANEL
Anion gap: 8 (ref 5–15)
BUN: 17 mg/dL (ref 8–23)
CO2: 19 mmol/L — ABNORMAL LOW (ref 22–32)
Calcium: 7.8 mg/dL — ABNORMAL LOW (ref 8.9–10.3)
Chloride: 104 mmol/L (ref 98–111)
Creatinine, Ser: 0.82 mg/dL (ref 0.61–1.24)
GFR calc Af Amer: >60 mL/min (ref >60)
GFR calc non Af Amer: >60 mL/min (ref >60)
Glucose, Bld: 74 mg/dL (ref 70–99)
Potassium: 4 mmol/L (ref 3.5–5.1)
Sodium: 131 mmol/L — ABNORMAL LOW (ref 135–145)

## 2020-04-04 LAB — OCCULT BLOOD X 1 CARD TO LAB, STOOL: Fecal Occult Bld: POSITIVE — AB

## 2020-04-04 LAB — OSMOLALITY, URINE: Osmolality, Ur: 486 mOsm/kg (ref 300–900)

## 2020-04-04 LAB — TYPE AND SCREEN
ABO/RH(D): AB POS
Antibody Screen: NEGATIVE
Unit division: 0
Unit division: 0

## 2020-04-04 LAB — BPAM RBC
Blood Product Expiration Date: 202109112359
Blood Product Expiration Date: 202109252359
ISSUE DATE / TIME: 202109091115
ISSUE DATE / TIME: 202109091532
Unit Type and Rh: 6200
Unit Type and Rh: 6200

## 2020-04-04 LAB — PROCALCITONIN: Procalcitonin: 10.69 ng/mL

## 2020-04-04 LAB — MAGNESIUM: Magnesium: 1.5 mg/dL — ABNORMAL LOW (ref 1.7–2.4)

## 2020-04-04 LAB — CULTURE, BLOOD (SINGLE): Special Requests: ADEQUATE

## 2020-04-04 LAB — SODIUM, URINE, RANDOM: Sodium, Ur: 149 mmol/L

## 2020-04-04 LAB — TSH: TSH: 4.096 u[IU]/mL (ref 0.350–4.500)

## 2020-04-04 LAB — OSMOLALITY: Osmolality: 278 mOsm/kg (ref 275–295)

## 2020-04-04 MED ORDER — MAGNESIUM SULFATE 2 GM/50ML IV SOLN
2.0000 g | Freq: Once | INTRAVENOUS | Status: AC
  Administered 2020-04-04: 2 g via INTRAVENOUS
  Filled 2020-04-04: qty 50

## 2020-04-04 MED ORDER — SODIUM CHLORIDE 0.9 % IV SOLN
3.0000 g | Freq: Four times a day (QID) | INTRAVENOUS | Status: AC
  Administered 2020-04-04 – 2020-04-10 (×23): 3 g via INTRAVENOUS
  Filled 2020-04-04 (×14): qty 8
  Filled 2020-04-04: qty 3
  Filled 2020-04-04 (×2): qty 8
  Filled 2020-04-04: qty 3
  Filled 2020-04-04 (×3): qty 8
  Filled 2020-04-04 (×2): qty 3
  Filled 2020-04-04 (×5): qty 8

## 2020-04-04 MED ORDER — TRAZODONE HCL 50 MG PO TABS
50.0000 mg | ORAL_TABLET | Freq: Once | ORAL | Status: AC
  Administered 2020-04-04: 50 mg via ORAL
  Filled 2020-04-04: qty 1

## 2020-04-04 NOTE — Progress Notes (Signed)
Pharmacy Antibiotic Note  Nathan [name] is a 74 y.o. male admitted on 03/31/2020 with sepsis, source unknown.  Pharmacy has been consulted for unasyn dosing. BCX + 1 bottle for  Fusobacterium.  Plan: Unasyn 3g IV q6h F/U cxs and clinical progress Monitor V/S, labs  Height: 5\' 11"  (180.3 cm) Weight: 86.4 kg (190 lb 7.6 oz) IBW/kg (Calculated) : 75.3  Temp (24hrs), Avg:98.1 F (36.7 C), Min:97.8 F (36.6 C), Max:98.8 F (37.1 C)  Recent Labs  Lab 03/31/20 1900 03/31/20 1900 03/31/20 2055 03/31/20 2336 04/01/20 0323 04/02/20 0531 04/03/20 0604 04/04/20 0417  WBC 42.8*  --   --   --  42.4* 25.4* 23.4* 18.7*  CREATININE 1.55*   < >  --  1.62* 1.71* 1.35* 0.97 0.82  LATICACIDVEN 8.0*  --  5.7* 4.1* 2.3*  --  0.6  --    < > = values in this interval not displayed.    Estimated Creatinine Clearance: 82.9 mL/min (by C-G formula based on SCr of 0.82 mg/dL).    Allergies  Allergen Reactions  . Terazosin Other (See Comments)    Syncope  . Terazosin Other (See Comments)    unknown    Antimicrobials this admission: unasyn 9/10>> 9/6 vancomycin >> 9/7 9/6 cefepime >> 9/10 9/6 metronidazole >>9/10  Microbiology results: 9/6 BCx X 1: Fusobacterium B-lactamase negative 9/6 UCX: insignificant growth 9/7 MRSA PCR negative 9/6 COVID: negative  Thank you for allowing pharmacy to be a part of this patient's care.  Isac Sarna, BS Pharm D, [place] Clinical Pharmacist Pager [phone] 04/04/2020 2:21 PM

## 2020-04-04 NOTE — Evaluation (Signed)
Physical Therapy Evaluation Patient Details Name: Nathan [name] MRN: [id number] DOB: 06/15/1946 Today's Date: 04/04/2020   History of Present Illness  Nathan [name] is a 74 y.o. male with medical history significant of DVT, GERD, hypertension, hyperlipidemia, PTSD, chronic renal insufficiency, history of seizures (last one was in 2004) who is coming to the emergency department due to progressively worse abdominal pain associated with constipation, decreased oral intake, fatigue, malaise and postural dizziness for the past 3 days.  He also had 2 episodes of nonbloody emesis in the past 24 hours.   He denies melena or hematochezia.  No dysuria, frequency or hematuria.  However, he states that he has been urinating less than usual.  He denies fever, chills, night sweats, rhinorrhea, sore throat, dyspnea, wheezing or hemoptysis.  No chest pain, palpitations, diaphoresis, PND, orthopnea or recent pitting edema of the lower extremities.  He denies polyuria, polydipsia, polyphagia or blurred vision.     Clinical Impression  Patient limited for functional mobility as stated below secondary to BLE weakness, fatigue and impaired standing balance. Patient performs slow, labored transition to seated EOB without physical assist. He demonstrates good sitting tolerance and balance while seated EOB. Patient denies dizziness with positional changes today. Patient transfers to standing with RW and verbal cueing for sequencing with RW. He demonstrates fair standing balance with RW and tolerates standing for several minutes with RW. He is limited by fatigue with ambulating in room and ends session seated in chair at bedside. Patient will benefit from continued physical therapy in hospital and recommended venue below to increase strength, balance, endurance for safe ADLs and gait.     Follow Up Recommendations SNF;Supervision/Assistance - 24 hour;Supervision for mobility/OOB    Equipment Recommendations  None  recommended by PT    Recommendations for Other Services       Precautions / Restrictions Precautions Precautions: Fall Restrictions Weight Bearing Restrictions: No      Mobility  Bed Mobility Overal bed mobility: Modified Independent             General bed mobility comments: slow, labored transition to seated EOB with HOB elevated  Transfers Overall transfer level: Needs assistance Equipment used: Rolling walker (2 wheeled) Transfers: Sit to/from [facility] Sit to Stand: Min assist Stand pivot transfers: Min assist       General transfer comment: min assist for transfer to standing with RW, verbal cueing for sequencing  Ambulation/Gait Ambulation/Gait assistance: Min assist Gait Distance (Feet): 8 Feet Assistive device: Rolling walker (2 wheeled) Gait Pattern/deviations: Decreased step length - right;Decreased step length - left;Decreased stride length Gait velocity: decreased   General Gait Details: slow, labored cadence with RW, limited by fatigue  Stairs            Wheelchair Mobility    Modified Rankin (Stroke Patients Only)       Balance Overall balance assessment: Needs assistance Sitting-balance support: No upper extremity supported;Feet supported Sitting balance-Leahy Scale: Good Sitting balance - Comments: seated EOB   Standing balance support: Bilateral upper extremity supported Standing balance-Leahy Scale: Fair Standing balance comment: fair/poor with RW                             Pertinent Vitals/Pain Pain Assessment: 0-10 Pain Score: 2  Pain Location: stomach Pain Intervention(s): Limited activity within patient's tolerance;Monitored during session;Repositioned    Home Living Family/patient expects to be discharged to:: Private residence Living Arrangements: Spouse/significant other Available  Help at Discharge: Family;Available 24 hours/day Type of Home: House Home Access: Stairs to  enter Entrance Stairs-Rails: Right Entrance Stairs-Number of Steps: 5 Home Layout: Two level;Able to live on main level with bedroom/bathroom Home Equipment: Kasandra Knudsen - single point;Shower seat;Walker - 2 wheels;Bedside commode;Wheelchair - manual      Prior Function Level of Independence: Needs assistance   Gait / Transfers Assistance Needed: Patient states household ambulator with RW  ADL's / Homemaking Assistance Needed: Wife assists        Hand Dominance   Dominant Hand: Right    Extremity/Trunk Assessment   Upper Extremity Assessment Upper Extremity Assessment: Generalized weakness    Lower Extremity Assessment Lower Extremity Assessment: Generalized weakness    Cervical / Trunk Assessment Cervical / Trunk Assessment: Normal  Communication   Communication: No difficulties  Cognition Arousal/Alertness: Awake/alert Behavior During Therapy: WFL for tasks assessed/performed Overall Cognitive Status: Within Functional Limits for tasks assessed                                        General Comments      Exercises     Assessment/Plan    PT Assessment Patient needs continued PT services  PT Problem List Decreased strength;Decreased activity tolerance;Decreased balance;Decreased mobility;Decreased knowledge of use of DME       PT Treatment Interventions DME instruction;Therapeutic exercise;Gait training;Balance training;Stair training;Neuromuscular re-education;Functional mobility training;Therapeutic activities;Patient/family education    PT Goals (Current goals can be found in the Care Plan section)  Acute Rehab PT Goals Patient Stated Goal: Return home PT Goal Formulation: With patient Time For Goal Achievement: 04/18/20 Potential to Achieve Goals: Fair    Frequency Min 3X/week   Barriers to discharge        Co-evaluation               AM-PAC PT "6 Clicks" Mobility  Outcome Measure Help needed turning from your back to your  side while in a flat bed without using bedrails?: None Help needed moving from lying on your back to sitting on the side of a flat bed without using bedrails?: A Little Help needed moving to and from a bed to a chair (including a wheelchair)?: A Lot Help needed standing up from a chair using your arms (e.g., wheelchair or bedside chair)?: A Little Help needed to walk in hospital room?: A Lot Help needed climbing 3-5 steps with a railing? : Total 6 Click Score: 15    End of Session Equipment Utilized During Treatment: Gait belt Activity Tolerance: Patient tolerated treatment well;Patient limited by fatigue Patient left: in chair;with call bell/phone within reach;with chair alarm set Nurse Communication: Mobility status PT Visit Diagnosis: Unsteadiness on feet (R26.81);Other abnormalities of gait and mobility (R26.89);Muscle weakness (generalized) (M62.81)    Time: 1884-1660 PT Time Calculation (min) (ACUTE ONLY): 26 min   Charges:   PT Evaluation $PT Eval Moderate Complexity: 1 Mod PT Treatments $Therapeutic Activity: 8-22 mins        10:46 AM, 04/04/20 Mearl Latin PT, DPT Physical Therapist at [facility]

## 2020-04-04 NOTE — Progress Notes (Signed)
PROGRESS NOTE    Nathan [name]  JAS:[id number] DOB: Dec 01, 1945 DOA: 03/31/2020 PCP: Patient, No Pcp Per   Brief Narrative:  Per HPI: Nathan [name] a 74 y.o.malewith medical history significant ofDVT, GERD, hypertension, hyperlipidemia, PTSD, chronic renal insufficiency, history of seizures (last one was in 2004) who is coming to the emergency department due toprogressively worse abdominal pain associated with constipation, decreased oral intake, fatigue, malaise and postural dizziness for the past 3 days. He also had 2 episodes of nonbloody emesis in the past 24 hours. He denies melena or hematochezia. No dysuria, frequency or hematuria. However, he states that he has been urinating less than usual. He denies fever, chills, night sweats, rhinorrhea, sore throat, dyspnea, wheezing or hemoptysis. No chest pain, palpitations, diaphoresis, PND, orthopnea or recent pitting edema of the lower extremities. He denies polyuria, polydipsia, polyphagia or blurred vision.  9/8:Patient seen and evaluated this morning. Multiple loose, liquidy stools noted overnight. Plan to check C. difficile and GI panel. Leukocytosis improving on current antibiotics as well as other lab work. Hematology consultation requested.  9/9: Patient continues have ongoing diarrhea, but C. difficile testing negative.  GI panel pending.  Noted to have worsening anemia with no overt bleeding noted.  Stool occult ordered and pending.  2 unit PRBC transfusion planned.  Hematology consultation on 9/8 with likely leukemoid reaction noted. Hold Eliquis.  9/10: Patient's hemoglobin levels have improved to 9.1 this morning after PRBC transfusion.  He will require some further magnesium supplementation.  GI pathogen panel negative.  Stool occult pending.  His diarrhea has improved, but procalcitonin is still quite elevated.  Continue current antibiotics.  Monitor repeat CBC in a.m.  Assessment & Plan:   Principal  Problem:   Sepsis due to undetermined organism [facility]) Active Problems:   Seizure ([place])   HTN (hypertension)   GERD (gastroesophageal reflux disease)   Hyponatremia   Lactic acidosis   AKI (acute kidney injury) ([place])   Normocytic anemia   Thrombocytosis ([facility])   Hyperlipidemia   Hyperglycemia   Sepsis on admission of unknown etiology -Patient now with diarrhea and C. difficile testing negative.  GI panel negative -Procalcitonin elevated at 10.69 -Blood cultures thus far negative -Urine cultures with insignificant growth -Continue current antibiotics  Acute anemia-improved status post 2 unit PRBC transfusion on 9/9 -Stool occult still pending -Iron deficiency noted on anemia panel, but will hold off on iron transfusion given acute infection  Ongoing diarrhea-resolved -Cdiff negative -GI panel negative -Imodium prn -Advance diet today  Lactic acidosis secondary to above-resolved  AKI-resolved -Likely secondary to sepsis physiology -Creatinine at baseline near 0.9 and currently 0.82 -Continue to monitor -Hold further IV fluid since diarrhea has resolved  Hyponatremia -Likely related to GI losses -Continue normal saline as ordered -Check serum and urine osmolarity as well as urine sodium and TSH to fully evaluate -Recheck labs in a.m.  Seizure history -Continue oxycarbamazepine -Continue Sinemet  Hypertension -Hold lisinopril and start metoprolol -Labetalol prn  GERD -PPI  Likely leukemoid reaction -Appreciate hematology evaluation -Continue current antibiotics as ordered  Dyslipidemia -Resume home statin  History of prior DVT -Hold Eliquis for now with worsening anemia noted -Plan to resume if this remains stable by tomorrow and stool occult negative   DVT prophylaxis:Eliquis changed to SCDs Code Status:Full Family Communication:Discussed withwife 9/8, 9/9, 9/10 Disposition Plan: Status is: Inpatient  Remains inpatient appropriate  because:IV treatments appropriate due to intensity of illness or inability to take PO and Inpatient level of care appropriate due  to severity of illness   Dispo: The patient is from:Home Anticipated d/c is BS:[facility] Anticipated d/c date is: 1-2 days Patient currently is not medically stable to d/c.He is requiring PRBC transfusion 9/9 and is having ongoing diarrhea.  Consultants:  Heme/Onc  Procedures:  See below  Antimicrobials:  Anti-infectives (From admission, onward)   Start     Dose/Rate Route Frequency Ordered Stop   04/03/20 1000  ceFEPIme (MAXIPIME) 2 g in sodium chloride 0.9 % 100 mL IVPB        2 g 200 mL/hr over 30 Minutes Intravenous Every 8 hours 04/03/20 0909     04/01/20 0830  vancomycin (VANCOREADY) IVPB 750 mg/150 mL  Status:  Discontinued        750 mg 150 mL/hr over 60 Minutes Intravenous Every 12 hours 03/31/20 2024 04/01/20 1004   04/01/20 0400  metroNIDAZOLE (FLAGYL) IVPB 500 mg        500 mg 100 mL/hr over 60 Minutes Intravenous Every 8 hours 04/01/20 0026     03/31/20 2030  ceFEPIme (MAXIPIME) 2 g in sodium chloride 0.9 % 100 mL IVPB  Status:  Discontinued        2 g 200 mL/hr over 30 Minutes Intravenous Every 12 hours 03/31/20 2024 04/03/20 0909   03/31/20 2015  vancomycin (VANCOREADY) IVPB 2000 mg/400 mL        2,000 mg 200 mL/hr over 120 Minutes Intravenous  Once 03/31/20 2002 04/01/20 0027   03/31/20 2000  ceFEPIme (MAXIPIME) 2 g in sodium chloride 0.9 % 100 mL IVPB  Status:  Discontinued        2 g 200 mL/hr over 30 Minutes Intravenous  Once 03/31/20 1954 03/31/20 2024   03/31/20 2000  metroNIDAZOLE (FLAGYL) IVPB 500 mg        500 mg 100 mL/hr over 60 Minutes Intravenous  Once 03/31/20 1954 03/31/20 2159   03/31/20 2000  vancomycin (VANCOCIN) IVPB 1000 mg/200 mL premix  Status:  Discontinued        1,000 mg 200 mL/hr over 60 Minutes Intravenous  Once 03/31/20 1954 03/31/20 2002       Subjective: Patient seen and evaluated today with no new acute complaints or concerns. No acute concerns or events noted overnight.  He would like to have something to eat as he is more hungry and he states that his diarrhea has resolved since yesterday.  No overt bleeding noted.  Objective: Vitals:   04/03/20 1555 04/03/20 1900 04/03/20 2020 04/04/20 0434  BP: (!) 146/73 135/84 (!) 144/92 (!) 155/80  Pulse: 83 61 64 83  Resp: 16 16 20 20   Temp: 98 F (36.7 C) 98.2 F (36.8 C) 98.8 F (37.1 C) 98.3 F (36.8 C)  TempSrc: Oral Oral Oral Oral  SpO2: 100% 100% 98% 96%  Weight:      Height:        Intake/Output Summary (Last 24 hours) at 04/04/2020 1035 Last data filed at 04/04/2020 0436 Gross per 24 hour  Intake 3638.4 ml  Output 600 ml  Net 3038.4 ml   Filed Weights   04/01/20 0206 04/02/20 0500 04/02/20 1930  Weight: 86 kg 86 kg 86.4 kg    Examination:  General exam: Appears calm and comfortable  Respiratory system: Clear to auscultation. Respiratory effort normal. Cardiovascular system: S1 & S2 heard, RRR. Gastrointestinal system: Abdomen is nondistended, soft and nontender. Central nervous system: Alert and oriented. No focal neurological deficits. Extremities: Symmetric 5 x 5 power. Skin: No rashes, lesions or  ulcers Psychiatry: Judgement and insight appear normal. Mood & affect appropriate.     Data Reviewed: I have personally reviewed following labs and imaging studies  CBC: Recent Labs  Lab 03/31/20 1900 03/31/20 1900 04/01/20 0323 04/02/20 0531 04/03/20 0604 04/03/20 2122 04/04/20 0417  WBC 42.8*  --  42.4* 25.4* 23.4*  --  18.7*  NEUTROABS 39.1*  --  38.2* 23.1* 20.2*  --  15.3*  HGB 10.0*   < > 8.8* 7.5* 6.9* 9.5* 9.1*  HCT 31.9*   < > 27.6* 23.5* 21.6* 29.5* 28.3*  MCV 84.6  --  84.9 83.0 83.7  --  84.5  PLT 783*  --  577* 506* 442*  --  431*   < > = values in this interval not displayed.   Basic Metabolic Panel: Recent Labs  Lab  03/31/20 2336 04/01/20 0323 04/02/20 0531 04/03/20 0604 04/04/20 0417  NA 128* 129* 132* 131* 131*  K 4.5 5.0 4.1 3.5 4.0  CL 98 99 104 104 104  CO2 16* 17* 17* 18* 19*  GLUCOSE 109* 122* 104* 98 74  BUN 24* 27* 28* 21 17  CREATININE 1.62* 1.71* 1.35* 0.97 0.82  CALCIUM 8.0* 7.8* 7.9* 7.9* 7.8*  MG  --   --   --  1.7 1.5*   GFR: Estimated Creatinine Clearance: 82.9 mL/min (by C-G formula based on SCr of 0.82 mg/dL). Liver Function Tests: Recent Labs  Lab 03/31/20 1900 04/01/20 0323 04/02/20 0531 04/03/20 0604  AST 54* 39 21 20  ALT 15 11 15 11   ALKPHOS 87 70 60 55  BILITOT 1.0 1.3* 0.8 0.5  PROT 7.6 6.2* 5.8* 5.5*  ALBUMIN 2.8* 2.2* 2.0* 1.9*   Recent Labs  Lab 03/31/20 1900  LIPASE 41   No results for input(s): AMMONIA in the last 168 hours. Coagulation Profile: Recent Labs  Lab 03/31/20 1900  INR 1.8*   Cardiac Enzymes: No results for input(s): CKTOTAL, CKMB, CKMBINDEX, TROPONINI in the last 168 hours. BNP (last 3 results) No results for input(s): PROBNP in the last 8760 hours. HbA1C: No results for input(s): HGBA1C in the last 72 hours. CBG: No results for input(s): GLUCAP in the last 168 hours. Lipid Profile: No results for input(s): CHOL, HDL, LDLCALC, TRIG, CHOLHDL, LDLDIRECT in the last 72 hours. Thyroid Function Tests: No results for input(s): TSH, T4TOTAL, FREET4, T3FREE, THYROIDAB in the last 72 hours. Anemia Panel: Recent Labs    04/03/20 0841  VITAMINB12 470  FOLATE 10.4  FERRITIN 147  TIBC 152*  IRON 19*  RETICCTPCT 2.1   Sepsis Labs: Recent Labs  Lab 03/31/20 2055 03/31/20 2336 04/01/20 0323 04/03/20 0604 04/04/20 0417  PROCALCITON  --   --   --   --  10.69  LATICACIDVEN 5.7* 4.1* 2.3* 0.6  --     Recent Results (from the past 240 hour(s))  Blood culture (routine single)     Status: Abnormal   Collection Time: 03/31/20  7:00 PM   Specimen: Left Antecubital; Blood  Result Value Ref Range Status   Specimen Description    Final    LEFT ANTECUBITAL Performed at [facility], [address] [place], [place] [address]    Special Requests   Final    BOTTLES DRAWN AEROBIC AND ANAEROBIC Blood Culture adequate volume Performed at [facility], [address], [place], [place] [address]    Culture  Setup Time   Final    GRAM NEGATIVE RODS ANAEROBIC BOTTLE Gram Stain Report Called to,Read Back  By and Verified With: MURPHY,[email or web address]  BY MATTHEWS, B 9.7.21 [facility] GRAM STAIN REVIEWED-AGREE WITH RESULT    Culture (A)  Final    FUSOBACTERIUM NUCLEATUM BETA LACTAMASE NEGATIVE Performed at [facility], [place] [address] [place], [place] [address]    Report Status 04/04/2020 FINAL  Final  SARS Coronavirus 2 by RT PCR (hospital order, performed in [facility] hospital lab) Nasopharyngeal Nasopharyngeal Swab     Status: None   Collection Time: 03/31/20  7:00 PM   Specimen: Nasopharyngeal Swab  Result Value Ref Range Status   SARS Coronavirus 2 NEGATIVE NEGATIVE Final    Comment: (NOTE) SARS-CoV-2 target nucleic acids are NOT DETECTED.  The SARS-CoV-2 RNA is generally detectable in upper and lower respiratory specimens during the acute phase of infection. The lowest concentration of SARS-CoV-2 viral copies this assay can detect is 250 copies / mL. A negative result does not preclude SARS-CoV-2 infection and should not be used as the sole basis for treatment or other patient management decisions.  A negative result may occur with improper specimen collection / handling, submission of specimen other than nasopharyngeal swab, presence of viral mutation(s) within the areas targeted by this assay, and inadequate number of viral copies (<250 copies / mL). A negative result must be combined with clinical observations, patient history, and epidemiological information.  Fact Sheet for Patients:   StrictlyIdeas.no  Fact Sheet for Healthcare  Providers: BankingDealers.co.za  This test is not yet approved or  cleared by the [place] FDA and has been authorized for detection and/or diagnosis of SARS-CoV-2 by FDA under an Emergency Use Authorization (EUA).  This EUA will remain in effect (meaning this test can be used) for the duration of the COVID-19 declaration under Section 564(b)(1) of the Act, 21 U.S.C. section 360bbb-3(b)(1), unless the authorization is terminated or revoked sooner.  Performed at [facility], [address], [place], [place] [address]   Blood Culture ID Panel (Reflexed)     Status: None   Collection Time: 03/31/20  7:00 PM  Result Value Ref Range Status   Enterococcus faecalis NOT DETECTED NOT DETECTED Final   Enterococcus Faecium NOT DETECTED NOT DETECTED Final   Listeria monocytogenes NOT DETECTED NOT DETECTED Final   Staphylococcus species NOT DETECTED NOT DETECTED Final   Staphylococcus aureus (BCID) NOT DETECTED NOT DETECTED Final   Staphylococcus epidermidis NOT DETECTED NOT DETECTED Final   Staphylococcus lugdunensis NOT DETECTED NOT DETECTED Final   Streptococcus species NOT DETECTED NOT DETECTED Final   Streptococcus agalactiae NOT DETECTED NOT DETECTED Final   Streptococcus pneumoniae NOT DETECTED NOT DETECTED Final   Streptococcus pyogenes NOT DETECTED NOT DETECTED Final   A.calcoaceticus-baumannii NOT DETECTED NOT DETECTED Final   Bacteroides fragilis NOT DETECTED NOT DETECTED Final   Enterobacterales NOT DETECTED NOT DETECTED Final   Enterobacter cloacae complex NOT DETECTED NOT DETECTED Final   Escherichia coli NOT DETECTED NOT DETECTED Final   Klebsiella aerogenes NOT DETECTED NOT DETECTED Final   Klebsiella oxytoca NOT DETECTED NOT DETECTED Final   Klebsiella pneumoniae NOT DETECTED NOT DETECTED Final   Proteus species NOT DETECTED NOT DETECTED Final   Salmonella species NOT DETECTED NOT DETECTED Final   Serratia marcescens NOT DETECTED NOT DETECTED Final    Haemophilus influenzae NOT DETECTED NOT DETECTED Final   Neisseria meningitidis NOT DETECTED NOT DETECTED Final   Pseudomonas aeruginosa NOT DETECTED NOT DETECTED Final   Stenotrophomonas maltophilia NOT DETECTED NOT DETECTED Final   Candida albicans NOT DETECTED NOT DETECTED Final  Candida auris NOT DETECTED NOT DETECTED Final   Candida glabrata NOT DETECTED NOT DETECTED Final   Candida krusei NOT DETECTED NOT DETECTED Final   Candida parapsilosis NOT DETECTED NOT DETECTED Final   Candida tropicalis NOT DETECTED NOT DETECTED Final   Cryptococcus neoformans/gattii NOT DETECTED NOT DETECTED Final    Comment: Performed at [facility], [place] [address] [place], [place] [address]  Culture, blood (single)     Status: None (Preliminary result)   Collection Time: 03/31/20  8:55 PM   Specimen: BLOOD  Result Value Ref Range Status   Specimen Description BLOOD RIGHT ANTECUBITAL  Final   Special Requests   Final    BOTTLES DRAWN AEROBIC AND ANAEROBIC Blood Culture adequate volume   Culture   Final    NO GROWTH 3 DAYS Performed at [facility], [address] [place], [place] [address]    Report Status PENDING  Incomplete  Urine culture     Status: Abnormal   Collection Time: 04/01/20 12:44 AM   Specimen: In/Out Cath Urine  Result Value Ref Range Status   Specimen Description   Final    IN/OUT CATH URINE Performed at [facility], [address], [place], [place] [address]    Special Requests   Final    NONE Performed at [facility], [address] [place], [place] [address]    Culture (A)  Final    <10,000 COLONIES/mL INSIGNIFICANT GROWTH Performed at [place] [address] [place], [place] [address]    Report Status 04/02/2020 FINAL  Final  MRSA PCR Screening     Status: None   Collection Time: 04/01/20  1:54 AM   Specimen: Nasopharyngeal  Result Value Ref Range Status   MRSA by PCR NEGATIVE NEGATIVE Final    Comment:        The GeneXpert MRSA Assay  (FDA approved for NASAL specimens only), is one component of a comprehensive MRSA colonization surveillance program. It is not intended to diagnose MRSA infection nor to guide or monitor treatment for MRSA infections. Performed at [facility], [address], [place], [place] [address]   C Difficile Quick Screen w PCR reflex     Status: None   Collection Time: 04/02/20  1:14 PM   Specimen: Stool  Result Value Ref Range Status   C Diff antigen NEGATIVE NEGATIVE Final   C Diff toxin NEGATIVE NEGATIVE Final   C Diff interpretation No C. difficile detected.  Final    Comment: Performed at [facility], [address] [place], [place] [address]  Gastrointestinal Panel by PCR , Stool     Status: None   Collection Time: 04/02/20  1:14 PM   Specimen: Stool  Result Value Ref Range Status   Campylobacter species NOT DETECTED NOT DETECTED Final   Plesimonas shigelloides NOT DETECTED NOT DETECTED Final   Salmonella species NOT DETECTED NOT DETECTED Final   Yersinia enterocolitica NOT DETECTED NOT DETECTED Final   Vibrio species NOT DETECTED NOT DETECTED Final   Vibrio cholerae NOT DETECTED NOT DETECTED Final   Enteroaggregative E coli (EAEC) NOT DETECTED NOT DETECTED Final   Enteropathogenic E coli (EPEC) NOT DETECTED NOT DETECTED Final   Enterotoxigenic E coli (ETEC) NOT DETECTED NOT DETECTED Final   Shiga like toxin producing E coli (STEC) NOT DETECTED NOT DETECTED Final   Shigella/Enteroinvasive E coli (EIEC) NOT DETECTED NOT DETECTED Final   Cryptosporidium NOT DETECTED NOT DETECTED Final   Cyclospora cayetanensis NOT DETECTED NOT DETECTED  Final   Entamoeba histolytica NOT DETECTED NOT DETECTED Final   Giardia lamblia NOT DETECTED NOT DETECTED Final   Adenovirus F40/41 NOT DETECTED NOT DETECTED Final   Astrovirus NOT DETECTED NOT DETECTED Final   Norovirus GI/GII NOT DETECTED NOT DETECTED Final   Rotavirus A NOT DETECTED NOT DETECTED Final   Sapovirus (I, II, IV, and V) NOT DETECTED  NOT DETECTED Final    Comment: Performed at [facility], [address], [place], [place] [address]         Radiology Studies: No results found.      Scheduled Meds: . sodium chloride   Intravenous Once  . ARIPiprazole  2 mg Oral QHS  . carbidopa-levodopa  1 tablet Oral BID  . Chlorhexidine Gluconate Cloth  6 each Topical Daily  . feeding supplement (ENSURE ENLIVE)  237 mL Oral TID BM  . loratadine  10 mg Oral Daily  . metoprolol tartrate  50 mg Oral BID  . OXcarbazepine  150 mg Oral BID  . pantoprazole (PROTONIX) IV  40 mg Intravenous Q24H  . simvastatin  20 mg Oral QHS  . venlafaxine XR  150 mg Oral QHS   Continuous Infusions: . ceFEPime (MAXIPIME) IV 2 g (04/04/20 0953)  . metronidazole 500 mg (04/04/20 0435)     LOS: 3 days    Time spent: 35 minutes    Amel Gianino D Manuella Ghazi, DO [facility]  If 7PM-7AM, please contact night-coverage [email or web address] 04/04/2020, 10:35 AM

## 2020-04-04 NOTE — Plan of Care (Signed)
  Problem: Acute Rehab PT Goals(only PT should resolve) Goal: Patient Will Transfer Sit To/From Stand Outcome: Progressing Flowsheets (Taken 04/04/2020 1047) Patient will transfer sit to/from stand: with supervision Goal: Pt Will Transfer Bed To Chair/Chair To Bed Outcome: Progressing Flowsheets (Taken 04/04/2020 1047) Pt will Transfer Bed to Chair/Chair to Bed: with supervision Goal: Pt Will Ambulate Outcome: Progressing Flowsheets (Taken 04/04/2020 1047) Pt will Ambulate:  25 feet  with rolling walker  with min guard assist Goal: Pt/caregiver will Perform Home Exercise Program Outcome: Progressing Flowsheets (Taken 04/04/2020 1047) Pt/caregiver will Perform Home Exercise Program:  For increased strengthening  For improved balance  Independently  10:48 AM, 04/04/20 Mearl Latin PT, DPT Physical Therapist at [facility]

## 2020-04-05 LAB — CULTURE, BLOOD (SINGLE)
Culture: NO GROWTH
Special Requests: ADEQUATE

## 2020-04-05 LAB — CBC WITH DIFFERENTIAL/PLATELET
Abs Immature Granulocytes: 0.42 10*3/uL — ABNORMAL HIGH (ref 0.00–0.07)
Basophils Absolute: 0.1 10*3/uL (ref 0.0–0.1)
Basophils Relative: 0 %
Eosinophils Absolute: 0.4 10*3/uL (ref 0.0–0.5)
Eosinophils Relative: 2 %
HCT: 31.2 % — ABNORMAL LOW (ref 39.0–52.0)
Hemoglobin: 10.1 g/dL — ABNORMAL LOW (ref 13.0–17.0)
Immature Granulocytes: 2 %
Lymphocytes Relative: 7 %
Lymphs Abs: 1.3 10*3/uL (ref 0.7–4.0)
MCH: 27.4 pg (ref 26.0–34.0)
MCHC: 32.4 g/dL (ref 30.0–36.0)
MCV: 84.8 fL (ref 80.0–100.0)
Monocytes Absolute: 1.2 10*3/uL — ABNORMAL HIGH (ref 0.1–1.0)
Monocytes Relative: 6 %
Neutro Abs: 15.9 10*3/uL — ABNORMAL HIGH (ref 1.7–7.7)
Neutrophils Relative %: 83 %
Platelets: 456 10*3/uL — ABNORMAL HIGH (ref 150–400)
RBC: 3.68 MIL/uL — ABNORMAL LOW (ref 4.22–5.81)
RDW: 17 % — ABNORMAL HIGH (ref 11.5–15.5)
WBC: 19.2 10*3/uL — ABNORMAL HIGH (ref 4.0–10.5)
nRBC: 0 % (ref 0.0–0.2)

## 2020-04-05 LAB — BASIC METABOLIC PANEL
Anion gap: 10 (ref 5–15)
BUN: 13 mg/dL (ref 8–23)
CO2: 23 mmol/L (ref 22–32)
Calcium: 8.2 mg/dL — ABNORMAL LOW (ref 8.9–10.3)
Chloride: 101 mmol/L (ref 98–111)
Creatinine, Ser: 0.87 mg/dL (ref 0.61–1.24)
GFR calc Af Amer: >60 mL/min (ref >60)
GFR calc non Af Amer: >60 mL/min (ref >60)
Glucose, Bld: 94 mg/dL (ref 70–99)
Potassium: 4.3 mmol/L (ref 3.5–5.1)
Sodium: 134 mmol/L — ABNORMAL LOW (ref 135–145)

## 2020-04-05 LAB — MAGNESIUM: Magnesium: 1.5 mg/dL — ABNORMAL LOW (ref 1.7–2.4)

## 2020-04-05 MED ORDER — ALUM & MAG HYDROXIDE-SIMETH 200-200-20 MG/5ML PO SUSP
30.0000 mL | ORAL | Status: DC | PRN
  Administered 2020-04-05: 30 mL via ORAL
  Filled 2020-04-05: qty 30

## 2020-04-05 MED ORDER — MAGNESIUM SULFATE 2 GM/50ML IV SOLN
2.0000 g | Freq: Once | INTRAVENOUS | Status: AC
  Administered 2020-04-05: 2 g via INTRAVENOUS
  Filled 2020-04-05: qty 50

## 2020-04-05 NOTE — Progress Notes (Addendum)
PROGRESS NOTE    Nathan [name]  BZJ:[id number] DOB: [birth year] DOA: 03/31/2020 PCP: Patient, No Pcp Per   Brief Narrative:  Per HPI: Nathan [name] a 74 y.o.malewith medical history significant ofDVT, GERD, hypertension, hyperlipidemia, PTSD, chronic renal insufficiency, history of seizures (last one was in 2004) who is coming to the emergency department due toprogressively worse abdominal pain associated with constipation, decreased oral intake, fatigue, malaise and postural dizziness for the past 3 days. He also had 2 episodes of nonbloody emesis in the past 24 hours. He denies melena or hematochezia. No dysuria, frequency or hematuria. However, he states that he has been urinating less than usual. He denies fever, chills, night sweats, rhinorrhea, sore throat, dyspnea, wheezing or hemoptysis. No chest pain, palpitations, diaphoresis, PND, orthopnea or recent pitting edema of the lower extremities. He denies polyuria, polydipsia, polyphagia or blurred vision.  9/8:Patient seen and evaluated this morning. Multiple loose, liquidy stools noted overnight. Plan to check C. difficile and GI panel. Leukocytosis improving on current antibiotics as well as other lab work. Hematology consultation requested.  9/9:Patient continues have ongoing diarrhea, but C. difficile testing negative. GI panel pending. Noted to have worsening anemia with no overt bleeding noted. Stool occult ordered and pending. 2 unit PRBC transfusion planned. Hematology consultation on 9/8 with likely leukemoid reaction noted. Hold Eliquis.  9/10: Patient's hemoglobin levels have improved to 9.1 this morning after PRBC transfusion.  He will require some further magnesium supplementation.  GI pathogen panel negative.  Stool occult pending.  His diarrhea has improved, but procalcitonin is still quite elevated.  Continue current antibiotics.  Monitor repeat CBC in a.m.  9/11: Patient noted to have isolated  Fusobacterium in 1 set of blood culture and has been switched to Unasyn for coverage.  Hemoglobin levels continue to remain stable, but he is Hemoccult positive and will require GI evaluation eventually.  Continue ongoing antibiotics as planned.  Monitor CBC.  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 difficile testing negative. GI panel negative -Procalcitonin elevated at 10.69 -Urine cultures with insignificant growth -Continue current antibiotics, now with Unasyn  Acute anemia-improved status post 2 unit PRBC transfusion on 9/9 -Stool occult positive, plan for GI consultation by 9/13 -Iron deficiency noted on anemia panel, but will hold off on iron transfusion given acute infection  Ongoing diarrhea-intermittent -Cdiff negative -GI panel negative -Imodium prn  Lactic acidosis secondary to above-resolved  AKI-resolved -Likely secondary to sepsis physiology -Creatinine at baseline near 0.9 and currently0.82 -Continue to monitor -Hold further IV fluid since diarrhea has resolved  Hyponatremia-improved -Likely related to GI losses -Check serum and urine osmolarity as well as urine sodium  -TSH 4.0 -Recheck labs in a.m.  Seizure history -Continue oxycarbamazepine -Continue Sinemet  Hypertension -Hold lisinopril and start metoprolol -Labetalol prn  GERD -PPI  Likelyleukemoid reaction -Appreciate hematology evaluation -Continue current antibiotics as ordered  Dyslipidemia -Resume home statin  History of prior DVT -Hold Eliquis for now with worsening anemia noted -Plan to resume if  this remains stable by tomorrow and stool occult negative   DVT prophylaxis:Eliquischanged to SCDs Code Status:Full Family Communication:Discussed withwife 9/8,  9/9, 9/10, 9/11 Disposition Plan: Status is: Inpatient  Remains inpatient appropriate because:IV treatments appropriate due to intensity of illness or inability to take PO and Inpatient level of care appropriate due to severity of illness   Dispo: The patient is from:Home Anticipated d/c is AT:[facility] Anticipated d/c date is: 1-2days Patient currently is not medically stable to d/c.He is requiring PRBC transfusion 9/9 and is having ongoing diarrhea.  Consultants:  Heme/Onc  Procedures:  See below  Antimicrobials:  Anti-infectives (From admission, onward)   Start     Dose/Rate Route Frequency Ordered Stop   04/04/20 1800  Ampicillin-Sulbactam (UNASYN) 3 g in sodium chloride 0.9 % 100 mL IVPB        3 g 200 mL/hr over 30 Minutes Intravenous Every 6 hours 04/04/20 1420     04/03/20 1000  ceFEPIme (MAXIPIME) 2 g in sodium chloride 0.9 % 100 mL IVPB  Status:  Discontinued        2 g 200 mL/hr over 30 Minutes Intravenous Every 8 hours 04/03/20 0909 04/04/20 1420   04/01/20 0830  vancomycin (VANCOREADY) IVPB 750 mg/150 mL  Status:  Discontinued        750 mg 150 mL/hr over 60 Minutes Intravenous Every 12 hours 03/31/20 2024 04/01/20 1004   04/01/20 0400  metroNIDAZOLE (FLAGYL) IVPB 500 mg  Status:  Discontinued        500 mg 100 mL/hr over 60 Minutes Intravenous Every 8 hours 04/01/20 0026 04/04/20 1420   03/31/20 2030  ceFEPIme (MAXIPIME) 2 g in sodium chloride 0.9 % 100 mL IVPB  Status:  Discontinued        2 g 200 mL/hr over 30 Minutes Intravenous Every 12 hours 03/31/20 2024 04/03/20 0909   03/31/20 2015  vancomycin (VANCOREADY) IVPB 2000 mg/400 mL        2,000 mg 200 mL/hr over 120 Minutes Intravenous  Once 03/31/20 2002 04/01/20 0027   03/31/20 2000  ceFEPIme  (MAXIPIME) 2 g in sodium chloride 0.9 % 100 mL IVPB  Status:  Discontinued        2 g 200 mL/hr over 30 Minutes Intravenous  Once 03/31/20 1954 03/31/20 2024   03/31/20 2000  metroNIDAZOLE (FLAGYL) IVPB 500 mg        500 mg 100 mL/hr over 60 Minutes Intravenous  Once 03/31/20 1954 03/31/20 2159   03/31/20 2000  vancomycin (VANCOCIN) IVPB 1000 mg/200 mL premix  Status:  Discontinued        1,000 mg 200 mL/hr over 60 Minutes Intravenous  Once 03/31/20 1954 03/31/20 2002      Subjective: Patient seen and evaluated today with no new acute complaints or concerns. No acute concerns or events noted overnight.  He is noted to have some diarrhea this morning.  He is otherwise tolerating diet.  Objective: Vitals:   04/03/20 2020 04/04/20 0434 04/04/20 1600 04/04/20 2138  BP: (!) 144/92 (!) 155/80 (!) 152/84 (!) 148/84  Pulse: 64 83 82 87  Resp: 20 20 16 14   Temp: 98.8 F (37.1 C) 98.3 F (36.8 C) 98.4 F (36.9 C) 98.2 F (36.8 C)  TempSrc: Oral Oral Oral Oral  SpO2: 98% 96% 100% 99%  Weight:      Height:        Intake/Output Summary (Last 24 hours) at 04/05/2020 1117 Last data filed at 04/05/2020 0500 Gross per 24 hour  Intake 526.28 ml  Output 426 ml  Net 100.28 ml   Filed Weights   04/01/20 0206 04/02/20 0500  04/02/20 1930  Weight: 86 kg 86 kg 86.4 kg    Examination:  General exam: Appears calm and comfortable  Respiratory system: Clear to auscultation. Respiratory effort normal. Cardiovascular system: S1 & S2 heard, RRR.  Gastrointestinal system: Abdomen is nondistended, soft and nontender.  Central nervous system: Alert and oriented. No focal neurological deficits. Extremities: Symmetric 5 x 5 power. Skin: No rashes, lesions or ulcers Psychiatry: Judgement and insight appear normal. Mood & affect appropriate.     Data Reviewed: I have personally reviewed following labs and imaging studies  CBC: Recent Labs  Lab 04/01/20 0323 04/01/20 0323 04/02/20 0531  04/03/20 0604 04/03/20 2122 04/04/20 0417 04/05/20 0756  WBC 42.4*  --  25.4* 23.4*  --  18.7* 19.2*  NEUTROABS 38.2*  --  23.1* 20.2*  --  15.3* 15.9*  HGB 8.8*   < > 7.5* 6.9* 9.5* 9.1* 10.1*  HCT 27.6*   < > 23.5* 21.6* 29.5* 28.3* 31.2*  MCV 84.9  --  83.0 83.7  --  84.5 84.8  PLT 577*  --  506* 442*  --  431* 456*   < > = values in this interval not displayed.   Basic Metabolic Panel: Recent Labs  Lab 04/01/20 0323 04/02/20 0531 04/03/20 0604 04/04/20 0417 04/05/20 0756  NA 129* 132* 131* 131* 134*  K 5.0 4.1 3.5 4.0 4.3  CL 99 104 104 104 101  CO2 17* 17* 18* 19* 23  GLUCOSE 122* 104* 98 74 94  BUN 27* 28* 21 17 13   CREATININE 1.71* 1.35* 0.97 0.82 0.87  CALCIUM 7.8* 7.9* 7.9* 7.8* 8.2*  MG  --   --  1.7 1.5* 1.5*   GFR: Estimated Creatinine Clearance: 78.1 mL/min (by C-G formula based on SCr of 0.87 mg/dL). Liver Function Tests: Recent Labs  Lab 03/31/20 1900 04/01/20 0323 04/02/20 0531 04/03/20 0604  AST 54* 39 21 20  ALT 15 11 15 11   ALKPHOS 87 70 60 55  BILITOT 1.0 1.3* 0.8 0.5  PROT 7.6 6.2* 5.8* 5.5*  ALBUMIN 2.8* 2.2* 2.0* 1.9*   Recent Labs  Lab 03/31/20 1900  LIPASE 41   No results for input(s): AMMONIA in the last 168 hours. Coagulation Profile: Recent Labs  Lab 03/31/20 1900  INR 1.8*   Cardiac Enzymes: No results for input(s): CKTOTAL, CKMB, CKMBINDEX, TROPONINI in the last 168 hours. BNP (last 3 results) No results for input(s): PROBNP in the last 8760 hours. HbA1C: No results for input(s): HGBA1C in the last 72 hours. CBG: No results for input(s): GLUCAP in the last 168 hours. Lipid Profile: No results for input(s): CHOL, HDL, LDLCALC, TRIG, CHOLHDL, LDLDIRECT in the last 72 hours. Thyroid Function Tests: Recent Labs    04/04/20 1136  TSH 4.096   Anemia Panel: Recent Labs    04/03/20 0841  VITAMINB12 470  FOLATE 10.4  FERRITIN 147  TIBC 152*  IRON 19*  RETICCTPCT 2.1   Sepsis Labs: Recent Labs  Lab  03/31/20 2055 03/31/20 2336 04/01/20 0323 04/03/20 0604 04/04/20 0417  PROCALCITON  --   --   --   --  10.69  LATICACIDVEN 5.7* 4.1* 2.3* 0.6  --     Recent Results (from the past 240 hour(s))  Blood culture (routine single)     Status: Abnormal   Collection Time: 03/31/20  7:00 PM   Specimen: Left Antecubital; Blood  Result Value Ref Range Status   Specimen Description   Final    LEFT ANTECUBITAL Performed at  [facility], [address] [place], [place] [address]    Special Requests   Final    BOTTLES DRAWN AEROBIC AND ANAEROBIC Blood Culture adequate volume Performed at [facility], [address], [place], [place] [address]    Culture  Setup Time   Final    GRAM NEGATIVE RODS ANAEROBIC BOTTLE Gram Stain Report Called to,Read Back By and Verified With: [email or web address]  BY MATTHEWS, B 9.7.21 [place] [facility] GRAM STAIN REVIEWED-AGREE WITH RESULT    Culture (A)  Final    FUSOBACTERIUM NUCLEATUM BETA LACTAMASE NEGATIVE Performed at [facility], [place] [address], [place], [place] [address]    Report Status 04/04/2020 FINAL  Final  SARS Coronavirus 2 by RT PCR (hospital order, performed in [facility] hospital lab) Nasopharyngeal Nasopharyngeal Swab     Status: None   Collection Time: 03/31/20  7:00 PM   Specimen: Nasopharyngeal Swab  Result Value Ref Range Status   SARS Coronavirus 2 NEGATIVE NEGATIVE Final    Comment: (NOTE) SARS-CoV-2 target nucleic acids are NOT DETECTED.  The SARS-CoV-2 RNA is generally detectable in upper and lower respiratory specimens during the acute phase of infection. The lowest concentration of SARS-CoV-2 viral copies this assay can detect is 250 copies / mL. A negative result does not preclude SARS-CoV-2 infection and should not be used as the sole basis for treatment or other patient management decisions.  A negative result may occur with improper specimen collection / handling, submission of specimen other than nasopharyngeal swab,  presence of viral mutation(s) within the areas targeted by this assay, and inadequate number of viral copies (<250 copies / mL). A negative result must be combined with clinical observations, patient history, and epidemiological information.  Fact Sheet for Patients:   StrictlyIdeas.no  Fact Sheet for Healthcare Providers: BankingDealers.co.za  This test is not yet approved or  cleared by the [place] FDA and has been authorized for detection and/or diagnosis of SARS-CoV-2 by FDA under an Emergency Use Authorization (EUA).  This EUA will remain in effect (meaning this test can be used) for the duration of the COVID-19 declaration under Section 564(b)(1) of the Act, 21 U.S.C. section 360bbb-3(b)(1), unless the authorization is terminated or revoked sooner.  Performed at [facility], [address], [place], [place] [address]   Blood Culture ID Panel (Reflexed)     Status: None   Collection Time: 03/31/20  7:00 PM  Result Value Ref Range Status   Enterococcus faecalis NOT DETECTED NOT DETECTED Final   Enterococcus Faecium NOT DETECTED NOT DETECTED Final   Listeria monocytogenes NOT DETECTED NOT DETECTED Final   Staphylococcus species NOT DETECTED NOT DETECTED Final   Staphylococcus aureus (BCID) NOT DETECTED NOT DETECTED Final   Staphylococcus epidermidis NOT DETECTED NOT DETECTED Final   Staphylococcus lugdunensis NOT DETECTED NOT DETECTED Final   Streptococcus species NOT DETECTED NOT DETECTED Final   Streptococcus agalactiae NOT DETECTED NOT DETECTED Final   Streptococcus pneumoniae NOT DETECTED NOT DETECTED Final   Streptococcus pyogenes NOT DETECTED NOT DETECTED Final   A.calcoaceticus-baumannii NOT DETECTED NOT DETECTED Final   Bacteroides fragilis NOT DETECTED NOT DETECTED Final   Enterobacterales NOT DETECTED NOT DETECTED Final   Enterobacter cloacae complex NOT DETECTED NOT DETECTED Final   Escherichia coli NOT DETECTED  NOT DETECTED Final   Klebsiella aerogenes NOT DETECTED NOT DETECTED Final   Klebsiella oxytoca NOT DETECTED NOT DETECTED Final   Klebsiella pneumoniae NOT DETECTED NOT DETECTED Final   Proteus species NOT DETECTED NOT DETECTED  Final   Salmonella species NOT DETECTED NOT DETECTED Final   Serratia marcescens NOT DETECTED NOT DETECTED Final   Haemophilus influenzae NOT DETECTED NOT DETECTED Final   Neisseria meningitidis NOT DETECTED NOT DETECTED Final   Pseudomonas aeruginosa NOT DETECTED NOT DETECTED Final   Stenotrophomonas maltophilia NOT DETECTED NOT DETECTED Final   Candida albicans NOT DETECTED NOT DETECTED Final   Candida auris NOT DETECTED NOT DETECTED Final   Candida glabrata NOT DETECTED NOT DETECTED Final   Candida krusei NOT DETECTED NOT DETECTED Final   Candida parapsilosis NOT DETECTED NOT DETECTED Final   Candida tropicalis NOT DETECTED NOT DETECTED Final   Cryptococcus neoformans/gattii NOT DETECTED NOT DETECTED Final    Comment: Performed at [facility], [address] [place], [place] [address]  Culture, blood (single)     Status: None   Collection Time: 03/31/20  8:55 PM   Specimen: BLOOD  Result Value Ref Range Status   Specimen Description BLOOD RIGHT ANTECUBITAL  Final   Special Requests   Final    BOTTLES DRAWN AEROBIC AND ANAEROBIC Blood Culture adequate volume   Culture   Final    NO GROWTH 5 DAYS Performed at [facility], [address] [place], [place] [address]    Report Status 04/05/2020 FINAL  Final  Urine culture     Status: Abnormal   Collection Time: 04/01/20 12:44 AM   Specimen: In/Out Cath Urine  Result Value Ref Range Status   Specimen Description   Final    IN/OUT CATH URINE Performed at [facility], [address], [place], [place] [address]    Special Requests   Final    NONE Performed at [facility], [address] [place], [place] [address]    Culture (A)  Final    <10,000 COLONIES/mL INSIGNIFICANT GROWTH Performed at [facility], [place] [address], [place], [place] [address]    Report Status 04/02/2020 FINAL  Final  MRSA PCR Screening     Status: None   Collection Time: 04/01/20  1:54 AM   Specimen: Nasopharyngeal  Result Value Ref Range Status   MRSA by PCR NEGATIVE NEGATIVE Final    Comment:        The GeneXpert MRSA Assay (FDA approved for NASAL specimens only), is one component of a comprehensive MRSA colonization surveillance program. It is not intended to diagnose MRSA infection nor to guide or monitor treatment for MRSA infections. Performed at [facility], [address], [place], [place] [address]   C Difficile Quick Screen w PCR reflex     Status: None   Collection Time: 04/02/20  1:14 PM   Specimen: Stool  Result Value Ref Range Status   C Diff antigen NEGATIVE NEGATIVE Final   C Diff toxin NEGATIVE NEGATIVE Final   C Diff interpretation No C. difficile detected.  Final    Comment: Performed at [facility], [address], [place], [place] [address]  Gastrointestinal Panel by PCR , Stool     Status: None   Collection Time: 04/02/20  1:14 PM   Specimen: Stool  Result Value Ref Range Status   Campylobacter species NOT DETECTED NOT DETECTED Final   Plesimonas shigelloides NOT DETECTED NOT DETECTED Final   Salmonella species NOT DETECTED NOT DETECTED Final   Yersinia enterocolitica NOT DETECTED NOT DETECTED Final   Vibrio species NOT DETECTED NOT DETECTED Final   Vibrio cholerae NOT DETECTED NOT DETECTED Final   Enteroaggregative E coli (EAEC) NOT DETECTED NOT  DETECTED Final   Enteropathogenic E coli (EPEC) NOT DETECTED NOT DETECTED Final   Enterotoxigenic E coli (ETEC) NOT DETECTED NOT DETECTED Final   Shiga like toxin producing E coli (STEC) NOT DETECTED NOT DETECTED Final   Shigella/Enteroinvasive E coli (EIEC) NOT DETECTED NOT DETECTED Final   Cryptosporidium NOT DETECTED NOT DETECTED Final   Cyclospora cayetanensis NOT DETECTED NOT DETECTED Final   Entamoeba histolytica NOT  DETECTED NOT DETECTED Final   Giardia lamblia NOT DETECTED NOT DETECTED Final   Adenovirus F40/41 NOT DETECTED NOT DETECTED Final   Astrovirus NOT DETECTED NOT DETECTED Final   Norovirus GI/GII NOT DETECTED NOT DETECTED Final   Rotavirus A NOT DETECTED NOT DETECTED Final   Sapovirus (I, II, IV, and V) NOT DETECTED NOT DETECTED Final    Comment: Performed at [facility], [address] [place], [place] [address]         Radiology Studies: No results found.      Scheduled Meds: . sodium chloride   Intravenous Once  . ARIPiprazole  2 mg Oral QHS  . carbidopa-levodopa  1 tablet Oral BID  . Chlorhexidine Gluconate Cloth  6 each Topical Daily  . feeding supplement (ENSURE ENLIVE)  237 mL Oral TID BM  . loratadine  10 mg Oral Daily  . metoprolol tartrate  50 mg Oral BID  . OXcarbazepine  150 mg Oral BID  . pantoprazole (PROTONIX) IV  40 mg Intravenous Q24H  . simvastatin  20 mg Oral QHS  . venlafaxine XR  150 mg Oral QHS   Continuous Infusions: . ampicillin-sulbactam (UNASYN) IV 3 g (04/05/20 6045)  . magnesium sulfate bolus IVPB       LOS: 4 days    Time spent: 35 minutes    Levelle Edelen Darleen Crocker, DO [facility]  If 7PM-7AM, please contact night-coverage [email or web address] 04/05/2020, 11:17 AM

## 2020-04-06 LAB — BASIC METABOLIC PANEL
Anion gap: 8 (ref 5–15)
BUN: 13 mg/dL (ref 8–23)
CO2: 23 mmol/L (ref 22–32)
Calcium: 7.9 mg/dL — ABNORMAL LOW (ref 8.9–10.3)
Chloride: 99 mmol/L (ref 98–111)
Creatinine, Ser: 0.79 mg/dL (ref 0.61–1.24)
GFR calc Af Amer: >60 mL/min (ref >60)
GFR calc non Af Amer: >60 mL/min (ref >60)
Glucose, Bld: 120 mg/dL — ABNORMAL HIGH (ref 70–99)
Potassium: 4.1 mmol/L (ref 3.5–5.1)
Sodium: 130 mmol/L — ABNORMAL LOW (ref 135–145)

## 2020-04-06 LAB — CBC WITH DIFFERENTIAL/PLATELET
Abs Immature Granulocytes: 0.49 10*3/uL — ABNORMAL HIGH (ref 0.00–0.07)
Basophils Absolute: 0.1 10*3/uL (ref 0.0–0.1)
Basophils Relative: 0 %
Eosinophils Absolute: 0.2 10*3/uL (ref 0.0–0.5)
Eosinophils Relative: 1 %
HCT: 33 % — ABNORMAL LOW (ref 39.0–52.0)
Hemoglobin: 10.6 g/dL — ABNORMAL LOW (ref 13.0–17.0)
Immature Granulocytes: 3 %
Lymphocytes Relative: 11 %
Lymphs Abs: 1.9 10*3/uL (ref 0.7–4.0)
MCH: 27.1 pg (ref 26.0–34.0)
MCHC: 32.1 g/dL (ref 30.0–36.0)
MCV: 84.4 fL (ref 80.0–100.0)
Monocytes Absolute: 1.3 10*3/uL — ABNORMAL HIGH (ref 0.1–1.0)
Monocytes Relative: 7 %
Neutro Abs: 14.3 10*3/uL — ABNORMAL HIGH (ref 1.7–7.7)
Neutrophils Relative %: 78 %
Platelets: 475 10*3/uL — ABNORMAL HIGH (ref 150–400)
RBC: 3.91 MIL/uL — ABNORMAL LOW (ref 4.22–5.81)
RDW: 17.1 % — ABNORMAL HIGH (ref 11.5–15.5)
WBC: 18.3 10*3/uL — ABNORMAL HIGH (ref 4.0–10.5)
nRBC: 0 % (ref 0.0–0.2)

## 2020-04-06 LAB — MAGNESIUM: Magnesium: 1.6 mg/dL — ABNORMAL LOW (ref 1.7–2.4)

## 2020-04-06 MED ORDER — MAGNESIUM SULFATE 2 GM/50ML IV SOLN
2.0000 g | Freq: Once | INTRAVENOUS | Status: AC
  Administered 2020-04-06: 2 g via INTRAVENOUS
  Filled 2020-04-06: qty 50

## 2020-04-06 NOTE — Progress Notes (Signed)
PROGRESS NOTE    Nathan [name]  BSJ:[id number] DOB: Feb 09, 1946 DOA: 03/31/2020 PCP: Patient, No Pcp Per   Brief Narrative:  Per HPI: Nathan [name] a 74 y.o.malewith medical history significant ofDVT, GERD, hypertension, hyperlipidemia, PTSD, chronic renal insufficiency, history of seizures (last one was in 2004) who is coming to the emergency department due toprogressively worse abdominal pain associated with constipation, decreased oral intake, fatigue, malaise and postural dizziness for the past 3 days. He also had 2 episodes of nonbloody emesis in the past 24 hours. He denies melena or hematochezia. No dysuria, frequency or hematuria. However, he states that he has been urinating less than usual. He denies fever, chills, night sweats, rhinorrhea, sore throat, dyspnea, wheezing or hemoptysis. No chest pain, palpitations, diaphoresis, PND, orthopnea or recent pitting edema of the lower extremities. He denies polyuria, polydipsia, polyphagia or blurred vision.  9/8:Patient seen and evaluated this morning. Multiple loose, liquidy stools noted overnight. Plan to check C. difficile and GI panel. Leukocytosis improving on current antibiotics as well as other lab work. Hematology consultation requested.  9/9:Patient continues have ongoing diarrhea, but C. difficile testing negative. GI panel pending. Noted to have worsening anemia with no overt bleeding noted. Stool occult ordered and pending. 2 unit PRBC transfusion planned. Hematology consultation on 9/8 with likely leukemoid reaction noted. Hold Eliquis.  9/10:Patient's hemoglobin levels have improved to 9.1 this morning after PRBC transfusion. He will require some further magnesium supplementation. GI pathogen panel negative. Stool occult pending.His diarrhea has improved, but procalcitonin is still quite elevated. Continue current antibiotics. Monitor repeat CBC in a.m.  9/11: Patient noted to have isolated  Fusobacterium in 1 set of blood culture and has been switched to Unasyn for coverage.  Hemoglobin levels continue to remain stable, but he is Hemoccult positive and will require GI evaluation eventually.  Continue ongoing antibiotics as planned.  Monitor CBC.  9/12: Patient noted to have an episode of diarrhea yesterday, but is doing better today.  Plan to keep n.p.o. after midnight in case GI elects for endoscopic evaluation tomorrow.  Continue current antibiotics.  Anemia appears stable.  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 difficile testing negative. GI panelnegative -Procalcitonin elevated at 10.69 -Urine cultures with insignificant growth -Continue current antibiotics, now with Unasyn  Acute anemia-improved status post 2 unit PRBC transfusion on 9/9 -Stool occult positive, plan for GI consultation by 9/13 -Iron deficiency noted on anemia panel, but will hold off on iron transfusion given acute infection -Plan to keep n.p.o. 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  Seizure history -Continue oxycarbamazepine -Continue Sinemet  Hypertension -Hold lisinopril and  start metoprolol -Labetalol prn  GERD -PPI  Likelyleukemoid reaction -Appreciate hematology evaluation -Continue current antibiotics as ordered  Dyslipidemia -Resume home statin  History of prior DVT -Hold Eliquis for now with worsening anemia noted -Plan to resume if this remains stable by tomorrow and stool occult negative  Mild hypomagnesemia -Replete and reevaluate in a.m.   DVT prophylaxis:Eliquischanged to SCDs Code Status:Full Family Communication:Discussed withwife 9/8, 9/9, 9/10, 9/11, 9/12 Disposition Plan: Status is: Inpatient  Remains inpatient appropriate because:IV treatments appropriate due to intensity of illness or inability to take PO and Inpatient level of care appropriate due to severity of illness   Dispo: The patient is from:Home Anticipated d/c is WE:RXVQ Anticipated d/c date is:1-2days Patient currently is not medically stable to d/c.He is requiring PRBC transfusion 9/9 and is having ongoing diarrhea.  Consultants:  Heme/Onc  Procedures:  See below  Antimicrobials:  Anti-infectives (From admission, onward)   Start     Dose/Rate Route Frequency Ordered Stop   04/04/20 1800  Ampicillin-Sulbactam (UNASYN) 3 g in sodium chloride 0.9 % 100 mL IVPB        3 g 200 mL/hr over 30 Minutes Intravenous Every 6 hours 04/04/20 1420     04/03/20 1000  ceFEPIme (MAXIPIME) 2 g in sodium chloride 0.9 % 100 mL IVPB  Status:  Discontinued        2 g 200 mL/hr over 30 Minutes Intravenous Every 8 hours 04/03/20 0909 04/04/20 1420   04/01/20 0830  vancomycin (VANCOREADY) IVPB 750 mg/150 mL  Status:  Discontinued        750 mg 150 mL/hr over 60 Minutes Intravenous Every 12 hours 03/31/20 2024 04/01/20 1004   04/01/20 0400  metroNIDAZOLE (FLAGYL) IVPB 500 mg  Status:  Discontinued        500 mg 100 mL/hr over 60 Minutes Intravenous Every 8 hours 04/01/20 0026 04/04/20 1420   03/31/20 2030  ceFEPIme  (MAXIPIME) 2 g in sodium chloride 0.9 % 100 mL IVPB  Status:  Discontinued        2 g 200 mL/hr over 30 Minutes Intravenous Every 12 hours 03/31/20 2024 04/03/20 0909   03/31/20 2015  vancomycin (VANCOREADY) IVPB 2000 mg/400 mL        2,000 mg 200 mL/hr over 120 Minutes Intravenous  Once 03/31/20 2002 04/01/20 0027   03/31/20 2000  ceFEPIme (MAXIPIME) 2 g in sodium chloride 0.9 % 100 mL IVPB  Status:  Discontinued        2 g 200 mL/hr over 30 Minutes Intravenous  Once 03/31/20 1954 03/31/20 2024   03/31/20 2000  metroNIDAZOLE (FLAGYL) IVPB 500 mg        500 mg 100 mL/hr over 60 Minutes Intravenous  Once 03/31/20 1954 03/31/20 2159   03/31/20 2000  vancomycin (VANCOCIN) IVPB 1000 mg/200 mL premix  Status:  Discontinued        1,000 mg 200 mL/hr over 60 Minutes Intravenous  Once 03/31/20 1954 03/31/20 2002      Subjective: Patient seen and evaluated today with no new acute complaints or concerns.  He was noted to have 1 episode of diarrhea overnight.  No overt bleeding noted.  Objective: Vitals:   04/04/20 2138 04/05/20 1500 04/05/20 2123 04/06/20 0544  BP: (!) 148/84 (!) 155/78 (!) 149/91 (!) 153/98  Pulse: 87 85 94 98  Resp: 14 16 18 18   Temp: 98.2 F (36.8 C) 98.2 F (36.8 C) 98.8 F (37.1 C) 98.9 F (37.2 C)  TempSrc: Oral Oral    SpO2: 99% 100% 100% 97%  Weight:      Height:        Intake/Output Summary (  Last 24 hours) at 04/06/2020 1121 Last data filed at 04/06/2020 0548 Gross per 24 hour  Intake 867.48 ml  Output 1100 ml  Net -232.52 ml   Filed Weights   04/01/20 0206 04/02/20 0500 04/02/20 1930  Weight: 86 kg 86 kg 86.4 kg    Examination:  General exam: Appears calm and comfortable  Respiratory system: Clear to auscultation. Respiratory effort normal. Cardiovascular system: S1 & S2 heard, RRR. Gastrointestinal system: Abdomen is nondistended, soft and nontender.  Central nervous system: Alert and oriented. No focal neurological deficits. Extremities:  Symmetric 5 x 5 power. Skin: No rashes, lesions or ulcers Psychiatry: Judgement and insight appear normal. Mood & affect appropriate.     Data Reviewed: I have personally reviewed following labs and imaging studies  CBC: Recent Labs  Lab 04/02/20 0531 04/02/20 0531 04/03/20 0604 04/03/20 2122 04/04/20 0417 04/05/20 0756 04/06/20 0854  WBC 25.4*  --  23.4*  --  18.7* 19.2* 18.3*  NEUTROABS 23.1*  --  20.2*  --  15.3* 15.9* 14.3*  HGB 7.5*   < > 6.9* 9.5* 9.1* 10.1* 10.6*  HCT 23.5*   < > 21.6* 29.5* 28.3* 31.2* 33.0*  MCV 83.0  --  83.7  --  84.5 84.8 84.4  PLT 506*  --  442*  --  431* 456* 475*   < > = values in this interval not displayed.   Basic Metabolic Panel: Recent Labs  Lab 04/02/20 0531 04/03/20 0604 04/04/20 0417 04/05/20 0756 04/06/20 0854  NA 132* 131* 131* 134* 130*  K 4.1 3.5 4.0 4.3 4.1  CL 104 104 104 101 99  CO2 17* 18* 19* 23 23  GLUCOSE 104* 98 74 94 120*  BUN 28* 21 17 13 13   CREATININE 1.35* 0.97 0.82 0.87 0.79  CALCIUM 7.9* 7.9* 7.8* 8.2* 7.9*  MG  --  1.7 1.5* 1.5* 1.6*   GFR: Estimated Creatinine Clearance: 85 mL/min (by C-G formula based on SCr of 0.79 mg/dL). Liver Function Tests: Recent Labs  Lab 03/31/20 1900 04/01/20 0323 04/02/20 0531 04/03/20 0604  AST 54* 39 21 20  ALT 15 11 15 11   ALKPHOS 87 70 60 55  BILITOT 1.0 1.3* 0.8 0.5  PROT 7.6 6.2* 5.8* 5.5*  ALBUMIN 2.8* 2.2* 2.0* 1.9*   Recent Labs  Lab 03/31/20 1900  LIPASE 41   No results for input(s): AMMONIA in the last 168 hours. Coagulation Profile: Recent Labs  Lab 03/31/20 1900  INR 1.8*   Cardiac Enzymes: No results for input(s): CKTOTAL, CKMB, CKMBINDEX, TROPONINI in the last 168 hours. BNP (last 3 results) No results for input(s): PROBNP in the last 8760 hours. HbA1C: No results for input(s): HGBA1C in the last 72 hours. CBG: No results for input(s): GLUCAP in the last 168 hours. Lipid Profile: No results for input(s): CHOL, HDL, LDLCALC, TRIG,  CHOLHDL, LDLDIRECT in the last 72 hours. Thyroid Function Tests: Recent Labs    04/04/20 1136  TSH 4.096   Anemia Panel: No results for input(s): VITAMINB12, FOLATE, FERRITIN, TIBC, IRON, RETICCTPCT in the last 72 hours. Sepsis Labs: Recent Labs  Lab 03/31/20 2055 03/31/20 2336 04/01/20 0323 04/03/20 0604 04/04/20 0417  PROCALCITON  --   --   --   --  10.69  LATICACIDVEN 5.7* 4.1* 2.3* 0.6  --     Recent Results (from the past 240 hour(s))  Blood culture (routine single)     Status: Abnormal   Collection Time: 03/31/20  7:00 PM   Specimen:  Left Antecubital; Blood  Result Value Ref Range Status   Specimen Description   Final    LEFT ANTECUBITAL Performed at [facility], [address] [place], [place] [address]    Special Requests   Final    BOTTLES DRAWN AEROBIC AND ANAEROBIC Blood Culture adequate volume Performed at [facility], [address], [place], [place] [address]    Culture  Setup Time   Final    GRAM NEGATIVE RODS ANAEROBIC BOTTLE Gram Stain Report Called to,Read Back By and Verified With: MURPHY,[email or web address]  BY MATTHEWS, B 9.7.21 [facility] GRAM STAIN REVIEWED-AGREE WITH RESULT    Culture (A)  Final    FUSOBACTERIUM NUCLEATUM BETA LACTAMASE NEGATIVE Performed at [facility], [place] [address], [place], [place] [address]    Report Status 04/04/2020 FINAL  Final  SARS Coronavirus 2 by RT PCR (hospital order, performed in [facility] hospital lab) Nasopharyngeal Nasopharyngeal Swab     Status: None   Collection Time: 03/31/20  7:00 PM   Specimen: Nasopharyngeal Swab  Result Value Ref Range Status   SARS Coronavirus 2 NEGATIVE NEGATIVE Final    Comment: (NOTE) SARS-CoV-2 target nucleic acids are NOT DETECTED.  The SARS-CoV-2 RNA is generally detectable in upper and lower respiratory specimens during the acute phase of infection. The lowest concentration of SARS-CoV-2 viral copies this assay can detect is 250 copies / mL. A negative result does  not preclude SARS-CoV-2 infection and should not be used as the sole basis for treatment or other patient management decisions.  A negative result may occur with improper specimen collection / handling, submission of specimen other than nasopharyngeal swab, presence of viral mutation(s) within the areas targeted by this assay, and inadequate number of viral copies (<250 copies / mL). A negative result must be combined with clinical observations, patient history, and epidemiological information.  Fact Sheet for Patients:   StrictlyIdeas.no  Fact Sheet for Healthcare Providers: BankingDealers.co.za  This test is not yet approved or  cleared by the [place] FDA and has been authorized for detection and/or diagnosis of SARS-CoV-2 by FDA under an Emergency Use Authorization (EUA).  This EUA will remain in effect (meaning this test can be used) for the duration of the COVID-19 declaration under Section 564(b)(1) of the Act, 21 U.S.C. section 360bbb-3(b)(1), unless the authorization is terminated or revoked sooner.  Performed at [facility], [address] [place], [place] [address]   Blood Culture ID Panel (Reflexed)     Status: None   Collection Time: 03/31/20  7:00 PM  Result Value Ref Range Status   Enterococcus faecalis NOT DETECTED NOT DETECTED Final   Enterococcus Faecium NOT DETECTED NOT DETECTED Final   Listeria monocytogenes NOT DETECTED NOT DETECTED Final   Staphylococcus species NOT DETECTED NOT DETECTED Final   Staphylococcus aureus (BCID) NOT DETECTED NOT DETECTED Final   Staphylococcus epidermidis NOT DETECTED NOT DETECTED Final   Staphylococcus lugdunensis NOT DETECTED NOT DETECTED Final   Streptococcus species NOT DETECTED NOT DETECTED Final   Streptococcus agalactiae NOT DETECTED NOT DETECTED Final   Streptococcus pneumoniae NOT DETECTED NOT DETECTED Final   Streptococcus pyogenes NOT DETECTED NOT DETECTED Final    A.calcoaceticus-baumannii NOT DETECTED NOT DETECTED Final   Bacteroides fragilis NOT DETECTED NOT DETECTED Final   Enterobacterales NOT DETECTED NOT DETECTED Final   Enterobacter cloacae complex NOT DETECTED NOT DETECTED Final   Escherichia coli NOT DETECTED NOT DETECTED Final   Klebsiella aerogenes NOT DETECTED NOT DETECTED Final   Klebsiella  oxytoca NOT DETECTED NOT DETECTED Final   Klebsiella pneumoniae NOT DETECTED NOT DETECTED Final   Proteus species NOT DETECTED NOT DETECTED Final   Salmonella species NOT DETECTED NOT DETECTED Final   Serratia marcescens NOT DETECTED NOT DETECTED Final   Haemophilus influenzae NOT DETECTED NOT DETECTED Final   Neisseria meningitidis NOT DETECTED NOT DETECTED Final   Pseudomonas aeruginosa NOT DETECTED NOT DETECTED Final   Stenotrophomonas maltophilia NOT DETECTED NOT DETECTED Final   Candida albicans NOT DETECTED NOT DETECTED Final   Candida auris NOT DETECTED NOT DETECTED Final   Candida glabrata NOT DETECTED NOT DETECTED Final   Candida krusei NOT DETECTED NOT DETECTED Final   Candida parapsilosis NOT DETECTED NOT DETECTED Final   Candida tropicalis NOT DETECTED NOT DETECTED Final   Cryptococcus neoformans/gattii NOT DETECTED NOT DETECTED Final    Comment: Performed at [facility], [place] [address], [place], [place] [address]  Culture, blood (single)     Status: None   Collection Time: 03/31/20  8:55 PM   Specimen: BLOOD  Result Value Ref Range Status   Specimen Description BLOOD RIGHT ANTECUBITAL  Final   Special Requests   Final    BOTTLES DRAWN AEROBIC AND ANAEROBIC Blood Culture adequate volume   Culture   Final    NO GROWTH 5 DAYS Performed at [facility], [address] [place], [place] [address]    Report Status 04/05/2020 FINAL  Final  Urine culture     Status: Abnormal   Collection Time: 04/01/20 12:44 AM   Specimen: In/Out Cath Urine  Result Value Ref Range Status   Specimen Description   Final    IN/OUT CATH  URINE Performed at [facility], [address] [place], [place] [address]    Special Requests   Final    NONE Performed at [facility], [address] [place], [place] [address]    Culture (A)  Final    <10,000 COLONIES/mL INSIGNIFICANT GROWTH Performed at [facility], [place] [address], [place], [place] [address]    Report Status 04/02/2020 FINAL  Final  MRSA PCR Screening     Status: None   Collection Time: 04/01/20  1:54 AM   Specimen: Nasopharyngeal  Result Value Ref Range Status   MRSA by PCR NEGATIVE NEGATIVE Final    Comment:        The GeneXpert MRSA Assay (FDA approved for NASAL specimens only), is one component of a comprehensive MRSA colonization surveillance program. It is not intended to diagnose MRSA infection nor to guide or monitor treatment for MRSA infections. Performed at [facility], [address], [place], [place] [address]   C Difficile Quick Screen w PCR reflex     Status: None   Collection Time: 04/02/20  1:14 PM   Specimen: Stool  Result Value Ref Range Status   C Diff antigen NEGATIVE NEGATIVE Final   C Diff toxin NEGATIVE NEGATIVE Final   C Diff interpretation No C. difficile detected.  Final    Comment: Performed at [facility], [address] [place], [place] [address]  Gastrointestinal Panel by PCR , Stool     Status: None   Collection Time: 04/02/20  1:14 PM   Specimen: Stool  Result Value Ref Range Status   Campylobacter species NOT DETECTED NOT DETECTED Final   Plesimonas shigelloides NOT DETECTED NOT DETECTED Final   Salmonella species NOT DETECTED NOT DETECTED Final   Yersinia enterocolitica NOT DETECTED NOT DETECTED Final   Vibrio species  NOT DETECTED NOT DETECTED Final   Vibrio cholerae NOT DETECTED NOT DETECTED Final   Enteroaggregative E coli (EAEC) NOT DETECTED NOT DETECTED Final   Enteropathogenic E coli (EPEC) NOT DETECTED NOT DETECTED Final   Enterotoxigenic E coli (ETEC) NOT DETECTED NOT DETECTED Final   Shiga like  toxin producing E coli (STEC) NOT DETECTED NOT DETECTED Final   Shigella/Enteroinvasive E coli (EIEC) NOT DETECTED NOT DETECTED Final   Cryptosporidium NOT DETECTED NOT DETECTED Final   Cyclospora cayetanensis NOT DETECTED NOT DETECTED Final   Entamoeba histolytica NOT DETECTED NOT DETECTED Final   Giardia lamblia NOT DETECTED NOT DETECTED Final   Adenovirus F40/41 NOT DETECTED NOT DETECTED Final   Astrovirus NOT DETECTED NOT DETECTED Final   Norovirus GI/GII NOT DETECTED NOT DETECTED Final   Rotavirus A NOT DETECTED NOT DETECTED Final   Sapovirus (I, II, IV, and V) NOT DETECTED NOT DETECTED Final    Comment: Performed at [facility], [address] [place], [place] [address]         Radiology Studies: No results found.      Scheduled Meds:  sodium chloride   Intravenous Once   ARIPiprazole  2 mg Oral QHS   carbidopa-levodopa  1 tablet Oral BID   Chlorhexidine Gluconate Cloth  6 each Topical Daily   feeding supplement (ENSURE ENLIVE)  237 mL Oral TID BM   loratadine  10 mg Oral Daily   metoprolol tartrate  50 mg Oral BID   OXcarbazepine  150 mg Oral BID   pantoprazole (PROTONIX) IV  40 mg Intravenous Q24H   simvastatin  20 mg Oral QHS   venlafaxine XR  150 mg Oral QHS   Continuous Infusions:  ampicillin-sulbactam (UNASYN) IV 3 g (04/06/20 0548)   magnesium sulfate bolus IVPB       LOS: 5 days    Time spent: 30 minutes    Vinnie Bobst Darleen Crocker, DO [facility]  If 7PM-7AM, please contact night-coverage [email or web address] 04/06/2020, 11:21 AM

## 2020-04-07 ENCOUNTER — Encounter (HOSPITAL_COMMUNITY): Payer: Self-pay | Admitting: Internal Medicine

## 2020-04-07 ENCOUNTER — Encounter (HOSPITAL_COMMUNITY): Payer: Self-pay | Admitting: Anesthesiology

## 2020-04-07 DIAGNOSIS — R195 Other fecal abnormalities: Secondary | ICD-10-CM

## 2020-04-07 LAB — BASIC METABOLIC PANEL
Anion gap: 9 (ref 5–15)
BUN: 15 mg/dL (ref 8–23)
CO2: 25 mmol/L (ref 22–32)
Calcium: 7.9 mg/dL — ABNORMAL LOW (ref 8.9–10.3)
Chloride: 95 mmol/L — ABNORMAL LOW (ref 98–111)
Creatinine, Ser: 0.77 mg/dL (ref 0.61–1.24)
GFR calc Af Amer: >60 mL/min (ref >60)
GFR calc non Af Amer: >60 mL/min (ref >60)
Glucose, Bld: 108 mg/dL — ABNORMAL HIGH (ref 70–99)
Potassium: 3.9 mmol/L (ref 3.5–5.1)
Sodium: 129 mmol/L — ABNORMAL LOW (ref 135–145)

## 2020-04-07 LAB — CBC WITH DIFFERENTIAL/PLATELET
Abs Immature Granulocytes: 0.21 10*3/uL — ABNORMAL HIGH (ref 0.00–0.07)
Basophils Absolute: 0.1 10*3/uL (ref 0.0–0.1)
Basophils Relative: 0 %
Eosinophils Absolute: 0.2 10*3/uL (ref 0.0–0.5)
Eosinophils Relative: 1 %
HCT: 32.1 % — ABNORMAL LOW (ref 39.0–52.0)
Hemoglobin: 10.3 g/dL — ABNORMAL LOW (ref 13.0–17.0)
Immature Granulocytes: 1 %
Lymphocytes Relative: 10 %
Lymphs Abs: 1.7 10*3/uL (ref 0.7–4.0)
MCH: 27.2 pg (ref 26.0–34.0)
MCHC: 32.1 g/dL (ref 30.0–36.0)
MCV: 84.9 fL (ref 80.0–100.0)
Monocytes Absolute: 1.4 10*3/uL — ABNORMAL HIGH (ref 0.1–1.0)
Monocytes Relative: 8 %
Neutro Abs: 13.7 10*3/uL — ABNORMAL HIGH (ref 1.7–7.7)
Neutrophils Relative %: 80 %
Platelets: 432 10*3/uL — ABNORMAL HIGH (ref 150–400)
RBC: 3.78 MIL/uL — ABNORMAL LOW (ref 4.22–5.81)
RDW: 17.1 % — ABNORMAL HIGH (ref 11.5–15.5)
WBC Morphology: INCREASED
WBC: 17.2 10*3/uL — ABNORMAL HIGH (ref 4.0–10.5)
nRBC: 0 % (ref 0.0–0.2)

## 2020-04-07 LAB — MAGNESIUM: Magnesium: 1.7 mg/dL (ref 1.7–2.4)

## 2020-04-07 MED ORDER — PEG 3350-KCL-NA BICARB-NACL 420 G PO SOLR
4000.0000 mL | Freq: Once | ORAL | Status: AC
  Administered 2020-04-07: 4000 mL via ORAL

## 2020-04-07 NOTE — Progress Notes (Signed)
Pharmacy Antibiotic Note  Today is day #4 of Unasyn therapy for this 74 yo male admitted on 03/31/2020 with sepsis.  WBC count remains elevated, but is trending down somewhat. Patient is currently afebrile and renal function is stable.  Plan: Continue Unasyn 3g IV q6h F/U cxs and clinical progress Monitor V/S, labs  Height: 5\' 11"  (180.3 cm) Weight: 86.4 kg (190 lb 7.6 oz) IBW/kg (Calculated) : 75.3  Temp (24hrs), Avg:98.5 F (36.9 C), Min:98.4 F (36.9 C), Max:98.6 F (37 C)  Recent Labs  Lab 03/31/20 1900 03/31/20 1900 03/31/20 2055 03/31/20 2336 04/01/20 0323 04/02/20 0531 04/03/20 0604 04/04/20 0417 04/05/20 0756 04/06/20 0854 04/07/20 0911  WBC 42.8*   < >  --   --  42.4*   < > 23.4* 18.7* 19.2* 18.3* 17.2*  CREATININE 1.55*   < >  --  1.62* 1.71*   < > 0.97 0.82 0.87 0.79 0.77  LATICACIDVEN 8.0*  --  5.7* 4.1* 2.3*  --  0.6  --   --   --   --    < > = values in this interval not displayed.    Estimated Creatinine Clearance: 85 mL/min (by C-G formula based on SCr of 0.77 mg/dL).    Allergies  Allergen Reactions   Terazosin Other (See Comments)    Syncope   Terazosin Other (See Comments)    unknown    Antimicrobials this admission: Unasyn 9/10>>  vancomycin 9/6 >> 9/7  cefepime 9/6>> 9/10  metronidazole 9/6 >>9/10  Microbiology results: 9/6 BCx X 1: fusobacterium nucleatum BCID- nothing detected 9/6 UCX: insignificant growth 9/7 MRSA PCR negative 9/8 C. Diff : neg x3 9/6 Resp PCR: SARS CoV-2 negative   Thank you for allowing pharmacy to be a part of this patients care.  Despina Pole, Pharm. D. Clinical Pharmacist 04/07/2020 3:06 PM

## 2020-04-07 NOTE — NC FL2 (Addendum)
[place] LEVEL OF CARE SCREENING TOOL     IDENTIFICATION  Patient Name: Nathan [name] Birthdate: 1945-10-31 Sex: male Admission Date (Current Location): 03/31/2020  [facility] and [place] Number:  [employer] and Address:  [facility] [address], [place]      Provider Number:    Attending Physician Name and Address:  Rodena Goldmann, DO  Relative Name and Phone Number:  Lynelle Smoke    Current Level of Care: SNF Recommended Level of Care: [place] Prior Approval Number:    Date Approved/Denied:  11/26/2019 PASRR Number:   [id number]  Discharge Plan: SNF    Current Diagnoses: Patient Active Problem List   Diagnosis Date Noted   Heme positive stool    Sepsis due to undetermined organism ([place]) 04/01/2020   Lactic acidosis 04/01/2020   AKI (acute kidney injury) ([place]) 04/01/2020   Normocytic anemia 04/01/2020   Thrombocytosis ([place]) 04/01/2020   Hyperlipidemia    Hyperglycemia    Fall down stairs 11/25/2019   Multiple rib fractures 11/25/2019   Traumatic closed fracture of distal clavicle with minimal displacement, left, initial encounter 11/25/2019   TBI (traumatic brain injury) ([place]) 11/22/2019   Recurrent pruritus/urticaria 04/25/2018   Seasonal and perennial allergic rhinitis 04/25/2018   Wheezing/dyspnea 04/25/2018   Coughing 04/25/2018   Acute encephalopathy 09/30/2017   BPH (benign prostatic hyperplasia) 09/30/2017   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   CVA (cerebral vascular accident) ([place]) 05/20/2016   Word finding difficulty    Bacteremia 05/19/2016   Fever 05/18/2016   Headache 05/18/2016   Leukocytosis 05/18/2016   Hypokalemia 03/22/2013   Sepsis ([place]) 03/21/2013   Thrombocytopenia ([place]) 03/21/2013   Acidosis 03/21/2013   Tachycardia 03/21/2013   UTI (lower urinary tract infection) 03/21/2013   Hyponatremia 03/21/2013   Seizure ([place])    PTSD  (post-traumatic stress disorder)    HTN (hypertension)    GERD (gastroesophageal reflux disease)     Orientation RESPIRATION BLADDER Height & Weight     Self  Normal Continent, External catheter Weight: 190 lb 7.6 oz (86.4 kg) Height:  5\' 11"  (180.3 cm)  BEHAVIORAL SYMPTOMS/MOOD NEUROLOGICAL BOWEL NUTRITION STATUS      Continent Diet (Diet clear liquid Room service appropriate? Yes; Fluid consistency: Thin)  AMBULATORY STATUS COMMUNICATION OF NEEDS Skin   Extensive Assist   Normal                       Personal Care Assistance Level of Assistance  Bathing, Dressing, Feeding Bathing Assistance: Maximum assistance Feeding assistance: Limited assistance Dressing Assistance: Limited assistance     Functional Limitations Info  Sight, Hearing, Speech Sight Info: Impaired Hearing Info: Adequate Speech Info: Adequate    SPECIAL CARE FACTORS FREQUENCY  PT (By licensed PT)     PT Frequency: 5x              Contractures Contractures Info: Not present    Additional Factors Info  Code Status, Allergies Code Status Info: Full Allergies Info: Terazosin           Current Medications (04/07/2020):  This is the current hospital active medication list Current Facility-Administered Medications  Medication Dose Route Frequency Provider Last Rate Last Admin   0.9 %  sodium chloride infusion (Manually program via Guardrails IV Fluids)   Intravenous Once Manuella Ghazi, Pratik D, DO       acetaminophen (TYLENOL) tablet 650 mg  650 mg Oral Q6H PRN Olevia Bowens,  Gerri Lins, MD   650 mg at 04/03/20 4332   Or   acetaminophen (TYLENOL) suppository 650 mg  650 mg Rectal Q6H PRN Reubin Milan, MD       alum & mag hydroxide-simeth (MAALOX/MYLANTA) 200-200-20 MG/5ML suspension 30 mL  30 mL Oral Q4H PRN Heath Lark D, DO   30 mL at 04/05/20 0604   Ampicillin-Sulbactam (UNASYN) 3 g in sodium chloride 0.9 % 100 mL IVPB  3 g Intravenous Q6H Shah, Pratik D, DO 200 mL/hr at 04/07/20 1218 3 g  at 04/07/20 1218   ARIPiprazole (ABILIFY) tablet 2 mg  2 mg Oral QHS Manuella Ghazi, Pratik D, DO   2 mg at 04/06/20 2203   carbidopa-levodopa (SINEMET IR) 25-100 MG per tablet immediate release 1 tablet  1 tablet Oral BID Manuella Ghazi, Pratik D, DO   1 tablet at 04/07/20 0920   Chlorhexidine Gluconate Cloth 2 % PADS 6 each  6 each Topical Daily Barton Dubois, MD   6 each at 04/05/20 0940   feeding supplement (ENSURE ENLIVE) (ENSURE ENLIVE) liquid 237 mL  237 mL Oral TID BM Manuella Ghazi, Pratik D, DO   237 mL at 04/06/20 2207   loperamide (IMODIUM) capsule 2 mg  2 mg Oral PRN Heath Lark D, DO   2 mg at 04/05/20 1829   loratadine (CLARITIN) tablet 10 mg  10 mg Oral Daily Manuella Ghazi, Pratik D, DO   10 mg at 04/07/20 0920   metoprolol tartrate (LOPRESSOR) tablet 50 mg  50 mg Oral BID Heath Lark D, DO   50 mg at 04/07/20 0921   ondansetron (ZOFRAN) tablet 4 mg  4 mg Oral Q6H PRN Reubin Milan, MD       Or   ondansetron [facility]) injection 4 mg  4 mg Intravenous Q6H PRN Reubin Milan, MD   4 mg at 04/02/20 1803   OXcarbazepine (TRILEPTAL) tablet 150 mg  150 mg Oral BID Heath Lark D, DO   150 mg at 04/07/20 0920   pantoprazole (PROTONIX) injection 40 mg  40 mg Intravenous Q24H Manuella Ghazi, Pratik D, DO   40 mg at 04/07/20 1212   polyethylene glycol-electrolytes (NuLYTELY) solution 4,000 mL  4,000 mL Oral Once Annitta Needs, NP       simvastatin (ZOCOR) tablet 20 mg  20 mg Oral QHS Shah, Pratik D, DO   20 mg at 04/06/20 2203   venlafaxine XR (EFFEXOR-XR) 24 hr capsule 150 mg  150 mg Oral QHS Shah, Pratik D, DO   150 mg at 04/06/20 2203     Discharge Medications: Please see discharge summary for a list of discharge medications.  Relevant Imaging Results:  Relevant Lab Results:   Additional Information Pt SSN: [id number]  Natasha Bence, LCSW

## 2020-04-07 NOTE — Progress Notes (Signed)
PROGRESS NOTE    Nathan [name]  MPN:[id number] DOB: [birth year] DOA: 03/31/2020 PCP: Patient, No Pcp Per   Brief Narrative:  Per HPI: Nathan [name] a 74 y.o.malewith medical history significant ofDVT, GERD, hypertension, hyperlipidemia, PTSD, chronic renal insufficiency, history of seizures (last one was in 2004) who is coming to the emergency department due toprogressively worse abdominal pain associated with constipation, decreased oral intake, fatigue, malaise and postural dizziness for the past 3 days. He also had 2 episodes of nonbloody emesis in the past 24 hours. He denies melena or hematochezia. No dysuria, frequency or hematuria. However, he states that he has been urinating less than usual. He denies fever, chills, night sweats, rhinorrhea, sore throat, dyspnea, wheezing or hemoptysis. No chest pain, palpitations, diaphoresis, PND, orthopnea or recent pitting edema of the lower extremities. He denies polyuria, polydipsia, polyphagia or blurred vision.  9/8:Patient seen and evaluated this morning. Multiple loose, liquidy stools noted overnight. Plan to check C. difficile and GI panel. Leukocytosis improving on current antibiotics as well as other lab work. Hematology consultation requested.  9/9:Patient continues have ongoing diarrhea, but C. difficile testing negative. GI panel pending. Noted to have worsening anemia with no overt bleeding noted. Stool occult ordered and pending. 2 unit PRBC transfusion planned. Hematology consultation on 9/8 with likely leukemoid reaction noted. Hold Eliquis.  9/10:Patient's hemoglobin levels have improved to 9.1 this morning after PRBC transfusion. He will require some further magnesium supplementation. GI pathogen panel negative. Stool occult pending.His diarrhea has improved, but procalcitonin is still quite elevated. Continue current antibiotics. Monitor repeat CBC in a.m.  9/11:Patient noted to have isolated  Fusobacterium in 1 set of blood culture and has been switched to Unasyn for coverage. Hemoglobin levels continue to remain stable, but he is Hemoccult positive and will require GI evaluation eventually. Continue ongoing antibiotics as planned. Monitor CBC.  9/12: Patient noted to have an episode of diarrhea yesterday, but is doing better today.  Plan to keep n.p.o. after midnight in case GI elects for endoscopic evaluation tomorrow.  Continue current antibiotics.  Anemia appears stable.  9/13: Patient denies any significant diarrhea at this point.  He is overall doing well.  GI has assessed patient with plans for EGD and colonoscopy tomorrow.Colon prep underway.  Hemoglobin levels remained stable.  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 difficile testing negative. GI panelnegative -Procalcitonin elevated at 10.69 -Urine cultures with insignificant growth -Continue current antibiotics, now with Unasyn -Plan for total 2-week antibiotic treatment to complete course with p.o. antibiotics, likely Flagyl  Acute anemia-improved status post 2 unit PRBC transfusion on 9/9 -Stool occultpositive -Iron deficiency noted on anemia panel, but will hold off on iron transfusion given acute infection -Appreciate GI consultation with plans for EGD and colonoscopy on 9/14, currently on clear liquid diet -Plan to keep n.p.o. 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  Seizure history -Continue oxycarbamazepine -Continue Sinemet  Hypertension -Hold lisinopril and start metoprolol -Labetalol prn  GERD -PPI  Likelyleukemoid reaction -Appreciate hematology evaluation -Continue current antibiotics as ordered  Dyslipidemia -Resume home statin  History of prior DVT -Hold Eliquis for now with worsening anemia noted -Plan to resume if this remains stable by tomorrow and stool occult negative  Mild hypomagnesemia-resolved -Reevaluate in a.m.   DVT prophylaxis:SCDs Code Status:Full Family Communication:Discussed withwife 9/13 Disposition Plan: Status is: Inpatient  Remains inpatient appropriate because:IV treatments appropriate due to intensity of illness or inability to take PO and Inpatient level of care appropriate due to severity of illness   Dispo: The patient is from:Home Anticipated d/c is to:SNF Anticipated d/c date is:1-2days Patient currently is not medically stable to d/c. He will require endoscopy while inpatient on account of his acute anemia with positive Hemoccult.  Consultants:  Heme/Onc  Procedures:  See below  Antimicrobials:  Anti-infectives (From admission, onward)   Start     Dose/Rate Route Frequency Ordered Stop   04/04/20 1800  Ampicillin-Sulbactam (UNASYN) 3 g in sodium chloride 0.9 % 100 mL IVPB        3 g 200 mL/hr over 30 Minutes Intravenous Every 6 hours 04/04/20 1420     04/03/20 1000  ceFEPIme (MAXIPIME) 2 g in sodium chloride 0.9 % 100 mL IVPB  Status:  Discontinued        2 g 200 mL/hr over 30 Minutes Intravenous Every 8 hours 04/03/20  0909 04/04/20 1420   04/01/20 0830  vancomycin (VANCOREADY) IVPB 750 mg/150 mL  Status:  Discontinued        750 mg 150 mL/hr over 60 Minutes Intravenous Every 12 hours 03/31/20 2024 04/01/20 1004   04/01/20 0400  metroNIDAZOLE (FLAGYL) IVPB 500 mg  Status:  Discontinued        500 mg 100 mL/hr over 60 Minutes Intravenous Every 8 hours 04/01/20 0026 04/04/20 1420   03/31/20 2030  ceFEPIme (MAXIPIME) 2 g in sodium chloride 0.9 % 100 mL IVPB  Status:  Discontinued        2 g 200 mL/hr over 30 Minutes Intravenous Every 12 hours 03/31/20 2024 04/03/20 0909   03/31/20 2015  vancomycin (VANCOREADY) IVPB 2000 mg/400 mL        2,000 mg 200 mL/hr over 120 Minutes Intravenous  Once 03/31/20 2002 04/01/20 0027   03/31/20 2000  ceFEPIme (MAXIPIME) 2 g in sodium chloride 0.9 % 100 mL IVPB  Status:  Discontinued        2 g 200 mL/hr over 30 Minutes Intravenous  Once 03/31/20 1954 03/31/20 2024   03/31/20 2000  metroNIDAZOLE (FLAGYL) IVPB 500 mg        500 mg 100 mL/hr over 60 Minutes Intravenous  Once 03/31/20 1954 03/31/20 2159   03/31/20 2000  vancomycin (VANCOCIN) IVPB 1000 mg/200 mL premix  Status:  Discontinued        1,000 mg 200 mL/hr over 60 Minutes Intravenous  Once 03/31/20 1954 03/31/20 2002       Subjective: Patient seen and evaluated today with no new acute complaints or concerns. No acute concerns or events noted overnight.  Objective: Vitals:   04/06/20 0544 04/06/20 2203 04/07/20 0533 04/07/20 0919  BP: (!) 153/98 (!) 165/98 (!) 155/96 127/77  Pulse: 98 100 91 97  Resp: 18 16 16    Temp:  98.9 F (37.2 C) 98.6 F (37 C) 98.4 F (36.9 C)   TempSrc:  Oral Oral   SpO2: 97% 95% 97%   Weight:      Height:        Intake/Output Summary (Last 24 hours) at 04/07/2020 1339 Last data filed at 04/07/2020 0612 Gross per 24 hour  Intake --  Output 1400 ml  Net -1400 ml   Filed Weights   04/01/20 0206 04/02/20 0500 04/02/20 1930  Weight: 86 kg 86 kg 86.4 kg     Examination:  General exam: Appears calm and comfortable  Respiratory system: Clear to auscultation. Respiratory effort normal. Cardiovascular system: S1 & S2 heard, RRR.  Gastrointestinal system: Abdomen is nondistended, soft and nontender.  Central nervous system: Alert and oriented. No focal neurological deficits. Extremities: Symmetric 5 x 5 power. Skin: No rashes, lesions or ulcers Psychiatry: Judgement and insight appear normal. Mood & affect appropriate.     Data Reviewed: I have personally reviewed following labs and imaging studies  CBC: Recent Labs  Lab 04/03/20 0604 04/03/20 0604 04/03/20 2122 04/04/20 0417 04/05/20 0756 04/06/20 0854 04/07/20 0911  WBC 23.4*  --   --  18.7* 19.2* 18.3* 17.2*  NEUTROABS 20.2*  --   --  15.3* 15.9* 14.3* 13.7*  HGB 6.9*   < > 9.5* 9.1* 10.1* 10.6* 10.3*  HCT 21.6*   < > 29.5* 28.3* 31.2* 33.0* 32.1*  MCV 83.7  --   --  84.5 84.8 84.4 84.9  PLT 442*  --   --  431* 456* 475* 432*   < > = values in this interval not displayed.   Basic Metabolic Panel: Recent Labs  Lab 04/03/20 0604 04/04/20 0417 04/05/20 0756 04/06/20 0854 04/07/20 0911  NA 131* 131* 134* 130* 129*  K 3.5 4.0 4.3 4.1 3.9  CL 104 104 101 99 95*  CO2 18* 19* 23 23 25   GLUCOSE 98 74 94 120* 108*  BUN 21 17 13 13 15   CREATININE 0.97 0.82 0.87 0.79 0.77  CALCIUM 7.9* 7.8* 8.2* 7.9* 7.9*  MG 1.7 1.5* 1.5* 1.6* 1.7   GFR: Estimated Creatinine Clearance: 85 mL/min (by C-G formula based on SCr of 0.77 mg/dL). Liver Function Tests: Recent Labs  Lab 03/31/20 1900 04/01/20 0323 04/02/20 0531 04/03/20 0604  AST 54* 39 21 20  ALT 15 11 15 11   ALKPHOS 87 70 60 55  BILITOT 1.0 1.3* 0.8 0.5  PROT 7.6 6.2* 5.8* 5.5*  ALBUMIN 2.8* 2.2* 2.0* 1.9*   Recent Labs  Lab 03/31/20 1900  LIPASE 41   No results for input(s): AMMONIA in the last 168 hours. Coagulation Profile: Recent Labs  Lab 03/31/20 1900  INR 1.8*   Cardiac Enzymes: No results for  input(s): CKTOTAL, CKMB, CKMBINDEX, TROPONINI in the last 168 hours. BNP (last 3 results) No results for input(s): PROBNP in the last 8760 hours. HbA1C: No results for input(s): HGBA1C in the last 72 hours. CBG: No results for input(s): GLUCAP in the last 168 hours. Lipid Profile: No results for input(s): CHOL, HDL, LDLCALC, TRIG, CHOLHDL, LDLDIRECT in the last 72 hours. Thyroid Function Tests: No results for input(s): TSH, T4TOTAL, FREET4, T3FREE, THYROIDAB in the last 72 hours. Anemia Panel: No results for input(s): VITAMINB12, FOLATE, FERRITIN, TIBC, IRON, RETICCTPCT in the last 72 hours. Sepsis Labs: Recent Labs  Lab 03/31/20 2055 03/31/20 2336 04/01/20 0323 04/03/20 0604 04/04/20 0417  PROCALCITON  --   --   --   --  10.69  LATICACIDVEN 5.7* 4.1* 2.3* 0.6  --     Recent Results (from the past 240 hour(s))  Blood culture (routine single)     Status: Abnormal   Collection Time: 03/31/20  7:00 PM   Specimen: Left Antecubital; Blood  Result Value Ref Range Status   Specimen Description   Final    LEFT ANTECUBITAL Performed at [facility], [address], [place], [place] [address]    Special Requests   Final    BOTTLES DRAWN AEROBIC AND ANAEROBIC Blood Culture adequate volume Performed at [place] [place], [place] [address]    Culture  Setup Time   Final    GRAM NEGATIVE RODS ANAEROBIC BOTTLE Gram Stain Report Called to,Read Back By and Verified With: MURPHY,[email or web address]  BY MATTHEWS, B 9.7.21 [place] [facility] GRAM STAIN REVIEWED-AGREE WITH RESULT    Culture (A)  Final    FUSOBACTERIUM NUCLEATUM BETA LACTAMASE NEGATIVE Performed at [facility], [place] [address] [place], [place] [address]    Report Status 04/04/2020 FINAL  Final  SARS Coronavirus 2 by RT PCR (hospital order, performed in [facility] hospital lab) Nasopharyngeal Nasopharyngeal Swab     Status: None   Collection Time: 03/31/20  7:00 PM   Specimen: Nasopharyngeal Swab  Result  Value Ref Range Status   SARS Coronavirus 2 NEGATIVE NEGATIVE Final    Comment: (NOTE) SARS-CoV-2 target nucleic acids are NOT DETECTED.  The SARS-CoV-2 RNA is generally detectable in upper and lower respiratory specimens during the acute phase of infection. The lowest concentration of SARS-CoV-2 viral copies this assay can detect is 250 copies / mL. A negative result does not preclude SARS-CoV-2 infection and should not be used as the sole basis for treatment or other patient management decisions.  A negative result may occur with improper specimen collection / handling, submission of specimen other than nasopharyngeal swab, presence of viral mutation(s) within the areas targeted by this assay, and inadequate number of viral copies (<250 copies / mL). A negative result must be combined with clinical observations, patient history, and epidemiological information.  Fact Sheet for Patients:   StrictlyIdeas.no  Fact Sheet for Healthcare Providers: BankingDealers.co.za  This test is not yet approved or  cleared by the [place] FDA and has been authorized for detection and/or diagnosis of SARS-CoV-2 by FDA under an Emergency Use Authorization (EUA).  This EUA will remain in effect (meaning this test can be used) for the duration of the COVID-19 declaration under Section 564(b)(1) of the Act, 21 U.S.C. section 360bbb-3(b)(1), unless the authorization is terminated or revoked sooner.  Performed at [facility], [address], [place], [place] [address]   Blood Culture ID Panel (Reflexed)     Status: None   Collection Time: 03/31/20  7:00 PM  Result Value Ref Range Status   Enterococcus faecalis NOT DETECTED NOT DETECTED Final   Enterococcus Faecium NOT DETECTED NOT DETECTED Final   Listeria monocytogenes NOT DETECTED NOT DETECTED Final   Staphylococcus species NOT DETECTED NOT DETECTED Final   Staphylococcus aureus (BCID) NOT  DETECTED NOT DETECTED Final   Staphylococcus epidermidis NOT DETECTED NOT DETECTED Final   Staphylococcus lugdunensis NOT DETECTED NOT DETECTED Final   Streptococcus species NOT DETECTED NOT DETECTED Final   Streptococcus agalactiae NOT DETECTED NOT DETECTED Final   Streptococcus pneumoniae NOT DETECTED NOT DETECTED Final   Streptococcus pyogenes NOT DETECTED NOT DETECTED Final   A.calcoaceticus-baumannii NOT DETECTED NOT DETECTED Final   Bacteroides fragilis NOT DETECTED  NOT DETECTED Final   Enterobacterales NOT DETECTED NOT DETECTED Final   Enterobacter cloacae complex NOT DETECTED NOT DETECTED Final   Escherichia coli NOT DETECTED NOT DETECTED Final   Klebsiella aerogenes NOT DETECTED NOT DETECTED Final   Klebsiella oxytoca NOT DETECTED NOT DETECTED Final   Klebsiella pneumoniae NOT DETECTED NOT DETECTED Final   Proteus species NOT DETECTED NOT DETECTED Final   Salmonella species NOT DETECTED NOT DETECTED Final   Serratia marcescens NOT DETECTED NOT DETECTED Final   Haemophilus influenzae NOT DETECTED NOT DETECTED Final   Neisseria meningitidis NOT DETECTED NOT DETECTED Final   Pseudomonas aeruginosa NOT DETECTED NOT DETECTED Final   Stenotrophomonas maltophilia NOT DETECTED NOT DETECTED Final   Candida albicans NOT DETECTED NOT DETECTED Final   Candida auris NOT DETECTED NOT DETECTED Final   Candida glabrata NOT DETECTED NOT DETECTED Final   Candida krusei NOT DETECTED NOT DETECTED Final   Candida parapsilosis NOT DETECTED NOT DETECTED Final   Candida tropicalis NOT DETECTED NOT DETECTED Final   Cryptococcus neoformans/gattii NOT DETECTED NOT DETECTED Final    Comment: Performed at [facility], [place] [address] [place], [place] [address]  Culture, blood (single)     Status: None   Collection Time: 03/31/20  8:55 PM   Specimen: BLOOD  Result Value Ref Range Status   Specimen Description BLOOD RIGHT ANTECUBITAL  Final   Special Requests   Final    BOTTLES DRAWN AEROBIC AND  ANAEROBIC Blood Culture adequate volume   Culture   Final    NO GROWTH 5 DAYS Performed at [facility], [address], [place], [place] [address]    Report Status 04/05/2020 FINAL  Final  Urine culture     Status: Abnormal   Collection Time: 04/01/20 12:44 AM   Specimen: In/Out Cath Urine  Result Value Ref Range Status   Specimen Description   Final    IN/OUT CATH URINE Performed at [facility], [address], [place], [place] [address]    Special Requests   Final    NONE Performed at [facility], [address] [place], [place] [address]    Culture (A)  Final    <10,000 COLONIES/mL INSIGNIFICANT GROWTH Performed at [facility], [place] [address], [place], [place] [address]    Report Status 04/02/2020 FINAL  Final  MRSA PCR Screening     Status: None   Collection Time: 04/01/20  1:54 AM   Specimen: Nasopharyngeal  Result Value Ref Range Status   MRSA by PCR NEGATIVE NEGATIVE Final    Comment:        The GeneXpert MRSA Assay (FDA approved for NASAL specimens only), is one component of a comprehensive MRSA colonization surveillance program. It is not intended to diagnose MRSA infection nor to guide or monitor treatment for MRSA infections. Performed at [facility], [address], [place], [place] [address]   C Difficile Quick Screen w PCR reflex     Status: None   Collection Time: 04/02/20  1:14 PM   Specimen: Stool  Result Value Ref Range Status   C Diff antigen NEGATIVE NEGATIVE Final   C Diff toxin NEGATIVE NEGATIVE Final   C Diff interpretation No C. difficile detected.  Final    Comment: Performed at [facility], [address], [place], [place] [address]  Gastrointestinal Panel by PCR , Stool     Status: None   Collection Time: 04/02/20  1:14 PM   Specimen: Stool  Result Value Ref  Range Status   Campylobacter species NOT DETECTED NOT DETECTED Final   Plesimonas shigelloides NOT DETECTED NOT DETECTED Final   Salmonella species NOT DETECTED NOT DETECTED  Final   Yersinia enterocolitica NOT DETECTED NOT DETECTED Final   Vibrio species NOT DETECTED NOT DETECTED Final   Vibrio cholerae NOT DETECTED NOT DETECTED Final   Enteroaggregative E coli (EAEC) NOT DETECTED NOT DETECTED Final   Enteropathogenic E coli (EPEC) NOT DETECTED NOT DETECTED Final   Enterotoxigenic E coli (ETEC) NOT DETECTED NOT DETECTED Final   Shiga like toxin producing E coli (STEC) NOT DETECTED NOT DETECTED Final   Shigella/Enteroinvasive E coli (EIEC) NOT DETECTED NOT DETECTED Final   Cryptosporidium NOT DETECTED NOT DETECTED Final   Cyclospora cayetanensis NOT DETECTED NOT DETECTED Final   Entamoeba histolytica NOT DETECTED NOT DETECTED Final   Giardia lamblia NOT DETECTED NOT DETECTED Final   Adenovirus F40/41 NOT DETECTED NOT DETECTED Final   Astrovirus NOT DETECTED NOT DETECTED Final   Norovirus GI/GII NOT DETECTED NOT DETECTED Final   Rotavirus A NOT DETECTED NOT DETECTED Final   Sapovirus (I, II, IV, and V) NOT DETECTED NOT DETECTED Final    Comment: Performed at [facility], [address], [place], [place] [address]         Radiology Studies: No results found.      Scheduled Meds: . sodium chloride   Intravenous Once  . ARIPiprazole  2 mg Oral QHS  . carbidopa-levodopa  1 tablet Oral BID  . Chlorhexidine Gluconate Cloth  6 each Topical Daily  . feeding supplement (ENSURE ENLIVE)  237 mL Oral TID BM  . loratadine  10 mg Oral Daily  . metoprolol tartrate  50 mg Oral BID  . OXcarbazepine  150 mg Oral BID  . pantoprazole (PROTONIX) IV  40 mg Intravenous Q24H  . polyethylene glycol-electrolytes  4,000 mL Oral Once  . simvastatin  20 mg Oral QHS  . venlafaxine XR  150 mg Oral QHS   Continuous Infusions: . ampicillin-sulbactam (UNASYN) IV 3 g (04/07/20 1218)     LOS: 6 days    Time spent: 30 minutes    Azaya Goedde Darleen Crocker, DO [facility]  If 7PM-7AM, please contact night-coverage [email or web address] 04/07/2020, 1:39 PM

## 2020-04-07 NOTE — Consult Note (Signed)
Referring Provider: Dr. Manuella Ghazi  Primary Care Physician:  Patient, No Pcp Per Primary Gastroenterologist:  Dr. Abbey Chatters  Date of Admission: 03/31/20 Date of Consultation: 04/07/20  Reason for Consultation:    HPI:  Nathan [name] is a 74 y.o. 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 While in ED, he had multiple, loose bowel movements that continued for about 48 hours. GI pathogen panel and Cdiff both negative. Hgb 10 on admission, trending down during stay but dropping to 6.9 during admission after receiving one dose of Eliquis, which he had been on as an outpatient. Received 2 units PRBCs. Markedly elevated leukocytosis of 42.8 on admission, felt to be leukemoid reaction and evaluated by Hematology. Found to have heme positive stool but without overt GI bleeding. CT abd/pelvis with contrast on 9/6 with fluid distending the distal esophagus, stomach, and small bowel loops with fecalization of small bowel contents. Mixed liquid and stool throughout colon, formed stool distending the rectum and query fecal impaction. He had multiple bowel movements after admission, but this has now resolved. One BM today.   Patient is a limited historian. He does report chronic constipation, which prompted him to present to the ED. He denies taking anything for constipation prior to admission. He is unable to tell me how often he has a BM. He denies any chronic therapy for constipation. Last BM this morning. No overt GI bleeding. No prior colonoscopy or EGD.   No abdominal pain currently. Good appetite. No N/V. No fever/chills. No dysphagia. He is NPO this morning in anticipation of possible endoscopy. No family history of colorectal cancer or polyps.   Last dose of Eliquis the afternoon of 9/8.     Past Medical History:  Diagnosis Date  . DVT (deep venous thrombosis) ([place])   . GERD  (gastroesophageal reflux disease)   . HTN (hypertension)   . Hyperlipidemia   . Hypertension   . PTSD (post-traumatic stress disorder)   . Renal disorder   . Seizure ([place])    last one 2004  . Seizures ([place])     Past Surgical History:  Procedure Laterality Date  . JOINT REPLACEMENT     Left knee, R shoulder  . SHOULDER ARTHROSCOPY Left 2016    Prior to Admission medications   Medication Sig Start Date End Date Taking? Authorizing Provider  acetaminophen (TYLENOL) 325 MG tablet Take 2 tablets (650 mg total) by mouth every 6 (six) hours. 11/28/19  Yes Barkley Boards R, PA-C  albuterol (PROVENTIL HFA;VENTOLIN HFA) 108 (90 Base) MCG/ACT inhaler Inhale 2 puffs into the lungs every 6 (six) hours as needed for wheezing or shortness of breath. 04/25/18  Yes Bobbitt, Sedalia Muta, MD  apixaban (ELIQUIS) 5 MG TABS tablet Take 2 tablets (10 mg total) by mouth in the morning and at bedtime. 12/06/19  Yes Barkley Boards R, PA-C  ARIPiprazole (ABILIFY) 2 MG tablet Take 2 mg by mouth at bedtime.   Yes [provider]  carbidopa-levodopa (SINEMET IR) 25-100 MG tablet Take 1 tablet by mouth in the morning and at bedtime.   Yes [provider]  cetirizine (ZYRTEC) 10 MG tablet Take 10 mg by mouth daily.   Yes [provider]  famotidine (PEPCID) 20 MG tablet Take 1 tablet (20 mg total) by mouth 2 (two) times daily. 04/25/18  Yes Bobbitt, Sedalia Muta, MD  gabapentin (NEURONTIN) 300 MG capsule Take 300 mg  by mouth 5 (five) times daily.   Yes [provider]  lisinopril (PRINIVIL,ZESTRIL) 40 MG tablet Take 0.5 tablets (20 mg total) by mouth every morning. 10/02/17  Yes Barton Dubois, MD  methocarbamol (ROBAXIN) 500 MG tablet Take 1 tablet (500 mg total) by mouth every 6 (six) hours. 11/28/19  Yes Barkley Boards R, PA-C  metoprolol tartrate (LOPRESSOR) 50 MG tablet Take 50 mg by mouth 2 (two) times daily.    Yes [provider]  Multiple Vitamin (MULTIVITAMIN WITH  MINERALS) TABS tablet Take 1 tablet by mouth at bedtime.    Yes [provider]  Oxcarbazepine (TRILEPTAL) 300 MG tablet Take 1 tablet (300 mg total) by mouth 2 (two) times daily. Patient taking differently: Take 150 mg by mouth 2 (two) times daily.  10/02/17  Yes Barton Dubois, MD  simvastatin (ZOCOR) 40 MG tablet Take 20 mg by mouth at bedtime.    Yes [provider]  venlafaxine XR (EFFEXOR-XR) 150 MG 24 hr capsule Take 150 mg by mouth daily with breakfast.   Yes [provider]  azelastine (ASTELIN) 0.1 % nasal spray Place 2 sprays into both nostrils 2 (two) times daily. Patient not taking: Reported on 04/02/2020 04/25/18   Bobbitt, Sedalia Muta, MD  docusate sodium (COLACE) 100 MG capsule Take 1 capsule (100 mg total) by mouth 2 (two) times daily. Patient not taking: Reported on 04/02/2020 11/29/19   Jill Alexanders, PA-C  enoxaparin (LOVENOX) 30 MG/0.3ML injection Inject 0.3 mLs (30 mg total) into the skin every 12 (twelve) hours for 8 days. 11/28/19 12/06/19  Norm Parcel, PA-C  feeding supplement, ENSURE ENLIVE, (ENSURE ENLIVE) LIQD Take 237 mLs by mouth 3 (three) times daily between meals. Patient not taking: Reported on 04/02/2020 11/28/19   Norm Parcel, PA-C  fexofenadine (ALLEGRA) 180 MG tablet Take 1 tablet (180 mg total) by mouth daily. Patient not taking: Reported on 04/02/2020 04/25/18   Adelina Mings, MD  fluticasone [facility]) 50 MCG/ACT nasal spray Place 1 spray into both nostrils daily. Patient not taking: Reported on 04/02/2020 10/02/17   Barton Dubois, MD  fluticasone [facility]) 50 MCG/ACT nasal spray Place 2 sprays into both nostrils daily. Patient not taking: Reported on 04/02/2020    [provider]  guaiFENesin (ROBITUSSIN) 100 MG/5ML SOLN Take 10 mLs (200 mg total) by mouth every 4 (four) hours. Patient not taking: Reported on 04/02/2020 11/28/19   Norm Parcel, PA-C  levETIRAcetam (KEPPRA) 500 MG tablet Take 1 tablet (500 mg total) by  mouth 2 (two) times daily for 8 days. 11/28/19 12/06/19  Norm Parcel, PA-C  loratadine (CLARITIN) 10 MG tablet Take 1 tablet (10 mg total) by mouth daily. Patient not taking: Reported on 04/02/2020 11/29/19   Norm Parcel, PA-C  oxyCODONE 10 MG TABS Take 0.5-1 tablets (5-10 mg total) by mouth every 4 (four) hours as needed for moderate pain or severe pain. Patient not taking: Reported on 04/02/2020 11/28/19   Norm Parcel, PA-C  polyethylene glycol (MIRALAX / GLYCOLAX) 17 g packet Take 17 g by mouth daily. Patient not taking: Reported on 04/02/2020 11/29/19   Jill Alexanders, PA-C  sodium chloride (OCEAN) 0.65 % SOLN nasal spray Place 1 spray into both nostrils as needed for congestion. Patient not taking: Reported on 04/02/2020 11/28/19   Norm Parcel, PA-C  tamsulosin (FLOMAX) 0.4 MG CAPS capsule Take 2 capsules (0.8 mg total) by mouth at bedtime. Patient not taking: Reported on 04/02/2020 10/02/17  Barton Dubois, MD    Current Facility-Administered Medications  Medication Dose Route Frequency Provider Last Rate Last Admin  . 0.9 %  sodium chloride infusion (Manually program via Guardrails IV Fluids)   Intravenous Once Manuella Ghazi, Pratik D, DO      . acetaminophen (TYLENOL) tablet 650 mg  650 mg Oral Q6H PRN Nathan Milan, MD   650 mg at 04/03/20 2671   Or  . acetaminophen (TYLENOL) suppository 650 mg  650 mg Rectal Q6H PRN Nathan Milan, MD      . alum & mag hydroxide-simeth (MAALOX/MYLANTA) 200-200-20 MG/5ML suspension 30 mL  30 mL Oral Q4H PRN Heath Lark D, DO   30 mL at 04/05/20 0604  . Ampicillin-Sulbactam (UNASYN) 3 g in sodium chloride 0.9 % 100 mL IVPB  3 g Intravenous Q6H Shah, Pratik D, DO 200 mL/hr at 04/07/20 0558 3 g at 04/07/20 0558  . ARIPiprazole (ABILIFY) tablet 2 mg  2 mg Oral QHS Shah, Pratik D, DO   2 mg at 04/06/20 2203  . carbidopa-levodopa (SINEMET IR) 25-100 MG per tablet immediate release 1 tablet  1 tablet Oral BID Manuella Ghazi, Pratik D, DO   1 tablet at 04/07/20  0920  . Chlorhexidine Gluconate Cloth 2 % PADS 6 each  6 each Topical Daily Barton Dubois, MD   6 each at 04/05/20 0940  . feeding supplement (ENSURE ENLIVE) (ENSURE ENLIVE) liquid 237 mL  237 mL Oral TID BM Manuella Ghazi, Pratik D, DO   237 mL at 04/06/20 2207  . loperamide (IMODIUM) capsule 2 mg  2 mg Oral PRN Manuella Ghazi, Pratik D, DO   2 mg at 04/05/20 1829  . loratadine (CLARITIN) tablet 10 mg  10 mg Oral Daily Manuella Ghazi, Pratik D, DO   10 mg at 04/07/20 0920  . metoprolol tartrate (LOPRESSOR) tablet 50 mg  50 mg Oral BID Heath Lark D, DO   50 mg at 04/07/20 0921  . ondansetron (ZOFRAN) tablet 4 mg  4 mg Oral Q6H PRN Nathan Milan, MD       Or  . ondansetron [facility]) injection 4 mg  4 mg Intravenous Q6H PRN Nathan Milan, MD   4 mg at 04/02/20 1803  . OXcarbazepine (TRILEPTAL) tablet 150 mg  150 mg Oral BID Heath Lark D, DO   150 mg at 04/07/20 0920  . pantoprazole (PROTONIX) injection 40 mg  40 mg Intravenous Q24H Manuella Ghazi, Pratik D, DO   40 mg at 04/06/20 1135  . simvastatin (ZOCOR) tablet 20 mg  20 mg Oral QHS Shah, Pratik D, DO   20 mg at 04/06/20 2203  . venlafaxine XR (EFFEXOR-XR) 24 hr capsule 150 mg  150 mg Oral QHS Shah, Pratik D, DO   150 mg at 04/06/20 2203    Allergies as of 03/31/2020 - Review Complete 03/31/2020  Allergen Reaction Noted  . Terazosin Other (See Comments) 01/26/2015  . Terazosin Other (See Comments) 11/22/2019    Family History  Problem Relation Age of Onset  . Colon cancer Neg Hx   . Colon polyps Neg Hx     Social History   Socioeconomic History  . Marital status: Married    Spouse name: Not on file  . Number of children: Not on file  . Years of education: Not on file  . Highest education level: Not on file  Occupational History  . Not on file  Tobacco Use  . Smoking status: Current Every Day Smoker    Years: 20.00  Types: Pipe  . Smokeless tobacco: Never Used  Vaping Use  . Vaping Use: Never used  Substance and Sexual Activity  . Alcohol use:  No  . Drug use: No  . Sexual activity: Not on file  Other Topics Concern  . Not on file  Social History Narrative   ** Merged History Encounter **       Social Determinants of Health   Financial Resource Strain:   . Difficulty of Paying Living Expenses: Not on file  Food Insecurity:   . Worried About Charity fundraiser in the Last Year: Not on file  . Ran Out of Food in the Last Year: Not on file  Transportation Needs:   . Lack of Transportation (Medical): Not on file  . Lack of Transportation (Non-Medical): Not on file  Physical Activity:   . Days of Exercise per Week: Not on file  . Minutes of Exercise per Session: Not on file  Stress:   . Feeling of Stress : Not on file  Social Connections:   . Frequency of Communication with Friends and Family: Not on file  . Frequency of Social Gatherings with Friends and Family: Not on file  . Attends Religious Services: Not on file  . Active Member of Clubs or Organizations: Not on file  . Attends Archivist Meetings: Not on file  . Marital Status: Not on file  Intimate Partner Violence:   . Fear of Current or Ex-Partner: Not on file  . Emotionally Abused: Not on file  . Physically Abused: Not on file  . Sexually Abused: Not on file    Review of Systems: Gen: Denies fever, chills, loss of appetite, change in weight or weight loss CV: Denies chest pain, heart palpitations, syncope, edema  Resp: Denies shortness of breath with rest, cough, wheezing GI: see HPI GU : Denies urinary burning, urinary frequency, urinary incontinence.  MS: Denies joint pain,swelling, cramping Derm: Denies rash, itching, dry skin Psych: Denies depression, anxiety,confusion, or memory loss Heme: see HPI  Physical Exam: Vital signs in last 24 hours: Temp:  [98.4 F (36.9 C)-98.6 F (37 C)] 98.4 F (36.9 C) (09/13 0533) Pulse Rate:  [91-100] 97 (09/13 0919) Resp:  [16] 16 (09/13 0533) BP: (127-165)/(77-98) 127/77 (09/13 0919) SpO2:  [95  %-97 %] 97 % (09/13 0533) Last BM Date: 04/05/20 General:   Alert,  Well-developed, well-nourished, pleasant and cooperative in NAD, flat affect with limited conversation. Oriented to person, place, situation. Head:  Normocephalic and atraumatic. Eyes:  Sclera clear, no icterus.   Ears:  Mildly hard of hearing  Nose:  No deformity, discharge,  or lesions. Lungs:  Clear throughout to auscultation.    Heart:  S1 S2 present without murmurs Abdomen:  Soft, nontender and nondistended. No masses, hepatosplenomegaly or hernias noted. Normal bowel sounds, without guarding, and without rebound.   Rectal:  Deferred  Msk:  Symmetrical without gross deformities. Normal posture. Extremities:  Without edema. Neurologic:  Alert and  oriented x4 Psych:  Flat affect  Intake/Output from previous day: 09/12 0701 - 09/13 0700 In: -  Out: 1400 [Urine:1400] Intake/Output this shift: No intake/output data recorded.  Lab Results: Recent Labs    04/05/20 0756 04/06/20 0854 04/07/20 0911  WBC 19.2* 18.3* 17.2*  HGB 10.1* 10.6* 10.3*  HCT 31.2* 33.0* 32.1*  PLT 456* 475* 432*   BMET Recent Labs    04/05/20 0756 04/06/20 0854 04/07/20 0911  NA 134* 130* 129*  K 4.3 4.1 3.9  CL 101 99 95*  CO2 23 23 25   GLUCOSE 94 120* 108*  BUN 13 13 15   CREATININE 0.87 0.79 0.77  CALCIUM 8.2* 7.9* 7.9*    Studies/Results: CT abd/pelvis with contrast on 9/6 IMPRESSION: 1. Fluid distending the distal esophagus, stomach, and small bowel loops with fecalization of small bowel contents. Mixed liquid and solid stool throughout the colon, with formed stool distending the rectum. Query fecal impaction. Findings most consistent with generalized slow transit/constipation. No evidence of bowel obstruction or inflammation. 2. Nondistended but thick walled urinary bladder, can be seen with cystitis. Recommend correlation with urinalysis. 3. Enlarged left inguinal node is new from prior exam, nonspecific. 4.  Absent renal excretion on delayed phase imaging suggests underlying renal dysfunction.   Impression: 74 year old male presenting to the ED with abdominal pain, constipation, non-bleeding emesis, admitted with sepsis and found to have Fusobacterium on blood cultures. Marked leukemoid reaction now with improving leukocytosis. Acute on chronic anemia with Hgb down to 6.9 during admission, receiving one dose of Eliquis the day prior. Notably, Hgb had been drifting down during admission, presenting with Hgb 10 and trending down daily. Received 2 units PRBCs with appropriate response, and Eliquis has been on hold with last dose 9/8. CT findings of constipation without bowel obstruction, and he had numerous liquid bowel movements in ED and for approximately 48 hours thereafter. Both GI pathogen panel and Cdiff were negative.   Abdominal pain has resolved after numerous bowel movements, and he has had resolution of diarrhea. Chronically noted to be constipated without real bowel regimen from what I can see on outpatient list (possibly Miralax and stool softener). He is a limited historian and difficult to obtain history.   Occult GI bleed noted while inpatient in setting of Eliquis but without overt melena or hematochezia. He was noted to be heme positive this admission. Hgb remaining stable after blood transfusion. No prior colonoscopy or EGD. Would benefit from this prior to discharge.   Clinically, he has improved from admission. Will discuss further the timing of procedures with Dr. Abbey Chatters. Would benefit from a more dedicated bowel regimen as outpatient.   Plan: Colonoscopy/EGD prior to discharge: to discuss timing. Likely not today, so we may prep patient for procedures.  Continue to hold Eliquis Continue PPI daily Will continue to follow with you   Annitta Needs, PhD, ANP-BC [facility] Gastroenterology     LOS: 6 days    04/07/2020, 11:05 AM

## 2020-04-07 NOTE — TOC Progression Note (Addendum)
Transition of Care [facility]) - Progression Note    Patient Details  Name: Nathan [name] MRN: [id number] Date of Birth: 1945/09/24  [facility]) CM/SW Contact  Natasha Bence, LCSW Phone Number: 04/07/2020, 4:30 PM  Clinical Narrative:    CSW received referral for SNF. CSW contacted patient's wife to discuss SNF Placement. Patient's wife agreeable to [place], [place], and [place] SNF's. Patient's spouse reported that they are not agreeable to go to [facility]. Patient's wife reported that she would only be agreeable to [facility] because of it's location, but wasn't sure if they were accepting patients. CSW contacted [facility] who reported that due to Covid, they still currently are not accepting SNF patient. Patient's spouse confirmed secondary insurance with [employer]. CSW started [place]. Ref #[id number]. CSW requested Cosign for FL2. CSW will fax clinicals once cosigned fl2 is recieved TOC to follow.   Expected Discharge Plan: [place] Barriers to Discharge: Continued Medical Work up  Expected Discharge Plan and Services   Expected Discharge Plan: [place] arrangements for the past 2 months: Single Family Home                                       Social Determinants of Health (SDOH) Interventions    Readmission Risk Interventions Readmission Risk Prevention Plan 04/07/2020 04/01/2020  Transportation Screening Complete Complete  PCP or Specialist Appt within 3-5 Days Complete -  HRI or Home Care Consult Not Complete -  Social Work Consult for [place] Planning/Counseling Complete -  Palliative Care Screening Not Applicable -  Medication Review Press photographer) Complete Complete  Palliative Care Screening - Not Applicable  Some recent data might be hidden

## 2020-04-08 ENCOUNTER — Encounter (HOSPITAL_COMMUNITY)
Admission: EM | Disposition: A | Discharge status: Skilled Nursing Facility | Payer: Self-pay | Source: Home / Self Care | Attending: Internal Medicine

## 2020-04-08 DIAGNOSIS — D649 Anemia, unspecified: Secondary | ICD-10-CM

## 2020-04-08 LAB — CBC
HCT: 30.8 % — ABNORMAL LOW (ref 39.0–52.0)
Hemoglobin: 9.8 g/dL — ABNORMAL LOW (ref 13.0–17.0)
MCH: 27.3 pg (ref 26.0–34.0)
MCHC: 31.8 g/dL (ref 30.0–36.0)
MCV: 85.8 fL (ref 80.0–100.0)
Platelets: 456 10*3/uL — ABNORMAL HIGH (ref 150–400)
RBC: 3.59 MIL/uL — ABNORMAL LOW (ref 4.22–5.81)
RDW: 16.9 % — ABNORMAL HIGH (ref 11.5–15.5)
WBC: 17.1 10*3/uL — ABNORMAL HIGH (ref 4.0–10.5)
nRBC: 0 % (ref 0.0–0.2)

## 2020-04-08 LAB — BASIC METABOLIC PANEL
Anion gap: 10 (ref 5–15)
BUN: 15 mg/dL (ref 8–23)
CO2: 24 mmol/L (ref 22–32)
Calcium: 7.9 mg/dL — ABNORMAL LOW (ref 8.9–10.3)
Chloride: 96 mmol/L — ABNORMAL LOW (ref 98–111)
Creatinine, Ser: 0.77 mg/dL (ref 0.61–1.24)
GFR calc Af Amer: >60 mL/min (ref >60)
GFR calc non Af Amer: >60 mL/min (ref >60)
Glucose, Bld: 93 mg/dL (ref 70–99)
Potassium: 3.6 mmol/L (ref 3.5–5.1)
Sodium: 130 mmol/L — ABNORMAL LOW (ref 135–145)

## 2020-04-08 LAB — MAGNESIUM: Magnesium: 1.6 mg/dL — ABNORMAL LOW (ref 1.7–2.4)

## 2020-04-08 SURGERY — COLONOSCOPY WITH PROPOFOL
Anesthesia: Monitor Anesthesia Care

## 2020-04-08 MED ORDER — POTASSIUM CHLORIDE CRYS ER 20 MEQ PO TBCR
40.0000 meq | EXTENDED_RELEASE_TABLET | Freq: Once | ORAL | Status: AC
  Administered 2020-04-08: 40 meq via ORAL
  Filled 2020-04-08: qty 2

## 2020-04-08 MED ORDER — POLYETHYLENE GLYCOL 3350 17 G PO PACK
255.0000 g | PACK | Freq: Once | ORAL | Status: AC
  Administered 2020-04-08: 255 g via ORAL
  Filled 2020-04-08: qty 15

## 2020-04-08 MED ORDER — MAGNESIUM SULFATE 2 GM/50ML IV SOLN
2.0000 g | Freq: Once | INTRAVENOUS | Status: AC
  Administered 2020-04-08: 2 g via INTRAVENOUS
  Filled 2020-04-08: qty 50

## 2020-04-08 NOTE — TOC Progression Note (Signed)
Transition of Care [facility]) - Progression Note    Patient Details  Name: Nathan [name] MRN: [id number] Date of Birth: [birth year]  Transition of Care [facility]) CM/SW Contact  Natasha Bence, LCSW Phone Number: 04/08/2020, 5:19 PM  Clinical Narrative:    CSW received call from [facility] requesting updated PT notes. Pt's last PT note was documented on 04/04/2020. PT was not able to perform PT eval on 04/08/2020 due to patient's procedure with GI. [place] reported that if claim for SNF would be denied and an appeal would have to be made if 04/04/2020 PT note was utilized for [employer] approval. [facility] rep suggested waiting for updated PT note to prevent denial and appeal. Auth will need to be resubmitted once PT note is updated. TOC to follow.   Expected Discharge Plan: [place] Barriers to Discharge: Continued Medical Work up  Expected Discharge Plan and Services Expected Discharge Plan: [place] arrangements for the past 2 months: Single Family Home                                       Social Determinants of Health (SDOH) Interventions    Readmission Risk Interventions Readmission Risk Prevention Plan 04/07/2020 04/01/2020  Transportation Screening Complete Complete  PCP or Specialist Appt within 3-5 Days Complete -  HRI or Home Care Consult Not Complete -  Social Work Consult for [place] Planning/Counseling Complete -  Palliative Care Screening Not Applicable -  Medication Review Press photographer) Complete Complete  Palliative Care Screening - Not Applicable  Some recent data might be hidden

## 2020-04-08 NOTE — Progress Notes (Signed)
PT Cancellation Note  Patient Details Name: EDREES VALENT MRN: [id number] DOB: [birth year]   Cancelled Treatment:    Reason Eval/Treat Not Completed: Other (comment) (Pt prepping for colonoscopy). Pt in bed, drinking bowel prep for colonoscopy and declines therapy at this time. Whill check back as schedule permits.    Tori Andrian Sabala PT, DPT 04/08/20, 1:32 PM [phone]

## 2020-04-08 NOTE — Progress Notes (Signed)
Pt drinking 3rd cup of miralax prep. Continues to require multiple, frequent reminders to drink. Wife currently at bedside, aware of need to take bowel prep and agrees to assist in reminders while she is here. Pt denies c/o.

## 2020-04-08 NOTE — Progress Notes (Signed)
Subjective: Thinks he did fine with his bowel prep. Container sitting on bedside table 2/3 of the way full. States the prep made him nauseated. Reports minimal epigastric abdominal pain this morning. Minimal nausea today. No vomiting yesterday when drinking bowel prep. Denies overt GI bleeding.    Denies NSAIDs at home. Reports being on omeprazole daily at home through the [place] which keeps GERD symptoms well controlled. Denies dysphagia.   Per nursing staff, patient is still having formed stool. No brbpr or melena.   There are only 2 documented stools yesterday.   Objective: Vital signs in last 24 hours: Temp:  [98.2 F (36.8 C)-98.5 F (36.9 C)] 98.5 F (36.9 C) (09/14 0545) Pulse Rate:  [95-106] 106 (09/14 0545) Resp:  [17-20] 20 (09/14 0545) BP: (127-162)/(77-98) 143/91 (09/14 0545) SpO2:  [95 %-98 %] 95 % (09/14 0545) Last BM Date: 04/08/20 General:   Alert and oriented. NAD. Resting comfortably. Gives only few word responses and is slow to respond.  Head:  Normocephalic and atraumatic. Eyes:  No icterus, sclera clear. Conjuctiva pink.  Abdomen:  Bowel sounds present, soft, non-tender, non-distended. No HSM or hernias noted. No rebound or guarding. No masses appreciated  Msk:  Symmetrical without gross deformities. Normal posture. Extremities:  Without edema. Neurologic:  Alert and  oriented x4 Skin:  Warm and dry, intact without significant lesions.  Psych:  Flat affect.   Intake/Output from previous day: 09/13 0701 - 09/14 0700 In: 800 [P.O.:800] Out: 1600 [Urine:1600] Intake/Output this shift: No intake/output data recorded.  Lab Results: Recent Labs    04/06/20 0854 04/07/20 0911 04/08/20 0553  WBC 18.3* 17.2* 17.1*  HGB 10.6* 10.3* 9.8*  HCT 33.0* 32.1* 30.8*  PLT 475* 432* 456*   BMET Recent Labs    04/06/20 0854 04/07/20 0911 04/08/20 0553  NA 130* 129* 130*  K 4.1 3.9 3.6  CL 99 95* 96*  CO2 23 25 24   GLUCOSE 120* 108* 93  BUN 13 15 15    CREATININE 0.79 0.77 0.77  CALCIUM 7.9* 7.9* 7.9*   Assessment: 74 year old male presenting to the ED 9/6 with abdominal pain, constipation, non-bleeding emesis, admitted with sepsis and found to have Fusobacterium on blood cultures. Also with acute on chronic anemia with Hgb down to 6.9 during admission. Notably patient is chronically anticoagulated on Eliquis.  Eliquis has been on hold with last dose on 9/8.  He received 2 units PRBCs with appropriate response. CT findings most consistent with generalized slow transit/constipation with no bowel obstruction. He had numerous liquid bowel movements in ED and for approximately 48 hours thereafter. Both GI pathogen panel and Cdiff were negative.   Abdominal pain resolved after numerous bowel movements.  Patient reported history of chronic constipation without regular bowel regimen although patient is a limited historian.  He notes minimal upper abdominal pain this morning.  He is going to need a more dedicated bowel regimen for constipation as an outpatient.  Acute on chronic anemia: Occult GI bleed in the setting of Eliquis.  No overt GI bleeding.  Hemoglobin down as low as 9.9 on 9/9.  Received 2 units PRBCs with hemoglobin stable in the upper 9/10 range.  He was heme positive and has evidence of iron deficiency.  No prior EGD or colonoscopy. We had planned for EGD and colonoscopy today for further evaluation but patient did not complete colon prep and continued to pass formed stool this morning.  Per patient, the prep made him nauseated.  We will try  a MiraLAX prep today.  Patient is agreeable.  Discussed with nursing staff and contacted endoscopy to have procedure canceled for today. Hopefully we can reschedule tomorrow.   Plan: Clear liquids today.  N.p.o. at midnight. MiraLAX prep today. Continue to hold Eliquis. Continue PPI daily. Monitor H/H and for overt GI bleeding.  We will need to reassess in the morning regarding colon prep prior placing  patient back on schedule.    LOS: 7 days    04/08/2020, 8:46 AM   Aliene Altes, PA-C [facility] Gastroenterology

## 2020-04-08 NOTE — Progress Notes (Signed)
PROGRESS NOTE    Mariusz Jubb Rollo  JME:[id number] DOB: 1946-02-10 DOA: 03/31/2020 PCP: Patient, No Pcp Per   Brief Narrative:  Per HPI: [name] a 74 y.o.malewith medical history significant ofDVT, GERD, hypertension, hyperlipidemia, PTSD, chronic renal insufficiency, history of seizures (last one was in 2004) who is coming to the emergency department due toprogressively worse abdominal pain associated with constipation, decreased oral intake, fatigue, malaise and postural dizziness for the past 3 days. He also had 2 episodes of nonbloody emesis in the past 24 hours. He denies melena or hematochezia. No dysuria, frequency or hematuria. However, he states that he has been urinating less than usual. He denies fever, chills, night sweats, rhinorrhea, sore throat, dyspnea, wheezing or hemoptysis. No chest pain, palpitations, diaphoresis, PND, orthopnea or recent pitting edema of the lower extremities. He denies polyuria, polydipsia, polyphagia or blurred vision.  9/8-9/14: Patient was initially admitted with sepsis of unknown cause and was noted to have some abdominal pain and diarrhea which have now resolved.  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  He was noted to have an episode of acute anemia as well requiring 2 unit PRBC transfusion and currently remained stable with no overt bleeding, however he was noted to be positive for Hemoccult and is noted to be on Eliquis at home for prior history of DVT.  This has currently been held and GI is anticipating upper and lower endoscopy tomorrow once repeat colon prep has been performed.  Assessment & Plan:   Principal Problem:   Sepsis due to undetermined organism [facility]) Active Problems:   Seizure ([place])   HTN (hypertension)   GERD (gastroesophageal reflux disease)   Hyponatremia   Lactic acidosis   AKI (acute kidney injury) ([place])   Normocytic anemia   Thrombocytosis (H[facility])    Hyperlipidemia   Hyperglycemia   Heme positive stool   Sepsis on admissionwith Fusobacterium bacteremia -Patient now with diarrheaand C. difficile testing negative. GI panelnegative -Urine cultures with insignificant growth -Continue current antibiotics, now with Unasyn -Plan for total 2-week antibiotic treatment to complete course with p.o. antibiotics when ready for dc, likely Flagyl  Acute anemia-improved status post 2 unit PRBC transfusion on 9/9; stable -Stool occultpositive -Iron deficiency noted on anemia panel, but will hold off on iron transfusion given acute infection -Appreciate GI consultation with plans for EGD and colonoscopy on 9/15 after repeat prep, currently on clear liquid diet -Plan to keep n.p.o. 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  Seizure history -Continue oxycarbamazepine -Continue Sinemet  Hypertension -Hold lisinopril and start metoprolol -Labetalol prn  GERD -PPI  Significant leukocytosis likelyleukemoid reaction -Appreciate hematology evaluation; can follow up outpatient at this point -Continue current antibiotics as ordered  Dyslipidemia -Resume home statin  History of prior DVT -Hold Eliquis for now until endoscopy -Plan to resume on dc pending endoscopic evaluation  Mild hypomagnesemia -Replete IV -Reevaluate in a.m.   DVT prophylaxis:SCDs Code Status:Full Family Communication:Discussed withwife 9/14 Disposition Plan: Status is: Inpatient  Remains inpatient appropriate because:IV treatments appropriate due to  intensity of illness or inability to take PO and Inpatient level of care appropriate due to severity of illness  Dispo: The patient is from:Home Anticipated d/c is to:SNF Anticipated d/c date is:1-2days Patient currently is not medically stable to d/c. He will require endoscopy while inpatient on account of his acute anemia with positive Hemoccult.  Consultants:  Heme/Onc  GI  Procedures:  See below  Antimicrobials:  Anti-infectives (From admission, onward)   Start     Dose/Rate Route Frequency Ordered Stop   04/04/20 1800  Ampicillin-Sulbactam (UNASYN) 3 g in sodium chloride 0.9 % 100 mL IVPB        3 g 200 mL/hr over 30 Minutes Intravenous Every 6 hours 04/04/20 1420     04/03/20 1000  ceFEPIme (MAXIPIME) 2 g in sodium chloride 0.9 % 100 mL IVPB  Status:  Discontinued        2 g 200 mL/hr over 30 Minutes Intravenous Every 8 hours 04/03/20 0909 04/04/20 1420   04/01/20 0830  vancomycin (VANCOREADY) IVPB 750 mg/150 mL  Status:  Discontinued        750 mg 150 mL/hr over 60 Minutes Intravenous Every 12 hours 03/31/20 2024 04/01/20 1004   04/01/20 0400  metroNIDAZOLE (FLAGYL) IVPB 500 mg  Status:  Discontinued        500 mg 100 mL/hr over 60 Minutes Intravenous Every 8 hours 04/01/20 0026 04/04/20 1420   03/31/20 2030  ceFEPIme (MAXIPIME) 2 g in sodium chloride 0.9 % 100 mL IVPB  Status:  Discontinued        2 g 200 mL/hr over 30 Minutes Intravenous Every 12 hours 03/31/20 2024 04/03/20 0909   03/31/20 2015  vancomycin (VANCOREADY) IVPB 2000 mg/400 mL        2,000 mg 200 mL/hr over 120 Minutes Intravenous  Once 03/31/20 2002 04/01/20 0027   03/31/20 2000  ceFEPIme (MAXIPIME) 2 g in sodium chloride 0.9 % 100 mL IVPB  Status:  Discontinued        2 g 200 mL/hr over 30 Minutes Intravenous  Once 03/31/20 1954 03/31/20 2024   03/31/20 2000  metroNIDAZOLE (FLAGYL) IVPB 500 mg        500 mg 100 mL/hr over 60 Minutes Intravenous   Once 03/31/20 1954 03/31/20 2159   03/31/20 2000  vancomycin (VANCOCIN) IVPB 1000 mg/200 mL premix  Status:  Discontinued        1,000 mg 200 mL/hr over 60 Minutes Intravenous  Once 03/31/20 1954 03/31/20 2002       Subjective: Patient seen and evaluated today with no new acute complaints or concerns. No acute concerns or events noted overnight.  Objective: Vitals:   04/07/20 0919 04/07/20 1500 04/07/20 2113 04/08/20 0545  BP: 127/77 129/84 (!) 162/98 (!) 143/91  Pulse: 97 95 95 (!) 106  Resp:  17 18 20   Temp:  98.2 F (36.8 C) 98.5 F (36.9 C) 98.5 F (36.9 C)  TempSrc:  Oral Oral Oral  SpO2:  96% 98% 95%  Weight:      Height:        Intake/Output Summary (Last 24 hours) at 04/08/2020 1117 Last data filed at 04/08/2020 0531 Gross per 24 hour  Intake 800 ml  Output 1600 ml  Net -800 ml   Filed Weights   04/01/20 0206 04/02/20 0500 04/02/20 1930  Weight: 86 kg 86 kg 86.4 kg    Examination:  General exam: Appears calm and comfortable  Respiratory system: Clear to auscultation. Respiratory effort normal. Cardiovascular system: S1 & S2 heard, RRR.  Gastrointestinal system: Abdomen is nondistended, soft and nontender. Central nervous system: Alert and  oriented. No focal neurological deficits. Extremities: Symmetric 5 x 5 power. Skin: No rashes, lesions or ulcers Psychiatry: Flat affect.    Data Reviewed: I have personally reviewed following labs and imaging studies  CBC: Recent Labs  Lab 04/03/20 0604 04/03/20 2122 04/04/20 0417 04/05/20 0756 04/06/20 0854 04/07/20 0911 04/08/20 0553  WBC 23.4*  --  18.7* 19.2* 18.3* 17.2* 17.1*  NEUTROABS 20.2*  --  15.3* 15.9* 14.3* 13.7*  --   HGB 6.9*   < > 9.1* 10.1* 10.6* 10.3* 9.8*  HCT 21.6*   < > 28.3* 31.2* 33.0* 32.1* 30.8*  MCV 83.7  --  84.5 84.8 84.4 84.9 85.8  PLT 442*  --  431* 456* 475* 432* 456*   < > = values in this interval not displayed.   Basic Metabolic Panel: Recent Labs  Lab 04/04/20 0417  04/05/20 0756 04/06/20 0854 04/07/20 0911 04/08/20 0553  NA 131* 134* 130* 129* 130*  K 4.0 4.3 4.1 3.9 3.6  CL 104 101 99 95* 96*  CO2 19* 23 23 25 24   GLUCOSE 74 94 120* 108* 93  BUN 17 13 13 15 15   CREATININE 0.82 0.87 0.79 0.77 0.77  CALCIUM 7.8* 8.2* 7.9* 7.9* 7.9*  MG 1.5* 1.5* 1.6* 1.7 1.6*   GFR: Estimated Creatinine Clearance: 85 mL/min (by C-G formula based on SCr of 0.77 mg/dL). Liver Function Tests: Recent Labs  Lab 04/02/20 0531 04/03/20 0604  AST 21 20  ALT 15 11  ALKPHOS 60 55  BILITOT 0.8 0.5  PROT 5.8* 5.5*  ALBUMIN 2.0* 1.9*   No results for input(s): LIPASE, AMYLASE in the last 168 hours. No results for input(s): AMMONIA in the last 168 hours. Coagulation Profile: No results for input(s): INR, PROTIME in the last 168 hours. Cardiac Enzymes: No results for input(s): CKTOTAL, CKMB, CKMBINDEX, TROPONINI in the last 168 hours. BNP (last 3 results) No results for input(s): PROBNP in the last 8760 hours. HbA1C: No results for input(s): HGBA1C in the last 72 hours. CBG: No results for input(s): GLUCAP in the last 168 hours. Lipid Profile: No results for input(s): CHOL, HDL, LDLCALC, TRIG, CHOLHDL, LDLDIRECT in the last 72 hours. Thyroid Function Tests: No results for input(s): TSH, T4TOTAL, FREET4, T3FREE, THYROIDAB in the last 72 hours. Anemia Panel: No results for input(s): VITAMINB12, FOLATE, FERRITIN, TIBC, IRON, RETICCTPCT in the last 72 hours. Sepsis Labs: Recent Labs  Lab 04/03/20 0604 04/04/20 0417  PROCALCITON  --  10.69  LATICACIDVEN 0.6  --     Recent Results (from the past 240 hour(s))  Blood culture (routine single)     Status: Abnormal   Collection Time: 03/31/20  7:00 PM   Specimen: Left Antecubital; Blood  Result Value Ref Range Status   Specimen Description   Final    LEFT ANTECUBITAL Performed at [facility], [address], [place], [place] [address]    Special Requests   Final    BOTTLES DRAWN AEROBIC AND ANAEROBIC  Blood Culture adequate volume Performed at [address] [place], [place] [address]    Culture  Setup Time   Final    GRAM NEGATIVE RODS ANAEROBIC BOTTLE Gram Stain Report Called to,Read Back By and Verified With: MURPHY,[id number]  BY MATTHEWS, B 9.7.21 [facility] GRAM STAIN REVIEWED-AGREE WITH RESULT    Culture (A)  Final    FUSOBACTERIUM NUCLEATUM BETA LACTAMASE NEGATIVE Performed at [facility], [place] [address] [place],  [address]    Report Status  04/04/2020 FINAL  Final  SARS Coronavirus 2 by RT PCR (hospital order, performed in [facility] hospital lab) Nasopharyngeal Nasopharyngeal Swab     Status: None   Collection Time: 03/31/20  7:00 PM   Specimen: Nasopharyngeal Swab  Result Value Ref Range Status   SARS Coronavirus 2 NEGATIVE NEGATIVE Final    Comment: (NOTE) SARS-CoV-2 target nucleic acids are NOT DETECTED.  The SARS-CoV-2 RNA is generally detectable in upper and lower respiratory specimens during the acute phase of infection. The lowest concentration of SARS-CoV-2 viral copies this assay can detect is 250 copies / mL. A negative result does not preclude SARS-CoV-2 infection and should not be used as the sole basis for treatment or other patient management decisions.  A negative result may occur with improper specimen collection / handling, submission of specimen other than nasopharyngeal swab, presence of viral mutation(s) within the areas targeted by this assay, and inadequate number of viral copies (<250 copies / mL). A negative result must be combined with clinical observations, patient history, and epidemiological information.  Fact Sheet for Patients:   StrictlyIdeas.no  Fact Sheet for Healthcare Providers: BankingDealers.co.za  This test is not yet approved or  cleared by the [place] FDA and has been authorized for detection and/or diagnosis of SARS-CoV-2 by FDA under an  Emergency Use Authorization (EUA).  This EUA will remain in effect (meaning this test can be used) for the duration of the COVID-19 declaration under Section 564(b)(1) of the Act, 21 U.S.C. section 360bbb-3(b)(1), unless the authorization is terminated or revoked sooner.  Performed at [facility], [address] [place], [place] [address]   Blood Culture ID Panel (Reflexed)     Status: None   Collection Time: 03/31/20  7:00 PM  Result Value Ref Range Status   Enterococcus faecalis NOT DETECTED NOT DETECTED Final   Enterococcus Faecium NOT DETECTED NOT DETECTED Final   Listeria monocytogenes NOT DETECTED NOT DETECTED Final   Staphylococcus species NOT DETECTED NOT DETECTED Final   Staphylococcus aureus (BCID) NOT DETECTED NOT DETECTED Final   Staphylococcus epidermidis NOT DETECTED NOT DETECTED Final   Staphylococcus lugdunensis NOT DETECTED NOT DETECTED Final   Streptococcus species NOT DETECTED NOT DETECTED Final   Streptococcus agalactiae NOT DETECTED NOT DETECTED Final   Streptococcus pneumoniae NOT DETECTED NOT DETECTED Final   Streptococcus pyogenes NOT DETECTED NOT DETECTED Final   A.calcoaceticus-baumannii NOT DETECTED NOT DETECTED Final   Bacteroides fragilis NOT DETECTED NOT DETECTED Final   Enterobacterales NOT DETECTED NOT DETECTED Final   Enterobacter cloacae complex NOT DETECTED NOT DETECTED Final   Escherichia coli NOT DETECTED NOT DETECTED Final   Klebsiella aerogenes NOT DETECTED NOT DETECTED Final   Klebsiella oxytoca NOT DETECTED NOT DETECTED Final   Klebsiella pneumoniae NOT DETECTED NOT DETECTED Final   Proteus species NOT DETECTED NOT DETECTED Final   Salmonella species NOT DETECTED NOT DETECTED Final   Serratia marcescens NOT DETECTED NOT DETECTED Final   Haemophilus influenzae NOT DETECTED NOT DETECTED Final   Neisseria meningitidis NOT DETECTED NOT DETECTED Final   Pseudomonas aeruginosa NOT DETECTED NOT DETECTED Final   Stenotrophomonas maltophilia NOT  DETECTED NOT DETECTED Final   Candida albicans NOT DETECTED NOT DETECTED Final   Candida auris NOT DETECTED NOT DETECTED Final   Candida glabrata NOT DETECTED NOT DETECTED Final   Candida krusei NOT DETECTED NOT DETECTED Final   Candida parapsilosis NOT DETECTED NOT DETECTED Final   Candida tropicalis NOT DETECTED NOT DETECTED Final   Cryptococcus neoformans/gattii NOT  DETECTED NOT DETECTED Final    Comment: Performed at [facility], [place] [address], [place], [place] [address]  Culture, blood (single)     Status: None   Collection Time: 03/31/20  8:55 PM   Specimen: BLOOD  Result Value Ref Range Status   Specimen Description BLOOD RIGHT ANTECUBITAL  Final   Special Requests   Final    BOTTLES DRAWN AEROBIC AND ANAEROBIC Blood Culture adequate volume   Culture   Final    NO GROWTH 5 DAYS Performed at [facility], [address], [place], [place] [address]    Report Status 04/05/2020 FINAL  Final  Urine culture     Status: Abnormal   Collection Time: 04/01/20 12:44 AM   Specimen: In/Out Cath Urine  Result Value Ref Range Status   Specimen Description   Final    IN/OUT CATH URINE Performed at [facility], [address], [place], [place] [address]    Special Requests   Final    NONE Performed at [facility], [address] [place], [place] [address]    Culture (A)  Final    <10,000 COLONIES/mL INSIGNIFICANT GROWTH Performed at [facility], [place] [address], [place], [place] [address]    Report Status 04/02/2020 FINAL  Final  MRSA PCR Screening     Status: None   Collection Time: 04/01/20  1:54 AM   Specimen: Nasopharyngeal  Result Value Ref Range Status   MRSA by PCR NEGATIVE NEGATIVE Final    Comment:        The GeneXpert MRSA Assay (FDA approved for NASAL specimens only), is one component of a comprehensive MRSA colonization surveillance program. It is not intended to diagnose MRSA infection nor to guide or monitor treatment for MRSA  infections. Performed at [facility], [address], [place], [place] [address]   C Difficile Quick Screen w PCR reflex     Status: None   Collection Time: 04/02/20  1:14 PM   Specimen: Stool  Result Value Ref Range Status   C Diff antigen NEGATIVE NEGATIVE Final   C Diff toxin NEGATIVE NEGATIVE Final   C Diff interpretation No C. difficile detected.  Final    Comment: Performed at [facility], [address] [place], [place] [address]  Gastrointestinal Panel by PCR , Stool     Status: None   Collection Time: 04/02/20  1:14 PM   Specimen: Stool  Result Value Ref Range Status   Campylobacter species NOT DETECTED NOT DETECTED Final   Plesimonas shigelloides NOT DETECTED NOT DETECTED Final   Salmonella species NOT DETECTED NOT DETECTED Final   Yersinia enterocolitica NOT DETECTED NOT DETECTED Final   Vibrio species NOT DETECTED NOT DETECTED Final   Vibrio cholerae NOT DETECTED NOT DETECTED Final   Enteroaggregative E coli (EAEC) NOT DETECTED NOT DETECTED Final   Enteropathogenic E coli (EPEC) NOT DETECTED NOT DETECTED Final   Enterotoxigenic E coli (ETEC) NOT DETECTED NOT DETECTED Final   Shiga like toxin producing E coli (STEC) NOT DETECTED NOT DETECTED Final   Shigella/Enteroinvasive E coli (EIEC) NOT DETECTED NOT DETECTED Final   Cryptosporidium NOT DETECTED NOT DETECTED Final   Cyclospora cayetanensis NOT DETECTED NOT DETECTED Final   Entamoeba histolytica NOT DETECTED NOT DETECTED Final   Giardia lamblia NOT DETECTED NOT DETECTED Final   Adenovirus F40/41 NOT DETECTED NOT DETECTED Final   Astrovirus NOT DETECTED NOT DETECTED Final   Norovirus GI/GII NOT DETECTED NOT DETECTED Final   Rotavirus A  NOT DETECTED NOT DETECTED Final   Sapovirus (I, II, IV, and V) NOT DETECTED NOT DETECTED Final    Comment: Performed at [facility], [address], [place], [place] [address]         Radiology Studies: No results found.      Scheduled Meds: . sodium chloride    Intravenous Once  . ARIPiprazole  2 mg Oral QHS  . carbidopa-levodopa  1 tablet Oral BID  . Chlorhexidine Gluconate Cloth  6 each Topical Daily  . feeding supplement (ENSURE ENLIVE)  237 mL Oral TID BM  . loratadine  10 mg Oral Daily  . metoprolol tartrate  50 mg Oral BID  . OXcarbazepine  150 mg Oral BID  . pantoprazole (PROTONIX) IV  40 mg Intravenous Q24H  . polyethylene glycol  255 g Oral Once  . potassium chloride  40 mEq Oral Once  . simvastatin  20 mg Oral QHS  . venlafaxine XR  150 mg Oral QHS   Continuous Infusions: . ampicillin-sulbactam (UNASYN) IV 3 g (04/08/20 0541)  . magnesium sulfate bolus IVPB 2 g (04/08/20 1019)     LOS: 7 days    Time spent: 30 minutes    Nitzia Perren Darleen Crocker, DO [facility]  If 7PM-7AM, please contact night-coverage [email or web address] 04/08/2020, 11:17 AM

## 2020-04-08 NOTE — Progress Notes (Signed)
Pt still sipping on first cup of miralax prep. Pt requiring multiple, frequent reminders to drink but only taking small sips each time.

## 2020-04-08 NOTE — Progress Notes (Signed)
Pt has had two large loose stools incontinently since lunchtime. Pt cleaned and linens changed. Pt continues to sip on Miralax prep. Aliene Altes, PA called to check on pt progress and updated on current status. Verbal order received to administer up to 6 additional doses of Miralax once initial 64 ounce prep ordered this am completed if pt's stools are not clear.

## 2020-04-08 NOTE — Progress Notes (Signed)
Pt has had an additional 2 loose BM's, still with flakes/pieces of stool noted, not clear but less solid stool than earlier. Pt continues to slowly drink bowel prep, requiring frequent reminders and encouragement.

## 2020-04-08 NOTE — Progress Notes (Signed)
Checked on patient. He had just completed his second cup of MiraLAX prep when I saw him. No BM as of yet. Reinforced the importance of drinking each cup he gets within 15-30 minutes and drinking addition liquid between each cup of prep as tolerated. Also reminded him that he can ask for Zofran if needed.   Wife at bedside. His wife notes that his mentation does seem different than his baseline. States he is usually very sharp and quick to respond. States she spoke with Dr. Manuella Ghazi about this earlier. Will defer to hospitalitis. I will also let Dr. Manuella Ghazi know wife's concerns as I am not entirely sure if wife spoke with Dr. Manuella Ghazi or not.

## 2020-04-09 ENCOUNTER — Encounter (HOSPITAL_COMMUNITY)
Admission: EM | Disposition: A | Discharge status: Skilled Nursing Facility | Payer: Self-pay | Source: Home / Self Care | Attending: Internal Medicine

## 2020-04-09 ENCOUNTER — Other Ambulatory Visit: Payer: Self-pay

## 2020-04-09 DIAGNOSIS — E872 Acidosis: Secondary | ICD-10-CM

## 2020-04-09 DIAGNOSIS — R569 Unspecified convulsions: Secondary | ICD-10-CM

## 2020-04-09 DIAGNOSIS — K633 Ulcer of intestine: Secondary | ICD-10-CM

## 2020-04-09 DIAGNOSIS — R195 Other fecal abnormalities: Secondary | ICD-10-CM

## 2020-04-09 DIAGNOSIS — E871 Hypo-osmolality and hyponatremia: Secondary | ICD-10-CM

## 2020-04-09 DIAGNOSIS — D649 Anemia, unspecified: Secondary | ICD-10-CM

## 2020-04-09 DIAGNOSIS — I10 Essential (primary) hypertension: Secondary | ICD-10-CM

## 2020-04-09 DIAGNOSIS — E785 Hyperlipidemia, unspecified: Secondary | ICD-10-CM

## 2020-04-09 HISTORY — PX: COLONOSCOPY: SHX5424

## 2020-04-09 HISTORY — PX: ESOPHAGOGASTRODUODENOSCOPY: SHX5428

## 2020-04-09 HISTORY — PX: BIOPSY: SHX5522

## 2020-04-09 LAB — CBC
HCT: 31.4 % — ABNORMAL LOW (ref 39.0–52.0)
Hemoglobin: 9.9 g/dL — ABNORMAL LOW (ref 13.0–17.0)
MCH: 26.9 pg (ref 26.0–34.0)
MCHC: 31.5 g/dL (ref 30.0–36.0)
MCV: 85.3 fL (ref 80.0–100.0)
Platelets: 466 10*3/uL — ABNORMAL HIGH (ref 150–400)
RBC: 3.68 MIL/uL — ABNORMAL LOW (ref 4.22–5.81)
RDW: 16.4 % — ABNORMAL HIGH (ref 11.5–15.5)
WBC: 14.4 10*3/uL — ABNORMAL HIGH (ref 4.0–10.5)
nRBC: 0 % (ref 0.0–0.2)

## 2020-04-09 LAB — BASIC METABOLIC PANEL
Anion gap: 12 (ref 5–15)
BUN: 14 mg/dL (ref 8–23)
CO2: 22 mmol/L (ref 22–32)
Calcium: 8 mg/dL — ABNORMAL LOW (ref 8.9–10.3)
Chloride: 95 mmol/L — ABNORMAL LOW (ref 98–111)
Creatinine, Ser: 0.72 mg/dL (ref 0.61–1.24)
GFR calc Af Amer: >60 mL/min (ref >60)
GFR calc non Af Amer: >60 mL/min (ref >60)
Glucose, Bld: 83 mg/dL (ref 70–99)
Potassium: 3.8 mmol/L (ref 3.5–5.1)
Sodium: 129 mmol/L — ABNORMAL LOW (ref 135–145)

## 2020-04-09 LAB — MAGNESIUM: Magnesium: 1.7 mg/dL (ref 1.7–2.4)

## 2020-04-09 SURGERY — COLONOSCOPY
Anesthesia: Moderate Sedation

## 2020-04-09 MED ORDER — SODIUM CHLORIDE FLUSH 0.9 % IV SOLN
INTRAVENOUS | Status: AC
  Filled 2020-04-09: qty 10

## 2020-04-09 MED ORDER — MEPERIDINE HCL 100 MG/ML IJ SOLN
INTRAMUSCULAR | Status: DC | PRN
Start: 2020-04-09 — End: 2020-04-09
  Administered 2020-04-09: 15 mg via INTRAVENOUS
  Administered 2020-04-09: 25 mg via INTRAVENOUS
  Administered 2020-04-09: 10 mg via INTRAVENOUS

## 2020-04-09 MED ORDER — ONDANSETRON HCL 4 MG/2ML IJ SOLN
INTRAMUSCULAR | Status: AC
  Filled 2020-04-09: qty 2

## 2020-04-09 MED ORDER — STERILE WATER FOR IRRIGATION IR SOLN
Status: DC | PRN
  Administered 2020-04-09: 1.5 mL

## 2020-04-09 MED ORDER — MIDAZOLAM HCL 5 MG/5ML IJ SOLN
INTRAMUSCULAR | Status: DC | PRN
  Administered 2020-04-09 (×4): 1 mg via INTRAVENOUS

## 2020-04-09 MED ORDER — LIDOCAINE VISCOUS HCL 2 % MT SOLN
OROMUCOSAL | Status: AC
  Filled 2020-04-09: qty 15

## 2020-04-09 MED ORDER — MEPERIDINE HCL 50 MG/ML IJ SOLN
INTRAMUSCULAR | Status: AC
Start: 2020-04-09 — End: 2020-04-10
  Filled 2020-04-09: qty 1

## 2020-04-09 MED ORDER — MIDAZOLAM HCL 5 MG/5ML IJ SOLN
INTRAMUSCULAR | Status: AC
  Filled 2020-04-09: qty 10

## 2020-04-09 MED ORDER — ONDANSETRON HCL 4 MG/2ML IJ SOLN
INTRAMUSCULAR | Status: DC | PRN
  Administered 2020-04-09: 4 mg via INTRAVENOUS

## 2020-04-09 MED ORDER — LIDOCAINE VISCOUS HCL 2 % MT SOLN
OROMUCOSAL | Status: DC | PRN
  Administered 2020-04-09: 5 mL via OROMUCOSAL

## 2020-04-09 MED ORDER — SODIUM CHLORIDE 0.9 % IV SOLN: INTRAVENOUS | Status: DC

## 2020-04-09 NOTE — Progress Notes (Signed)
Pt has been drowsy this afternoon since returning from Endo. Alert, oriented x4 and denies c/o.  Supper tray delivered and set up for pt. Pt unable to feed self, attempted to pick up spoon but seemed confused about how to use utensil. When questioned, pt can state that its a spoon and that he wants to eat mashed potatoes with it but he's just tired and feels "weak". NT in and fed patient. Pt ate well, no difficulty swallowing.

## 2020-04-09 NOTE — Progress Notes (Signed)
Pt returned to room via stretcher from Endo. VSS, pt alert and oriented, denies c/o.

## 2020-04-09 NOTE — Op Note (Signed)
[facility] Patient Name: Nathan [name] Procedure Date: 04/09/2020 1:43 PM MRN: [id number] Date of Birth: 08-17-1945 Attending MD: Norvel Richards , MD CSN: [id number] Age: 74 Admit Type: Inpatient Procedure:                Upper GI endoscopy Indications:              Heme positive stool Providers:                Norvel Richards, MD, Otis Peak B. Sharon Seller, RN,                            Caprice Kluver, Rosina Lowenstein, RN Referring MD:              Medicines:                Midazolam 3 mg IV, Meperidine 50 mg IV Complications:            No immediate complications. Estimated Blood Loss:     Estimated blood loss: none. Procedure:                Pre-Anesthesia Assessment:                           - Prior to the procedure, a History and Physical                            was performed, and patient medications and                            allergies were reviewed. The patient's tolerance of                            previous anesthesia was also reviewed. The risks                            and benefits of the procedure and the sedation                            options and risks were discussed with the patient.                            All questions were answered, and informed consent                            was obtained. Prior Anticoagulants: The patient                            last took Eliquis (apixaban) 7 days prior to the                            procedure. ASA Grade Assessment: III - A patient                            with severe systemic disease. After reviewing the  risks and benefits, the patient was deemed in                            satisfactory condition to undergo the procedure.                           After obtaining informed consent, the endoscope was                            passed under direct vision. Throughout the                            procedure, the patient's blood pressure, pulse, and                             oxygen saturations were monitored continuously. The                            [id number]-[id number] ([id number]) scope was introduced through the                            mouth, and advanced to the second part of duodenum.                            The upper GI endoscopy was accomplished without                            difficulty. The patient tolerated the procedure                            well. Scope In: 1:59:29 PM Scope Out: 2:02:45 PM Total Procedure Duration: 0 hours 3 minutes 16 seconds  Findings:      The examined esophagus was normal.      A small hiatal hernia was present.      The duodenal bulb and second portion of the duodenum were normal. Impression:               - Normal esophagus.                           - Small hiatal hernia.                           - Normal duodenal bulb and second portion of the                            duodenum.                           - No specimens collected. Moderate Sedation:      Moderate (conscious) sedation was administered by the endoscopy nurse       and supervised by the endoscopist. The following parameters were       monitored: oxygen saturation, heart rate, blood pressure, respiratory       rate, EKG, adequacy of pulmonary ventilation, and response to care.  Total physician intraservice time was 9 minutes. Recommendation:           - Patient has a contact number available for                            emergencies. The signs and symptoms of potential                            delayed complications were discussed with the                            patient. Return to normal activities tomorrow.                            Written discharge instructions were provided to the                            patient.                           - Continue present medications.                           - Return patient to hospital ward for ongoing care.                           - Advance diet as tolerated.                           -  Continue present medications. See colonoscopy                            report Procedure Code(s):        --- Professional ---                           [phone], Esophagogastroduodenoscopy, flexible,                            transoral; diagnostic, including collection of                            specimen(s) by brushing or washing, when performed                            (separate procedure) Diagnosis Code(s):        --- Professional ---                           K44.9, Diaphragmatic hernia without obstruction or                            gangrene                           R19.5, Other fecal abnormalities CPT copyright 2019 [employer]. All rights reserved. The codes documented in this report are preliminary  and upon coder review may  be revised to meet current compliance requirements. Cristopher Estimable. Jailee Jaquez, MD Norvel Richards, MD 04/09/2020 2:25:38 PM This report has been signed electronically. Number of Addenda: 0

## 2020-04-09 NOTE — Progress Notes (Signed)
Pt has received total of 2 tap water enemas. Pt had clear yellow liquid results with mucous only noted. Pt tolerated well. Pt cleaned, linens changed. MD notified of enema results.

## 2020-04-09 NOTE — Progress Notes (Signed)
Physical Therapy Treatment Patient Details Name: Nathan [name] MRN: [id number] DOB: February 07, 1946 Today's Date: 04/09/2020    History of Present Illness Nathan [name] is a 74 y.o. male with medical history significant of DVT, GERD, hypertension, hyperlipidemia, PTSD, chronic renal insufficiency, history of seizures (last one was in 2004) who is coming to the emergency department due to progressively worse abdominal pain associated with constipation, decreased oral intake, fatigue, malaise and postural dizziness for the past 3 days.  He also had 2 episodes of nonbloody emesis in the past 24 hours.   He denies melena or hematochezia.  No dysuria, frequency or hematuria.  However, he states that he has been urinating less than usual.  He denies fever, chills, night sweats, rhinorrhea, sore throat, dyspnea, wheezing or hemoptysis.  No chest pain, palpitations, diaphoresis, PND, orthopnea or recent pitting edema of the lower extremities.  He denies polyuria, polydipsia, polyphagia or blurred vision.    PT Comments    Patient demonstrates slow labored movement for sitting up at bedside, c/o discomfort when moving BLE during bed mobility, unable to fully lock knees or take steps when standing due to BLE weakness and put back to bed to have enema after therapy - RN present in room.  Patient will benefit from continued physical therapy in hospital and recommended venue below to increase strength, balance, endurance for safe ADLs and gait.    Follow Up Recommendations  SNF;Supervision/Assistance - 24 hour;Supervision for mobility/OOB     Equipment Recommendations  None recommended by PT    Recommendations for Other Services       Precautions / Restrictions Precautions Precautions: Fall Restrictions Weight Bearing Restrictions: No    Mobility  Bed Mobility Overal bed mobility: Needs Assistance Bed Mobility: Supine to Sit;Sit to Supine     Supine to sit: Mod assist;Max assist Sit to supine:  Mod assist   General bed mobility comments: slow labored movement  Transfers Overall transfer level: Needs assistance Equipment used: Rolling walker (2 wheeled) Transfers: Sit to/from Stand Sit to Stand: Max assist         General transfer comment: limited to standing for 20-30 seconds before having to sit due to weakness  Ambulation/Gait                 Stairs             Wheelchair Mobility    Modified Rankin (Stroke Patients Only)       Balance Overall balance assessment: Needs assistance Sitting-balance support: Feet supported;No upper extremity supported Sitting balance-Leahy Scale: Fair Sitting balance - Comments: seated EOB   Standing balance support: During functional activity;Bilateral upper extremity supported Standing balance-Leahy Scale: Poor Standing balance comment: using RW                            Cognition Arousal/Alertness: Awake/alert Behavior During Therapy: WFL for tasks assessed/performed Overall Cognitive Status: Within Functional Limits for tasks assessed                                        Exercises      General Comments        Pertinent Vitals/Pain Pain Assessment: Faces Faces Pain Scale: Hurts little more Pain Location: BLE with movement Pain Descriptors / Indicators: Sore;Grimacing;Guarding Pain Intervention(s): Limited activity within patient's tolerance;Monitored during session    Home  Living                      Prior Function            PT Goals (current goals can now be found in the care plan section) Acute Rehab PT Goals Patient Stated Goal: Return home PT Goal Formulation: With patient Time For Goal Achievement: 04/18/20 Potential to Achieve Goals: Fair Progress towards PT goals: Progressing toward goals    Frequency    Min 3X/week      PT Plan Current plan remains appropriate    Co-evaluation              AM-PAC PT "6 Clicks" Mobility    Outcome Measure  Help needed turning from your back to your side while in a flat bed without using bedrails?: A Lot Help needed moving from lying on your back to sitting on the side of a flat bed without using bedrails?: A Lot Help needed moving to and from a bed to a chair (including a wheelchair)?: Total Help needed standing up from a chair using your arms (e.g., wheelchair or bedside chair)?: A Lot Help needed to walk in hospital room?: Total Help needed climbing 3-5 steps with a railing? : Total 6 Click Score: 9    End of Session   Activity Tolerance: Patient tolerated treatment well;Patient limited by fatigue Patient left: in bed;with call bell/phone within reach;with nursing/sitter in room Nurse Communication: Mobility status PT Visit Diagnosis: Unsteadiness on feet (R26.81);Other abnormalities of gait and mobility (R26.89);Muscle weakness (generalized) (M62.81)     Time: 1761-6073 PT Time Calculation (min) (ACUTE ONLY): 23 min  Charges:  $Therapeutic Activity: 23-37 mins                     10:45 AM, 04/09/20 Lonell Grandchild, MPT Physical Therapist with [facility] [phone] office [phone] mobile phone

## 2020-04-09 NOTE — Op Note (Signed)
[facility] Patient Name: Nathan [name] Procedure Date: 04/09/2020 1:44 PM MRN: [id number] Date of Birth: 1946-05-17 Attending MD: Nathan [name] , MD CSN: [id number] Age: 74 Admit Type: Inpatient Procedure:                Colonoscopy Indications:              Heme positive stool Providers:                Nathan Richards, MD, Gwenlyn Fudge RN, RN,                            Caprice Kluver, Rosina Lowenstein, RN Referring MD:              Medicines:                Midazolam 5 mg IV, Meperidine 40 mg IV Complications:            No immediate complications. Estimated Blood Loss:     Estimated blood loss was minimal. Procedure:                Pre-Anesthesia Assessment:                           - Prior to the procedure, a History and Physical                            was performed, and patient medications and                            allergies were reviewed. The patient's tolerance of                            previous anesthesia was also reviewed. The risks                            and benefits of the procedure and the sedation                            options and risks were discussed with the patient.                            All questions were answered, and informed consent                            was obtained. Prior Anticoagulants: The patient                            last took Eliquis (apixaban) 7 days prior to the                            procedure. ASA Grade Assessment: III - A patient                            with severe systemic disease. After reviewing the  risks and benefits, the patient was deemed in                            satisfactory condition to undergo the procedure.                           After obtaining informed consent, the colonoscope                            was passed under direct vision. Throughout the                            procedure, the patient's blood pressure, pulse, and                             oxygen saturations were monitored continuously. The                            [id number]-[id number] ([id number]) scope was introduced through                            the anus and advanced to the the cecum, identified                            by appendiceal orifice and ileocecal valve. The                            ileocecal valve, appendiceal orifice, and rectum                            were photographed. Scope In: 2:08:54 PM Scope Out: 2:23:44 PM Scope Withdrawal Time: 0 hours 8 minutes 21 seconds  Total Procedure Duration: 0 hours 14 minutes 50 seconds  Findings:      The perianal and digital rectal examinations were normal.      Scattered medium-mouthed diverticula were found in the sigmoid colon and       descending colon.      Marked inflammatory changes involving a segment of the left colon       beginning in the area of the splenic flexure down to the junction of       descending/sigmoid. "Geographic" ulceration of a good 20-25 segment with       associated erosions and fibrotic appearing mucosa. Colonic mucosa       upstream of this segment and below it appeared normal aside from the       above-mentioned diverticula; biopsies of the abnormal left colon taken       for histologic study Impression:               - Diverticulosis in the sigmoid colon and in the                            descending colon. Segmental colitis involving the                            descending colon most consistent with ischemic  or                            segmental colitis which is typically a                            self-limiting illness.                           -Status post biopsy Moderate Sedation:      Moderate (conscious) sedation was administered by the endoscopy nurse       and supervised by the endoscopist. The following parameters were       monitored: oxygen saturation, heart rate, blood pressure, respiratory       rate, EKG, adequacy of pulmonary ventilation, and response to care.        Total physician intraservice time was 32 minutes. Recommendation:           - Return patient to hospital ward for ongoing care.                           -Low residue cardiac diet.                           - Continue present medications. Follow-up on                            pathology.                           - No repeat colonoscopy due to age. At patient                            request, I called Nathan [name] at [phone] and                            reviewed findings and recommendations Procedure Code(s):        --- Professional ---                           [phone], Colonoscopy, flexible; diagnostic, including                            collection of specimen(s) by brushing or washing,                            when performed (separate procedure)                           [id number], Moderate sedation; each additional 15                            minutes intraservice time                           G0500, Moderate sedation services provided by the  same physician or other qualified health care                            professional performing a gastrointestinal                            endoscopic service that sedation supports,                            requiring the presence of an independent trained                            observer to assist in the monitoring of the                            patient's level of consciousness and physiological                            status; initial 15 minutes of intra-service time;                            patient age 13 years or older (additional time may                            be reported with [phone], as appropriate) Diagnosis Code(s):        --- Professional ---                           R19.5, Other fecal abnormalities                           K57.30, Diverticulosis of large intestine without                            perforation or abscess without bleeding CPT copyright 2019 American Medical  Association. All rights reserved. The codes documented in this report are preliminary and upon coder review may  be revised to meet current compliance requirements. Nathan [name]. Nathan Dalporto, MD Nathan Richards, MD 04/09/2020 2:36:07 PM This report has been signed electronically. Number of Addenda: 0

## 2020-04-09 NOTE — Progress Notes (Addendum)
Subjective:  Completed about 40 ounces of miralax. Tap water enema this morning with watery yellow return.   Objective: Vital signs in last 24 hours: Temp:  [97.9 F (36.6 C)-98.2 F (36.8 C)] 98.1 F (36.7 C) (09/15 0500) Pulse Rate:  [72-86] 86 (09/15 0500) Resp:  [18-20] 18 (09/15 0500) BP: (139-156)/(86-91) 155/87 (09/15 0500) SpO2:  [97 %-99 %] 97 % (09/15 0500) Last BM Date: 04/08/20 General:   Alert,  Well-developed, well-nourished, pleasant and cooperative in NAD Head:  Normocephalic and atraumatic. Eyes:  Sclera clear, no icterus.  Chest: CTA bilaterally without rales, rhonchi, crackles.    Heart:  Regular rate and rhythm; no murmurs, clicks, rubs,  or gallops. Abdomen:  Soft, nontender and nondistended. No masses, hepatosplenomegaly or hernias noted. Normal bowel sounds, without guarding, and without rebound.   Neurologic:  Alert and  oriented x4;  grossly normal neurologically. Skin:  Intact without significant lesions or rashes. Psych:  Alert and cooperative. Normal mood and affect.  Intake/Output from previous day: 09/14 0701 - 09/15 0700 In: 1097.2 [IV Piggyback:1097.2] Out: 650 [Urine:650] Intake/Output this shift: No intake/output data recorded.  Lab Results: CBC Recent Labs    04/07/20 0911 04/08/20 0553 04/09/20 0556  WBC 17.2* 17.1* 14.4*  HGB 10.3* 9.8* 9.9*  HCT 32.1* 30.8* 31.4*  MCV 84.9 85.8 85.3  PLT 432* 456* 466*   BMET Recent Labs    04/07/20 0911 04/08/20 0553 04/09/20 0556  NA 129* 130* 129*  K 3.9 3.6 3.8  CL 95* 96* 95*  CO2 25 24 22   GLUCOSE 108* 93 83  BUN 15 15 14   CREATININE 0.77 0.77 0.72  CALCIUM 7.9* 7.9* 8.0*   LFTs No results for input(s): BILITOT, BILIDIR, IBILI, ALKPHOS, AST, ALT, PROT, ALBUMIN in the last 72 hours. No results for input(s): LIPASE in the last 72 hours. PT/INR No results for input(s): LABPROT, INR in the last 72 hours.    Imaging Studies: CT Head Wo Contrast  Result Date:  03/31/2020 CLINICAL DATA:  Seizure EXAM: CT HEAD WITHOUT CONTRAST TECHNIQUE: Contiguous axial images were obtained from the base of the skull through the vertex without intravenous contrast. COMPARISON:  11/23/2019 FINDINGS: Brain: There is atrophy and chronic small vessel disease changes. No acute intracranial abnormality. Specifically, no hemorrhage, hydrocephalus, mass lesion, acute infarction, or significant intracranial injury. Vascular: No hyperdense vessel or unexpected calcification. Skull: No acute calvarial abnormality. Sinuses/Orbits: Visualized paranasal sinuses and mastoids clear. Orbital soft tissues unremarkable. Other: None IMPRESSION: Atrophy, chronic microvascular disease. No acute intracranial abnormality. Electronically Signed   By: Rolm Baptise M.D.   On: 03/31/2020 22:03   CT ABDOMEN PELVIS W CONTRAST  Result Date: 03/31/2020 CLINICAL DATA:  Bowel obstruction suspected. Constipation, vomiting and generalized abdominal pain. Syncopal episode. EXAM: CT ABDOMEN AND PELVIS WITH CONTRAST TECHNIQUE: Multidetector CT imaging of the abdomen and pelvis was performed using the standard protocol following bolus administration of intravenous contrast. CONTRAST:  20mL OMNIPAQUE IOHEXOL 300 MG/ML  SOLN COMPARISON:  Radiograph earlier this day. Abdominopelvic CT 11/22/2019 FINDINGS: Lower chest: No basilar consolidation or pleural effusion. There are coronary artery calcifications. Fluid distending the distal esophagus. Hepatobiliary: Unchanged low-density lesions in the right lobe of the liver. No new hepatic lesion. Gallbladder physiologically distended, no calcified stone. No biliary dilatation. Pancreas: Parenchymal atrophy. No ductal dilatation or inflammation. Spleen: Normal in size without focal abnormality. Adrenals/Urinary Tract: No adrenal nodule. No hydronephrosis. Tiny cortical hypodensity in the upper left mid lower right kidney too small to characterize but  likely small cysts. No significant  perinephric edema. No renal or ureteral calculi. There is absent renal excretion on delayed phase imaging. Nondistended but thick walled urinary bladder. Stomach/Bowel: Fluid distending the distal esophagus. Fluid distending the stomach. No gastric wall thickening. Small bowel loops are diffusely fluid-filled but no transition point. There is fecalization of small bowel contents involving the distal ileum. No definite wall thickening or perienteric edema. Normal appendix. Mild colonic distension with liquid and solid stool. There is formed stool in the sigmoid colon. Stool distends the rectum with rectal distention of 5.1 cm. No perirectal inflammation. No definite colonic wall thickening. Vascular/Lymphatic: Advanced aortic and branch atherosclerosis. Ectatic infrarenal aorta at 2.5 cm. Ectatic common iliac arteries with moderate atherosclerosis. The portal vein is patent. No acute vascular findings are seen. 12 mm left inguinal node, series 2, image 80, new from prior exam. Scattered additional prominent bilateral inguinal and external iliac nodes. No retroperitoneal, upper abdominal, or more mesenteric adenopathy. 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 Other: Trace free fluid in the dependent pelvis is likely reactive. There is no upper abdominal ascites. No free air or intra-abdominal abscess. Tiny fat containing umbilical hernia. Musculoskeletal: Advanced degenerative change throughout the thoracic spine. T12 superior endplate compression fracture is unchanged from prior. IMPRESSION: 1. Fluid distending the distal esophagus, stomach, and small bowel loops with fecalization of small bowel contents. Mixed liquid and solid stool throughout the colon, with formed stool distending the rectum. Query fecal impaction. Findings most consistent with generalized slow transit/constipation. No evidence of bowel obstruction or inflammation. 2. Nondistended but thick walled urinary bladder, can be seen  with cystitis. Recommend correlation with urinalysis. 3. Enlarged left inguinal node is new from prior exam, nonspecific. 4. Absent renal excretion on delayed phase imaging suggests underlying renal dysfunction. Aortic Atherosclerosis (ICD10-I70.0). Electronically Signed   By: Keith Rake M.D.   On: 03/31/2020 22:07   DG Abdomen Acute W/Chest  Result Date: 03/31/2020 CLINICAL DATA:  Hypotension and tachycardia. Evaluate for bowel obstruction. EXAM: DG ABDOMEN ACUTE W/ 1V CHEST COMPARISON:  11/23/2019 chest radiograph FINDINGS: Frontal view of the chest demonstrates midline trachea. Normal heart size. Atherosclerosis in the transverse aorta. No pleural effusion or pneumothorax. Clear lungs. Abdominal films demonstrate gas and stool filled colon, including up to 6.8 cm. Stool within the rectum at up to 6.0 cm. No small bowel dilatation. No abnormal abdominal calcifications. No appendicolith. IMPRESSION: Moderate amount of stool within the rectum, possibly representing constipation or fecal impaction. Borderline gaseous distension of the colon without small bowel obstruction. No acute process in the chest. Aortic Atherosclerosis (ICD10-I70.0). Electronically Signed   By: Abigail Miyamoto M.D.   On: 03/31/2020 19:30  [2 weeks]   Assessment:  73 year old male presenting to the ED with abdominal pain, constipation nonbloody emesis, admitted with sepsis and found to have Fusobacterium on blood cultures.  Initially with marked leukemoid reaction now improved. Urine culture unremarkable. We were consulted for acute on chronic anemia.  Acute on chronic anemia: occult GI bleeding noted while inpatient in setting of Eliquis with hemoglobin down to 6.9 during admission. Last dose of Eliquis 9/8.  Has received 2 units of packed red blood cells.  Hemoglobin currently 9.9.  CT findings of constipation/?fecal impaction without bowel obstruction.  He had numerous liquid bowel movements in the ED, lasting for about 48  hours.  Both GI pathogen panel and C. difficile were negative. No prior colonoscopy or endoscopy. Difficult bowel prep delaying procedures. Appears clear after enemas today.  Plan: 1. Colonoscopy and egd today with moderate sedation per Dr. Gala Romney.  2. Wife has been updated and will be at home. She is requested call after procedures with results.   Laureen Ochs. Bernarda Caffey [facility] [phone] 9/15/202110:06 AM     LOS: 8 days

## 2020-04-09 NOTE — Progress Notes (Signed)
Pt down via stretcher to Endo for scheduled procedure. Wife aware. Report given to Endo staff RNs at bedside.

## 2020-04-09 NOTE — Progress Notes (Signed)
Pt has received first of two ordered tap water enemas. Awaiting results.

## 2020-04-09 NOTE — Progress Notes (Addendum)
PROGRESS NOTE    Nathan [name]  XNT:[id number] DOB: 06-22-1946 DOA: 03/31/2020 PCP: Patient, No Pcp Per   Brief Narrative:  Per HPI: Nathan Pile Kopecis a 74 y.o.malewith medical history significant ofDVT, GERD, hypertension, hyperlipidemia, PTSD, chronic renal insufficiency, history of seizures (last one was in 2004) who is coming to the emergency department due toprogressively worse abdominal pain associated with constipation, decreased oral intake, fatigue, malaise and postural dizziness for the past 3 days. He also had 2 episodes of nonbloody emesis in the past 24 hours. He denies melena or hematochezia. No dysuria, frequency or hematuria. However, he states that he has been urinating less than usual. He denies fever, chills, night sweats, rhinorrhea, sore throat, dyspnea, wheezing or hemoptysis. No chest pain, palpitations, diaphoresis, PND, orthopnea or recent pitting edema of the lower extremities. He denies polyuria, polydipsia, polyphagia or blurred vision.  9/8-9/14: Patient was initially admitted with sepsis of unknown cause and was noted to have some abdominal pain and diarrhea which have now resolved.  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  He was noted to have an episode of acute anemia as well requiring 2 unit PRBC transfusion and currently remained stable with no overt bleeding, however he was noted to be positive for Hemoccult and is noted to be on Eliquis at home for prior history of DVT.  This has currently been held and GI is anticipating upper and lower endoscopy.  9/15: Pt has been fully prepped for EGD/colon today.   Assessment & Plan:   Principal Problem:   Sepsis due to undetermined organism [facility]) Active Problems:   Seizure ([place])   HTN (hypertension)   GERD (gastroesophageal reflux disease)   Hyponatremia   Lactic acidosis   AKI (acute kidney injury) ([place])   Normocytic anemia   Thrombocytosis  (HCC)   Hyperlipidemia   Hyperglycemia   Heme positive stool  Sepsis on admissionwith Fusobacterium bacteremia -Patient now with diarrheaand C. difficile testing negative. GI panelnegative -Urine cultures with insignificant growth -Continue current antibiotics, now with Unasyn -Planing for total 2-week antibiotic treatment to complete course with p.o. antibiotics when ready for dc, likely Flagyl  Acute anemia-improved status post 2 unit PRBC transfusion on 9/9; stable -Stool occultpositive -Iron deficiency noted on anemia panel, but will hold off on iron transfusion given acute infection -Appreciate GI consultation with plans for EGD and colonoscopy on 9/15  Ongoing diarrhea-intermittent -Cdiff negative -GI panelnegative -Imodium prn  Lactic acidosis secondary to above-resolved  AKI-resolved -Likely secondary to sepsis physiology -Creatinine at baseline near 0.9 and currently0.7 -Continue to monitor -Hold further IV fluid since diarrhea has resolved  Hyponatremia-stable -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  Seizure history -Continue oxycarbamazepine -Continue Sinemet  Hypertension -Hold lisinopril and start metoprolol -Labetalol prn  GERD -PPI  Significant leukocytosis likelyleukemoid reaction -Appreciate hematology evaluation; can follow up outpatient at this point -Continue current antibiotics as ordered  Dyslipidemia -Resume home statin  History of prior DVT -Hold Eliquis for now until endoscopy -Plan to resume on dc pending endoscopic evaluation  Mild hypomagnesemia -Repleted IV   DVT prophylaxis:SCDs Code Status:Full Family Communication:Discussed withwife 9/14 Disposition Plan: Status is: Inpatient  Remains inpatient appropriate because:IV treatments appropriate due to intensity of illness or inability to take PO and Inpatient level of care appropriate due to  severity of illness  Dispo: The patient is from:Home Anticipated d/c is to:SNF Anticipated d/c date is:1-2days Patient currently  is not medically stable to d/c.  Awaiting EGD/colonoscopy and GI recommendations.   Consultants:  Heme/Onc  GI  Procedures:  See below  Antimicrobials:  Anti-infectives (From admission, onward)   Start     Dose/Rate Route Frequency Ordered Stop   04/04/20 1800  Ampicillin-Sulbactam (UNASYN) 3 g in sodium chloride 0.9 % 100 mL IVPB        3 g 200 mL/hr over 30 Minutes Intravenous Every 6 hours 04/04/20 1420     04/03/20 1000  ceFEPIme (MAXIPIME) 2 g in sodium chloride 0.9 % 100 mL IVPB  Status:  Discontinued        2 g 200 mL/hr over 30 Minutes Intravenous Every 8 hours 04/03/20 0909 04/04/20 1420   04/01/20 0830  vancomycin (VANCOREADY) IVPB 750 mg/150 mL  Status:  Discontinued        750 mg 150 mL/hr over 60 Minutes Intravenous Every 12 hours 03/31/20 2024 04/01/20 1004   04/01/20 0400  metroNIDAZOLE (FLAGYL) IVPB 500 mg  Status:  Discontinued        500 mg 100 mL/hr over 60 Minutes Intravenous Every 8 hours 04/01/20 0026 04/04/20 1420   03/31/20 2030  ceFEPIme (MAXIPIME) 2 g in sodium chloride 0.9 % 100 mL IVPB  Status:  Discontinued        2 g 200 mL/hr over 30 Minutes Intravenous Every 12 hours 03/31/20 2024 04/03/20 0909   03/31/20 2015  vancomycin (VANCOREADY) IVPB 2000 mg/400 mL        2,000 mg 200 mL/hr over 120 Minutes Intravenous  Once 03/31/20 2002 04/01/20 0027   03/31/20 2000  ceFEPIme (MAXIPIME) 2 g in sodium chloride 0.9 % 100 mL IVPB  Status:  Discontinued        2 g 200 mL/hr over 30 Minutes Intravenous  Once 03/31/20 1954 03/31/20 2024   03/31/20 2000  metroNIDAZOLE (FLAGYL) IVPB 500 mg        500 mg 100 mL/hr over 60 Minutes Intravenous  Once 03/31/20 1954 03/31/20 2159   03/31/20 2000  vancomycin (VANCOCIN) IVPB 1000 mg/200 mL premix  Status:  Discontinued        1,000 mg 200  mL/hr over 60 Minutes Intravenous  Once 03/31/20 1954 03/31/20 2002     Subjective: Pt is prepping for EGD colonoscopy later today.  No specific complaints.   Objective: Vitals:   04/08/20 1350 04/08/20 2116 04/09/20 0500 04/09/20 1234  BP: 139/86 (!) 156/91 (!) 155/87 (!) 148/79  Pulse: 76 72 86 95  Resp: 18 20 18 18   Temp: 97.9 F (36.6 C) 98.2 F (36.8 C) 98.1 F (36.7 C)   TempSrc: Oral Oral Oral   SpO2: 97% 99% 97% 100%  Weight:      Height:        Intake/Output Summary (Last 24 hours) at 04/09/2020 1312 Last data filed at 04/08/2020 1845 Gross per 24 hour  Intake 1097.15 ml  Output 650 ml  Net 447.15 ml   Filed Weights   04/01/20 0206 04/02/20 0500 04/02/20 1930  Weight: 86 kg 86 kg 86.4 kg   Examination:  General exam: Appears calm and comfortable  Respiratory system: Clear to auscultation. Respiratory effort normal. Cardiovascular system: normal S1 & S2 heard.  Gastrointestinal system: Abdomen is nondistended, soft and nontender. Central nervous system: Alert and oriented. No focal neurological deficits. Extremities: Symmetric 5 x 5 power. Skin: No rashes, lesions or ulcers Psychiatry: Flat affect.  Data Reviewed: I have personally reviewed following labs and imaging studies  CBC: Recent Labs  Lab 04/03/20 0604 04/03/20 2122 04/04/20 0417 04/04/20 0417 04/05/20 0756 04/06/20 0854 04/07/20 0911 04/08/20 0553 04/09/20 0556  WBC 23.4*  --  18.7*   < > 19.2* 18.3* 17.2* 17.1* 14.4*  NEUTROABS 20.2*  --  15.3*  --  15.9* 14.3* 13.7*  --   --   HGB 6.9*   < > 9.1*   < > 10.1* 10.6* 10.3* 9.8* 9.9*  HCT 21.6*   < > 28.3*   < > 31.2* 33.0* 32.1* 30.8* 31.4*  MCV 83.7  --  84.5   < > 84.8 84.4 84.9 85.8 85.3  PLT 442*  --  431*   < > 456* 475* 432* 456* 466*   < > = values in this interval not displayed.   Basic Metabolic Panel: Recent Labs  Lab 04/05/20 0756 04/06/20 0854 04/07/20 0911 04/08/20 0553 04/09/20 0556  NA 134* 130* 129* 130* 129*  K  4.3 4.1 3.9 3.6 3.8  CL 101 99 95* 96* 95*  CO2 23 23 25 24 22   GLUCOSE 94 120* 108* 93 83  BUN 13 13 15 15 14   CREATININE 0.87 0.79 0.77 0.77 0.72  CALCIUM 8.2* 7.9* 7.9* 7.9* 8.0*  MG 1.5* 1.6* 1.7 1.6* 1.7   GFR: Estimated Creatinine Clearance: 85 mL/min (by C-G formula based on SCr of 0.72 mg/dL). Liver Function Tests: Recent Labs  Lab 04/03/20 0604  AST 20  ALT 11  ALKPHOS 55  BILITOT 0.5  PROT 5.5*  ALBUMIN 1.9*   No results for input(s): LIPASE, AMYLASE in the last 168 hours. No results for input(s): AMMONIA in the last 168 hours. Coagulation Profile: No results for input(s): INR, PROTIME in the last 168 hours. Cardiac Enzymes: No results for input(s): CKTOTAL, CKMB, CKMBINDEX, TROPONINI in the last 168 hours. BNP (last 3 results) No results for input(s): PROBNP in the last 8760 hours. HbA1C: No results for input(s): HGBA1C in the last 72 hours. CBG: No results for input(s): GLUCAP in the last 168 hours. Lipid Profile: No results for input(s): CHOL, HDL, LDLCALC, TRIG, CHOLHDL, LDLDIRECT in the last 72 hours. Thyroid Function Tests: No results for input(s): TSH, T4TOTAL, FREET4, T3FREE, THYROIDAB in the last 72 hours. Anemia Panel: No results for input(s): VITAMINB12, FOLATE, FERRITIN, TIBC, IRON, RETICCTPCT in the last 72 hours. Sepsis Labs: Recent Labs  Lab 04/03/20 0604 04/04/20 0417  PROCALCITON  --  10.69  LATICACIDVEN 0.6  --     Recent Results (from the past 240 hour(s))  Blood culture (routine single)     Status: Abnormal   Collection Time: 03/31/20  7:00 PM   Specimen: Left Antecubital; Blood  Result Value Ref Range Status   Specimen Description   Final    LEFT ANTECUBITAL Performed at [facility], [address], [place], [place] [address]    Special Requests   Final    BOTTLES DRAWN AEROBIC AND ANAEROBIC Blood Culture adequate volume Performed at [place] [place], [place] [address]    Culture  Setup Time   Final     GRAM NEGATIVE RODS ANAEROBIC BOTTLE Gram Stain Report Called to,Read Back By and Verified With: MURPHY,[email or web address]  BY MATTHEWS, B 9.7.21 [place] [facility] GRAM STAIN REVIEWED-AGREE WITH RESULT    Culture (A)  Final    FUSOBACTERIUM NUCLEATUM BETA LACTAMASE NEGATIVE Performed at [facility], [place] [address] [place], [place] [address]    Report Status 04/04/2020 FINAL  Final  SARS Coronavirus 2  by RT PCR (hospital order, performed in [facility] hospital lab) Nasopharyngeal Nasopharyngeal Swab     Status: None   Collection Time: 03/31/20  7:00 PM   Specimen: Nasopharyngeal Swab  Result Value Ref Range Status   SARS Coronavirus 2 NEGATIVE NEGATIVE Final    Comment: (NOTE) SARS-CoV-2 target nucleic acids are NOT DETECTED.  The SARS-CoV-2 RNA is generally detectable in upper and lower respiratory specimens during the acute phase of infection. The lowest concentration of SARS-CoV-2 viral copies this assay can detect is 250 copies / mL. A negative result does not preclude SARS-CoV-2 infection and should not be used as the sole basis for treatment or other patient management decisions.  A negative result may occur with improper specimen collection / handling, submission of specimen other than nasopharyngeal swab, presence of viral mutation(s) within the areas targeted by this assay, and inadequate number of viral copies (<250 copies / mL). A negative result must be combined with clinical observations, patient history, and epidemiological information.  Fact Sheet for Patients:   StrictlyIdeas.no  Fact Sheet for Healthcare Providers: BankingDealers.co.za  This test is not yet approved or  cleared by the [place] FDA and has been authorized for detection and/or diagnosis of SARS-CoV-2 by FDA under an Emergency Use Authorization (EUA).  This EUA will remain in effect (meaning this test can be used) for the duration of the COVID-19  declaration under Section 564(b)(1) of the Act, 21 U.S.C. section 360bbb-3(b)(1), unless the authorization is terminated or revoked sooner.  Performed at [facility], [address], [place], [place] [address]   Blood Culture ID Panel (Reflexed)     Status: None   Collection Time: 03/31/20  7:00 PM  Result Value Ref Range Status   Enterococcus faecalis NOT DETECTED NOT DETECTED Final   Enterococcus Faecium NOT DETECTED NOT DETECTED Final   Listeria monocytogenes NOT DETECTED NOT DETECTED Final   Staphylococcus species NOT DETECTED NOT DETECTED Final   Staphylococcus aureus (BCID) NOT DETECTED NOT DETECTED Final   Staphylococcus epidermidis NOT DETECTED NOT DETECTED Final   Staphylococcus lugdunensis NOT DETECTED NOT DETECTED Final   Streptococcus species NOT DETECTED NOT DETECTED Final   Streptococcus agalactiae NOT DETECTED NOT DETECTED Final   Streptococcus pneumoniae NOT DETECTED NOT DETECTED Final   Streptococcus pyogenes NOT DETECTED NOT DETECTED Final   A.calcoaceticus-baumannii NOT DETECTED NOT DETECTED Final   Bacteroides fragilis NOT DETECTED NOT DETECTED Final   Enterobacterales NOT DETECTED NOT DETECTED Final   Enterobacter cloacae complex NOT DETECTED NOT DETECTED Final   Escherichia coli NOT DETECTED NOT DETECTED Final   Klebsiella aerogenes NOT DETECTED NOT DETECTED Final   Klebsiella oxytoca NOT DETECTED NOT DETECTED Final   Klebsiella pneumoniae NOT DETECTED NOT DETECTED Final   Proteus species NOT DETECTED NOT DETECTED Final   Salmonella species NOT DETECTED NOT DETECTED Final   Serratia marcescens NOT DETECTED NOT DETECTED Final   Haemophilus influenzae NOT DETECTED NOT DETECTED Final   Neisseria meningitidis NOT DETECTED NOT DETECTED Final   Pseudomonas aeruginosa NOT DETECTED NOT DETECTED Final   Stenotrophomonas maltophilia NOT DETECTED NOT DETECTED Final   Candida albicans NOT DETECTED NOT DETECTED Final   Candida auris NOT DETECTED NOT DETECTED Final    Candida glabrata NOT DETECTED NOT DETECTED Final   Candida krusei NOT DETECTED NOT DETECTED Final   Candida parapsilosis NOT DETECTED NOT DETECTED Final   Candida tropicalis NOT DETECTED NOT DETECTED Final   Cryptococcus neoformans/gattii NOT DETECTED NOT DETECTED Final    Comment:  Performed at [facility], [place] [address] [place], [place] [address]  Culture, blood (single)     Status: None   Collection Time: 03/31/20  8:55 PM   Specimen: BLOOD  Result Value Ref Range Status   Specimen Description BLOOD RIGHT ANTECUBITAL  Final   Special Requests   Final    BOTTLES DRAWN AEROBIC AND ANAEROBIC Blood Culture adequate volume   Culture   Final    NO GROWTH 5 DAYS Performed at [facility], [address] [place], [place] [address]    Report Status 04/05/2020 FINAL  Final  Urine culture     Status: Abnormal   Collection Time: 04/01/20 12:44 AM   Specimen: In/Out Cath Urine  Result Value Ref Range Status   Specimen Description   Final    IN/OUT CATH URINE Performed at [facility], [address] [place], [place] [address]    Special Requests   Final    NONE Performed at [facility], [address], [place], [place] [address]    Culture (A)  Final    <10,000 COLONIES/mL INSIGNIFICANT GROWTH Performed at [facility], [place] [address] [place], [place] [address]    Report Status 04/02/2020 FINAL  Final  MRSA PCR Screening     Status: None   Collection Time: 04/01/20  1:54 AM   Specimen: Nasopharyngeal  Result Value Ref Range Status   MRSA by PCR NEGATIVE NEGATIVE Final    Comment:        The GeneXpert MRSA Assay (FDA approved for NASAL specimens only), is one component of a comprehensive MRSA colonization surveillance program. It is not intended to diagnose MRSA infection nor to guide or monitor treatment for MRSA infections. Performed at [facility], [address] [place], [place] [address]   C Difficile Quick Screen w PCR reflex     Status: None    Collection Time: 04/02/20  1:14 PM   Specimen: Stool  Result Value Ref Range Status   C Diff antigen NEGATIVE NEGATIVE Final   C Diff toxin NEGATIVE NEGATIVE Final   C Diff interpretation No C. difficile detected.  Final    Comment: Performed at [facility] [address] [place],  [address]  Gastrointestinal Panel by PCR , Stool     Status: None   Collection Time: 04/02/20  1:14 PM   Specimen: Stool  Result Value Ref Range Status   Campylobacter species NOT DETECTED NOT DETECTED Final   Plesimonas shigelloides NOT DETECTED NOT DETECTED Final   Salmonella species NOT DETECTED NOT DETECTED Final   Yersinia enterocolitica NOT DETECTED NOT DETECTED Final   Vibrio species NOT DETECTED NOT DETECTED Final   Vibrio cholerae NOT DETECTED NOT DETECTED Final   Enteroaggregative E coli (EAEC) NOT DETECTED NOT DETECTED Final   Enteropathogenic E coli (EPEC) NOT DETECTED NOT DETECTED Final   Enterotoxigenic E coli (ETEC) NOT DETECTED NOT DETECTED Final   Shiga like toxin producing E coli (STEC) NOT DETECTED NOT DETECTED Final   Shigella/Enteroinvasive E coli (EIEC) NOT DETECTED NOT DETECTED Final   Cryptosporidium NOT DETECTED NOT DETECTED Final   Cyclospora cayetanensis NOT DETECTED NOT DETECTED Final   Entamoeba histolytica NOT DETECTED NOT DETECTED Final   Giardia lamblia NOT DETECTED NOT DETECTED Final   Adenovirus F40/41 NOT DETECTED NOT DETECTED Final   Astrovirus NOT DETECTED NOT DETECTED Final   Norovirus GI/GII NOT DETECTED NOT DETECTED Final   Rotavirus A NOT DETECTED NOT DETECTED Final   Sapovirus (  I, II, IV, and V) NOT DETECTED NOT DETECTED Final    Comment: Performed at [facility], [address] [place], [place] [address]    Radiology Studies: No results found.  Scheduled Meds: . sodium chloride   Intravenous Once  . ARIPiprazole  2 mg Oral QHS  . carbidopa-levodopa  1 tablet Oral BID  . Chlorhexidine Gluconate Cloth  6 each Topical Daily  . feeding  supplement (ENSURE ENLIVE)  237 mL Oral TID BM  . loratadine  10 mg Oral Daily  . metoprolol tartrate  50 mg Oral BID  . OXcarbazepine  150 mg Oral BID  . pantoprazole (PROTONIX) IV  40 mg Intravenous Q24H  . simvastatin  20 mg Oral QHS  . venlafaxine XR  150 mg Oral QHS   Continuous Infusions: . ampicillin-sulbactam (UNASYN) IV 3 g (04/09/20 1233)    LOS: 8 days   Time spent: 30 minutes  Malala Trenkamp Wynetta Emery, MD How to contact the [facility] Attending or Consulting provider [place] or covering provider during after hours [place], for this patient?  1. Check the care team in [facility] and look for a) attending/consulting [facility] provider listed and b) the [facility] team listed 2. Log into [email or web address] and use [place] universal password to access. If you do not have the password, please contact the hospital operator. 3. Locate the [facility] provider you are looking for under [facility] and page to a number that you can be directly reached. 4. If you still have difficulty reaching the provider, please page the [facility] (Director on Call) for the Hospitalists listed on amion for assistance.   If 7PM-7AM, please contact night-coverage [email or web address] 04/09/2020, 1:12 PM

## 2020-04-10 DIAGNOSIS — R6520 Severe sepsis without septic shock: Secondary | ICD-10-CM

## 2020-04-10 DIAGNOSIS — K5010 Crohn's disease of large intestine without complications: Secondary | ICD-10-CM

## 2020-04-10 LAB — SARS CORONAVIRUS 2 BY RT PCR (HOSPITAL ORDER, PERFORMED IN ~~LOC~~ HOSPITAL LAB): SARS Coronavirus 2: NEGATIVE

## 2020-04-10 MED ORDER — SACCHAROMYCES BOULARDII 250 MG PO CAPS
250.0000 mg | ORAL_CAPSULE | Freq: Two times a day (BID) | ORAL | Status: DC
  Administered 2020-04-10 (×2): 250 mg via ORAL
  Filled 2020-04-10 (×2): qty 1

## 2020-04-10 MED ORDER — SACCHAROMYCES BOULARDII 250 MG PO CAPS: 250.0000 mg | ORAL_CAPSULE | Freq: Two times a day (BID) | ORAL | 0 refills | Status: AC

## 2020-04-10 MED ORDER — APIXABAN 5 MG PO TABS: 5.0000 mg | ORAL_TABLET | Freq: Two times a day (BID) | ORAL | Status: AC

## 2020-04-10 MED ORDER — ACETAMINOPHEN 325 MG PO TABS: 650.0000 mg | ORAL_TABLET | Freq: Four times a day (QID) | ORAL | Status: AC | PRN

## 2020-04-10 MED ORDER — GABAPENTIN 300 MG PO CAPS: 300.0000 mg | ORAL_CAPSULE | Freq: Three times a day (TID) | ORAL | Status: AC

## 2020-04-10 MED ORDER — AMOXICILLIN-POT CLAVULANATE 875-125 MG PO TABS: 1.0000 | ORAL_TABLET | Freq: Two times a day (BID) | ORAL | 0 refills | Status: AC

## 2020-04-10 MED ORDER — AMOXICILLIN-POT CLAVULANATE 875-125 MG PO TABS: 1.0000 | ORAL_TABLET | Freq: Two times a day (BID) | ORAL | Status: DC

## 2020-04-10 MED ORDER — METHOCARBAMOL 500 MG PO TABS: 500.0000 mg | ORAL_TABLET | Freq: Three times a day (TID) | ORAL | Status: AC | PRN

## 2020-04-10 MED ORDER — POLYETHYLENE GLYCOL 3350 17 G PO PACK: 17.0000 g | PACK | Freq: Every day | ORAL | 0 refills | Status: AC | PRN

## 2020-04-10 NOTE — Plan of Care (Signed)

## 2020-04-10 NOTE — Progress Notes (Addendum)
Subjective:  Sitting up in bed. Alert and oriented. No complaints except he doesn't like the food here. No abdominal pain. No n/v. Feeling better.   Objective: Vital signs in last 24 hours: Temp:  [98.7 F (37.1 C)] 98.7 F (37.1 C) (09/16 0600) Pulse Rate:  [73-95] 82 (09/16 0600) Resp:  [15-27] 15 (09/16 0600) BP: (111-159)/(53-88) 154/73 (09/16 0600) SpO2:  [94 %-100 %] 94 % (09/16 0600) Last BM Date: 04/09/20 General:   Alert, elderly, pleasant and cooperative in NAD Head:  Normocephalic and atraumatic. Eyes:  Sclera clear, no icterus.  Abdomen:  Soft, nontender and nondistended. Normal bowel sounds, without guarding, and without rebound.   Extremities:  Without clubbing, deformity or edema. Neurologic:  Alert and  oriented x4;  grossly normal neurologically. Skin:  Intact without significant lesions or rashes. Psych:  Alert and cooperative. Normal mood and affect.  Intake/Output from previous day: 09/15 0701 - 09/16 0700 In: 250 [I.V.:250] Out: 1100 [Urine:1100] Intake/Output this shift: No intake/output data recorded.  Lab Results: CBC Recent Labs    04/08/20 0553 04/09/20 0556  WBC 17.1* 14.4*  HGB 9.8* 9.9*  HCT 30.8* 31.4*  MCV 85.8 85.3  PLT 456* 466*   BMET Recent Labs    04/08/20 0553 04/09/20 0556  NA 130* 129*  K 3.6 3.8  CL 96* 95*  CO2 24 22  GLUCOSE 93 83  BUN 15 14  CREATININE 0.77 0.72  CALCIUM 7.9* 8.0*   LFTs No results for input(s): BILITOT, BILIDIR, IBILI, ALKPHOS, AST, ALT, PROT, ALBUMIN in the last 72 hours. No results for input(s): LIPASE in the last 72 hours. PT/INR No results for input(s): LABPROT, INR in the last 72 hours.    Imaging Studies: CT Head Wo Contrast  Result Date: 03/31/2020 CLINICAL DATA:  Seizure EXAM: CT HEAD WITHOUT CONTRAST TECHNIQUE: Contiguous axial images were obtained from the base of the skull through the vertex without intravenous contrast. COMPARISON:  11/23/2019 FINDINGS: Brain: There is atrophy and  chronic small vessel disease changes. No acute intracranial abnormality. Specifically, no hemorrhage, hydrocephalus, mass lesion, acute infarction, or significant intracranial injury. Vascular: No hyperdense vessel or unexpected calcification. Skull: No acute calvarial abnormality. Sinuses/Orbits: Visualized paranasal sinuses and mastoids clear. Orbital soft tissues unremarkable. Other: None IMPRESSION: Atrophy, chronic microvascular disease. No acute intracranial abnormality. Electronically Signed   By: Rolm Baptise M.D.   On: 03/31/2020 22:03   CT ABDOMEN PELVIS W CONTRAST  Result Date: 03/31/2020 CLINICAL DATA:  Bowel obstruction suspected. Constipation, vomiting and generalized abdominal pain. Syncopal episode. EXAM: CT ABDOMEN AND PELVIS WITH CONTRAST TECHNIQUE: Multidetector CT imaging of the abdomen and pelvis was performed using the standard protocol following bolus administration of intravenous contrast. CONTRAST:  74mL OMNIPAQUE IOHEXOL 300 MG/ML  SOLN COMPARISON:  Radiograph earlier this day. Abdominopelvic CT 11/22/2019 FINDINGS: Lower chest: No basilar consolidation or pleural effusion. There are coronary artery calcifications. Fluid distending the distal esophagus. Hepatobiliary: Unchanged low-density lesions in the right lobe of the liver. No new hepatic lesion. Gallbladder physiologically distended, no calcified stone. No biliary dilatation. Pancreas: Parenchymal atrophy. No ductal dilatation or inflammation. Spleen: Normal in size without focal abnormality. Adrenals/Urinary Tract: No adrenal nodule. No hydronephrosis. Tiny cortical hypodensity in the upper left mid lower right kidney too small to characterize but likely small cysts. No significant perinephric edema. No renal or ureteral calculi. There is absent renal excretion on delayed phase imaging. Nondistended but thick walled urinary bladder. Stomach/Bowel: Fluid distending the distal esophagus. Fluid distending the stomach.  No gastric wall  thickening. Small bowel loops are diffusely fluid-filled but no transition point. There is fecalization of small bowel contents involving the distal ileum. No definite wall thickening or perienteric edema. Normal appendix. Mild colonic distension with liquid and solid stool. There is formed stool in the sigmoid colon. Stool distends the rectum with rectal distention of 5.1 cm. No perirectal inflammation. No definite colonic wall thickening. Vascular/Lymphatic: Advanced aortic and branch atherosclerosis. Ectatic infrarenal aorta at 2.5 cm. Ectatic common iliac arteries with moderate atherosclerosis. The portal vein is patent. No acute vascular findings are seen. 12 mm left inguinal node, series 2, image 80, new from prior exam. Scattered additional prominent bilateral inguinal and external iliac nodes. No retroperitoneal, upper abdominal, or more mesenteric adenopathy. 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 Other: Trace free fluid in the dependent pelvis is likely reactive. There is no upper abdominal ascites. No free air or intra-abdominal abscess. Tiny fat containing umbilical hernia. Musculoskeletal: Advanced degenerative change throughout the thoracic spine. T12 superior endplate compression fracture is unchanged from prior. IMPRESSION: 1. Fluid distending the distal esophagus, stomach, and small bowel loops with fecalization of small bowel contents. Mixed liquid and solid stool throughout the colon, with formed stool distending the rectum. Query fecal impaction. Findings most consistent with generalized slow transit/constipation. No evidence of bowel obstruction or inflammation. 2. Nondistended but thick walled urinary bladder, can be seen with cystitis. Recommend correlation with urinalysis. 3. Enlarged left inguinal node is new from prior exam, nonspecific. 4. Absent renal excretion on delayed phase imaging suggests underlying renal dysfunction. Aortic Atherosclerosis (ICD10-I70.0).  Electronically Signed   By: Keith Rake M.D.   On: 03/31/2020 22:07   DG Abdomen Acute W/Chest  Result Date: 03/31/2020 CLINICAL DATA:  Hypotension and tachycardia. Evaluate for bowel obstruction. EXAM: DG ABDOMEN ACUTE W/ 1V CHEST COMPARISON:  11/23/2019 chest radiograph FINDINGS: Frontal view of the chest demonstrates midline trachea. Normal heart size. Atherosclerosis in the transverse aorta. No pleural effusion or pneumothorax. Clear lungs. Abdominal films demonstrate gas and stool filled colon, including up to 6.8 cm. Stool within the rectum at up to 6.0 cm. No small bowel dilatation. No abnormal abdominal calcifications. No appendicolith. IMPRESSION: Moderate amount of stool within the rectum, possibly representing constipation or fecal impaction. Borderline gaseous distension of the colon without small bowel obstruction. No acute process in the chest. Aortic Atherosclerosis (ICD10-I70.0). Electronically Signed   By: Abigail Miyamoto M.D.   On: 03/31/2020 19:30  [2 weeks]   Assessment: 74 y/o male presenting to the ED with abdominal pain, constipation, nonbloody emesis, admitted with sepsis and found to have Fusobacterium on blood cultures. Initially with marked leukemoid reaction, now improved. Urine culture unremarkable. We were consulted for acute on chronic anemia, heme + stools.   Acute on chronic anemia: occult GI bleeding noted while inpatient in setting of Eliquis with hemoglobin down to 6.9 during admission. Last dose of Eliquis 9/8.  Has received 2 units of packed red blood cells.  Hemoglobin up to 9.9.  CT findings of constipation/?fecal impaction without bowel obstruction.  He had numerous liquid bowel movements in the ED, lasting for about 48 hours. Both GI pathogen panel and C. difficile were negative.    EGD/colonoscopy 04/09/20: small hiatal hernia, colonic diverticulosis, segmental colitis involving descending colon most c/w ischemic or segmental colitis typically self-limiting  illness. S/p biopsies.  Plan: 1. Low residue cardiac diet. 2. Follow up on path as available.  3. Can resume Eliquis on 04/14/20 4. Will  sign off, call with questions.   Laureen Ochs. Bernarda Caffey [facility] [phone] 9/16/202111:09 AM     LOS: 9 days

## 2020-04-10 NOTE — TOC Transition Note (Signed)
Transition of Care [facility]) - CM/SW Discharge Note   Patient Details  Name: Nathan [name] MRN: [id number] Date of Birth: 1946/03/19  Transition of [facility]) CM/SW Contact:  Shade Flood, LCSW Phone Number: 04/10/2020, 3:24 PM   Clinical Narrative:     Pt stable for dc today per MD. Reviewed bed offers with pt and his wife. [facility]. DC clinical sent electronically. [facility] can take pt today. Insurance auth received today. EMS arranged for transport. Will print EMS form to the floor. RN will call report.  There are no other TOC needs for dc.   Final next level of care: Skilled Nursing Facility Barriers to Discharge: Barriers Resolved   Patient Goals and CMS Choice Patient states their goals for this hospitalization and ongoing recovery are:: rehab with snf CMS Medicare.gov Compare Post Acute Care list provided to:: Patient Choice offered to / list presented to : Spouse  Discharge Placement PASRR number recieved: 04/07/20            Patient chooses bed at: [facility] [place] Patient to be transferred to facility by: EMS Name of family member notified: Butch Penny Patient and family notified of of transfer: 04/10/20  Discharge Plan and Services                                     Social Determinants of Health (SDOH) Interventions     Readmission Risk Interventions Readmission Risk Prevention Plan 04/07/2020 04/01/2020  Transportation Screening Complete Complete  PCP or Specialist Appt within 3-5 Days Complete -  HRI or Home Care Consult Not Complete -  Social Work Consult for [place] Planning/Counseling Complete -  Palliative Care Screening Not Applicable -  Medication Review [employer] photographer) Complete Complete  Palliative Care Screening - Not Applicable  Some recent data might be hidden

## 2020-04-10 NOTE — Discharge Instructions (Signed)
IMPORTANT INFORMATION: PAY CLOSE ATTENTION   PHYSICIAN DISCHARGE INSTRUCTIONS  Follow with Primary care provider  [facility]  and other consultants as instructed by your Hospitalist Physician  [place] IF SYMPTOMS COME BACK, WORSEN OR NEW PROBLEM DEVELOPS   Please note: You were cared for by a hospitalist during your hospital stay. Every effort will be made to forward records to your primary care provider.  You can request that your primary care provider send for your hospital records if they have not received them.  Once you are discharged, your primary care physician will handle any further medical issues. Please note that NO REFILLS for any discharge medications will be authorized once you are discharged, as it is imperative that you return to your primary care physician (or establish a relationship with a primary care physician if you do not have one) for your post hospital discharge needs so that they can reassess your need for medications and monitor your lab values.  Please get a complete blood count and chemistry panel checked by your Primary MD at your next visit, and again as instructed by your Primary MD.  Get Medicines reviewed and adjusted: Please take all your medications with you for your next visit with your Primary MD  Laboratory/radiological data: Please request your Primary MD to go over all hospital tests and procedure/radiological results at the follow up, please ask your primary care provider to get all Hospital records sent to his/her office.  In some cases, they will be blood work, cultures and biopsy results pending at the time of your discharge. Please request that your primary care provider follow up on these results.  If you are diabetic, please bring your blood sugar readings with you to your follow up appointment with primary care.    Please call and make your follow up appointments as soon as possible.    Also Note  the following: If you experience worsening of your admission symptoms, develop shortness of breath, life threatening emergency, suicidal or homicidal thoughts you must seek medical attention immediately by calling 911 or calling your MD immediately  if symptoms less severe.  You must read complete instructions/literature along with all the possible adverse reactions/side effects for all the Medicines you take and that have been prescribed to you. Take any new Medicines after you have completely understood and accpet all the possible adverse reactions/side effects.   Do not drive when taking Pain medications or sleeping medications (Benzodiazepines)  Do not take more than prescribed Pain, Sleep and Anxiety Medications. It is not advisable to combine anxiety,sleep and pain medications without talking with your primary care practitioner  Special Instructions: If you have smoked or chewed Tobacco  in the last 2 yrs please stop smoking, stop any regular Alcohol  and or any Recreational drug use.  Wear Seat belts while driving.  Do not drive if taking any narcotic, mind altering or controlled substances or recreational drugs or alcohol.

## 2020-04-10 NOTE — Discharge Summary (Signed)
Physician Discharge Summary  Nathan [name] DQQ:[id number] DOB: 1945/12/01 DOA: 03/31/2020  PCP: [facility]  Admit date: 03/31/2020 Discharge date: 04/10/2020  Admitted From:  Home  Disposition:  SNF   Recommendations for Outpatient Follow-up:  1. Follow up with PCP in 2 weeks 2. Follow up with [facility] for biopsy results 1 month  Discharge Condition: STABLE   CODE STATUS: FULL    Brief Hospitalization Summary: Please see all hospital notes, images, labs for full details of the hospitalization. ADMISSION HPI: Nathan [name] is a 74 y.o. male with medical history significant of DVT, GERD, hypertension, hyperlipidemia, PTSD, chronic renal insufficiency, history of seizures (last one was in 2004) who is coming to the emergency department due to progressively worse abdominal pain associated with constipation, decreased oral intake, fatigue, malaise and postural dizziness for the past 3 days.  He also had 2 episodes of nonbloody emesis in the past 24 hours.   He denies melena or hematochezia.  No dysuria, frequency or hematuria.  However, he states that he has been urinating less than usual.  He denies fever, chills, night sweats, rhinorrhea, sore throat, dyspnea, wheezing or hemoptysis.  No chest pain, palpitations, diaphoresis, PND, orthopnea or recent pitting edema of the lower extremities.  He denies polyuria, polydipsia, polyphagia or blurred vision.  ED Course: Initial vital signs were temperature 97.9 F, pulse 125, respiration 18, blood pressure 97/50 mmHg O2 sat 99% on room air.  The patient received 3000 mL of NS bolus, vancomycin and cefepime per pharmacy along with metronidazole 500 mg IVPB.  He has had several bowel movements while in the emergency [facility] and states he feels better.  CBC showed a white count of 42.9, with 91% neutrophils, hemoglobin 10.0 g/dL and platelets 783.  PT was 19.8, INR 1.9 PTT 36.  Lactic acid was 8.0, then 5.7 and then 4.1 mmol/L.  Venous blood gas showed pH of  7.36, PCO2 of 30.8 and PO2 of 67.54mmHg.  Bicarbonate was 18.7 acid base deficit 7.2 mmol/L.  This VBG was drawn at the same time when his lactic acid was 4.1 mmol/L.  Start coronavirus that was negative.  CMP showed a sodium 127, potassium 4.5, chloride 92 and CO2 16 mg/dL.  Anion gap was 19.  Glucose 143, BUN 21 and creatinine 1.55 mg/dL.  LFTs show an albumin of 2.9 g/dL and AST is 54 units/L.  The rest of the hepatic functions were within expected range.  Imaging: Acute abdomen with chest showed moderate amount of stool within the rectum, possibly representing constipation and focal infection.  There is borderline gaseous distention of the colon without small bowel obstruction.  There is no acute process in the chest.  CT head without contrast did not show any acute intracranial abnormality.  CT abdomen/pelvis with contrast showed fluid distending the distal esophagus, stomach and small bowel loops with fecalization with small bowel contents.  There is mixed liquid and solid stool throughout the colon with formed stool distending the rectum.  Nondistended but thick-walled urinary bladder, which could be seen in cystitis.  Enlarged left inguinal node which is new from prior exam.  Absent renal excretion on delayed phase imaging suggests underlying renal dysfunction.  There was aortic atherosclerosis.  Please see images and full radiology report for further detail.    HOSPITAL COURSE 9/8-9/14: Patient was initially admitted with sepsis of unknown cause and was noted to have some abdominal pain and diarrhea which have now resolved.  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  He was noted to have an episode of acute anemia as well requiring 2 unit PRBC transfusion and currently remained stable with no overt bleeding, however he was noted to be positive for Hemoccult and is noted to be on Eliquis at home for prior history of DVT.  This has currently been  held and GI is anticipating upper and lower endoscopy.  9/15: Pt has been fully prepped for EGD/colon today.   9/16: Pt tolerated EGD/colon well and now eating and drinking, per GI can resume apixaban on 04/14/20.   Awaiting SNF bed.    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 difficile testing negative. GI panelnegative -Urine cultures with insignificant growth -Continue current antibiotics. Treated with Unasyn, now transition to oral augmentin to complete full 14 day course.  Added probiotics.   Acute anemia-improved status post 2 unit PRBC transfusion on 9/9; stable -Stool occultpositive -Iron deficiency noted on anemia panel, but will hold off on iron transfusion given acute infection -Appreciate GI consultation with EGD and colonoscopy completed 9/15 EGD/colonoscopy 04/09/20: small hiatal hernia, colonic diverticulosis, segmental colitis involving descending colon most c/w ischemic or segmental colitis typically self-limiting illness. S/p biopsies.  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 -Fluid restriction continued -TSH 4.0  Seizure history -Continue oxycarbamazepine -Continue Sinemet  Hypertension -Hold lisinopril and start metoprolol -Labetalol prn  GERD -PPI  Significant leukocytosis likelyleukemoid reaction -Appreciate hematology evaluation; can follow up outpatient at this point -Continue current antibiotics as ordered  Dyslipidemia -Resume home statin  History of prior DVT -Hold Eliquis for now until endoscopy -Plan to resume on 9/20 per GI recommendations  Mild hypomagnesemia -Repleted IV  DVT prophylaxis:SCDs Code Status:Full Family Communication:Discussed withwife9/14, 9/16 Disposition  Plan:SNF Status is: Inpatient  Discharge Diagnoses:  Principal Problem:   [name] sepsis ([place]) Active Problems:   Seizure ([place])   HTN (hypertension)   GERD (gastroesophageal reflux disease)   Hyponatremia   Lactic acidosis   AKI (acute kidney injury) ([place])   Normocytic anemia   Thrombocytosis ([facility])   Hyperlipidemia   Hyperglycemia   Heme positive stool   Segmental colitis without complication [facility])  Discharge Instructions:  Allergies as of 04/10/2020      Reactions   Terazosin Other (See Comments)   Syncope   Terazosin Other (See Comments)   unknown      Medication List    STOP taking these medications   azelastine 0.1 % nasal spray Commonly known as: ASTELIN   docusate sodium 100 MG capsule Commonly known as: COLACE   enoxaparin 30 MG/0.3ML injection Commonly known as: LOVENOX   feeding supplement (ENSURE ENLIVE) Liqd   fexofenadine 180 MG tablet Commonly known as: ALLEGRA   fluticasone 50 MCG/ACT nasal spray Commonly known as: FLONASE   guaiFENesin 100 MG/5ML Soln Commonly known as: ROBITUSSIN   levETIRAcetam 500 MG tablet Commonly known as: KEPPRA   loratadine 10 MG tablet Commonly known as: CLARITIN   Oxycodone HCl 10 MG Tabs   sodium chloride 0.65 % Soln nasal spray Commonly known as: OCEAN   tamsulosin 0.4 MG Caps capsule Commonly known as: FLOMAX     TAKE these medications   acetaminophen 325 MG tablet Commonly known as: TYLENOL Take 2 tablets (650 mg total) by mouth every 6 (six) hours as needed. What changed:  when to take this  reasons to take this   albuterol 108 (90 Base) MCG/ACT inhaler Commonly known as: VENTOLIN HFA Inhale 2 puffs into the lungs every 6 (six) hours as needed for wheezing or shortness of breath.   amoxicillin-clavulanate 875-125 MG tablet Commonly known as: AUGMENTIN Take 1 tablet by mouth every 12 (twelve) hours for 4 days. Start taking on: April 11, 2020   apixaban 5 MG Tabs tablet Commonly  known as: Eliquis Take 1 tablet (5 mg total) by mouth in the morning and at bedtime. Start taking on: April 14, 2020 What changed:   how much to take  These instructions start on April 14, 2020. If you are unsure what to do until then, ask your doctor or other care provider.   ARIPiprazole 2 MG tablet Commonly known as: ABILIFY Take 2 mg by mouth at bedtime.   carbidopa-levodopa 25-100 MG tablet Commonly known as: SINEMET IR Take 1 tablet by mouth in the morning and at bedtime.   cetirizine 10 MG tablet Commonly known as: ZYRTEC Take 10 mg by mouth daily.   famotidine 20 MG tablet Commonly known as: Pepcid Take 1 tablet (20 mg total) by mouth 2 (two) times daily.   gabapentin 300 MG capsule Commonly known as: NEURONTIN Take 1 capsule (300 mg total) by mouth 3 (three) times daily. What changed: when to take this   lisinopril 40 MG tablet Commonly known as: ZESTRIL Take 0.5 tablets (20 mg total) by mouth every morning.   methocarbamol 500 MG tablet Commonly known as: ROBAXIN Take 1 tablet (500 mg total) by mouth every 8 (eight) hours as needed for muscle spasms. What changed:   when to take this  reasons to take this   metoprolol tartrate 50 MG tablet Commonly known as: LOPRESSOR Take 50 mg by mouth 2 (two) times daily.   multivitamin with minerals Tabs tablet Take 1 tablet by mouth at bedtime.   Oxcarbazepine 300 MG tablet Commonly known as: TRILEPTAL Take 1 tablet (300 mg total) by mouth 2 (two) times daily. What changed: how much to take   polyethylene glycol 17 g packet Commonly known as: MIRALAX / GLYCOLAX Take 17 g by mouth daily as needed for mild constipation. What changed:   when to take this  reasons to take this   saccharomyces boulardii 250 MG capsule Commonly known as: FLORASTOR Take 1 capsule (250 mg total) by mouth 2 (two) times daily.   simvastatin 40 MG tablet Commonly known as: ZOCOR Take 20 mg by mouth at bedtime.    venlafaxine XR 150 MG 24 hr capsule Commonly known as: EFFEXOR-XR Take 150 mg by mouth daily with breakfast.       Follow-up Information    Primary care provider. Schedule an appointment as soon as possible for a visit in 2 week(s).              Allergies  Allergen Reactions  . Terazosin Other (See Comments)    Syncope  . Terazosin Other (See Comments)    unknown   Allergies as of 04/10/2020      Reactions   Terazosin Other (See Comments)   Syncope   Terazosin Other (See Comments)   unknown      Medication List    STOP taking these medications   azelastine 0.1 % nasal spray Commonly known as: ASTELIN   docusate sodium 100 MG capsule Commonly known as: COLACE   enoxaparin 30 MG/0.3ML injection Commonly known as: LOVENOX  feeding supplement (ENSURE ENLIVE) Liqd   fexofenadine 180 MG tablet Commonly known as: ALLEGRA   fluticasone 50 MCG/ACT nasal spray Commonly known as: FLONASE   guaiFENesin 100 MG/5ML Soln Commonly known as: ROBITUSSIN   levETIRAcetam 500 MG tablet Commonly known as: KEPPRA   loratadine 10 MG tablet Commonly known as: CLARITIN   Oxycodone HCl 10 MG Tabs   sodium chloride 0.65 % Soln nasal spray Commonly known as: OCEAN   tamsulosin 0.4 MG Caps capsule Commonly known as: FLOMAX     TAKE these medications   acetaminophen 325 MG tablet Commonly known as: TYLENOL Take 2 tablets (650 mg total) by mouth every 6 (six) hours as needed. What changed:   when to take this  reasons to take this   albuterol 108 (90 Base) MCG/ACT inhaler Commonly known as: VENTOLIN HFA Inhale 2 puffs into the lungs every 6 (six) hours as needed for wheezing or shortness of breath.   amoxicillin-clavulanate 875-125 MG tablet Commonly known as: AUGMENTIN Take 1 tablet by mouth every 12 (twelve) hours for 4 days. Start taking on: April 11, 2020   apixaban 5 MG Tabs tablet Commonly known as: Eliquis Take 1 tablet (5 mg total) by mouth in the  morning and at bedtime. Start taking on: April 14, 2020 What changed:   how much to take  These instructions start on April 14, 2020. If you are unsure what to do until then, ask your doctor or other care provider.   ARIPiprazole 2 MG tablet Commonly known as: ABILIFY Take 2 mg by mouth at bedtime.   carbidopa-levodopa 25-100 MG tablet Commonly known as: SINEMET IR Take 1 tablet by mouth in the morning and at bedtime.   cetirizine 10 MG tablet Commonly known as: ZYRTEC Take 10 mg by mouth daily.   famotidine 20 MG tablet Commonly known as: Pepcid Take 1 tablet (20 mg total) by mouth 2 (two) times daily.   gabapentin 300 MG capsule Commonly known as: NEURONTIN Take 1 capsule (300 mg total) by mouth 3 (three) times daily. What changed: when to take this   lisinopril 40 MG tablet Commonly known as: ZESTRIL Take 0.5 tablets (20 mg total) by mouth every morning.   methocarbamol 500 MG tablet Commonly known as: ROBAXIN Take 1 tablet (500 mg total) by mouth every 8 (eight) hours as needed for muscle spasms. What changed:   when to take this  reasons to take this   metoprolol tartrate 50 MG tablet Commonly known as: LOPRESSOR Take 50 mg by mouth 2 (two) times daily.   multivitamin with minerals Tabs tablet Take 1 tablet by mouth at bedtime.   Oxcarbazepine 300 MG tablet Commonly known as: TRILEPTAL Take 1 tablet (300 mg total) by mouth 2 (two) times daily. What changed: how much to take   polyethylene glycol 17 g packet Commonly known as: MIRALAX / GLYCOLAX Take 17 g by mouth daily as needed for mild constipation. What changed:   when to take this  reasons to take this   saccharomyces boulardii 250 MG capsule Commonly known as: FLORASTOR Take 1 capsule (250 mg total) by mouth 2 (two) times daily.   simvastatin 40 MG tablet Commonly known as: ZOCOR Take 20 mg by mouth at bedtime.   venlafaxine XR 150 MG 24 hr capsule Commonly known as:  EFFEXOR-XR Take 150 mg by mouth daily with breakfast.       Procedures/Studies: CT Head Wo Contrast  Result Date: 03/31/2020 CLINICAL DATA:  Seizure EXAM:  CT HEAD WITHOUT CONTRAST TECHNIQUE: Contiguous axial images were obtained from the base of the skull through the vertex without intravenous contrast. COMPARISON:  11/23/2019 FINDINGS: Brain: There is atrophy and chronic small vessel disease changes. No acute intracranial abnormality. Specifically, no hemorrhage, hydrocephalus, mass lesion, acute infarction, or significant intracranial injury. Vascular: No hyperdense vessel or unexpected calcification. Skull: No acute calvarial abnormality. Sinuses/Orbits: Visualized paranasal sinuses and mastoids clear. Orbital soft tissues unremarkable. Other: None IMPRESSION: Atrophy, chronic microvascular disease. No acute intracranial abnormality. Electronically Signed   By: Rolm Baptise M.D.   On: 03/31/2020 22:03   CT ABDOMEN PELVIS W CONTRAST  Result Date: 03/31/2020 CLINICAL DATA:  Bowel obstruction suspected. Constipation, vomiting and generalized abdominal pain. Syncopal episode. EXAM: CT ABDOMEN AND PELVIS WITH CONTRAST TECHNIQUE: Multidetector CT imaging of the abdomen and pelvis was performed using the standard protocol following bolus administration of intravenous contrast. CONTRAST:  46mL OMNIPAQUE IOHEXOL 300 MG/ML  SOLN COMPARISON:  Radiograph earlier this day. Abdominopelvic CT 11/22/2019 FINDINGS: Lower chest: No basilar consolidation or pleural effusion. There are coronary artery calcifications. Fluid distending the distal esophagus. Hepatobiliary: Unchanged low-density lesions in the right lobe of the liver. No new hepatic lesion. Gallbladder physiologically distended, no calcified stone. No biliary dilatation. Pancreas: Parenchymal atrophy. No ductal dilatation or inflammation. Spleen: Normal in size without focal abnormality. Adrenals/Urinary Tract: No adrenal nodule. No hydronephrosis. Tiny  cortical hypodensity in the upper left mid lower right kidney too small to characterize but likely small cysts. No significant perinephric edema. No renal or ureteral calculi. There is absent renal excretion on delayed phase imaging. Nondistended but thick walled urinary bladder. Stomach/Bowel: Fluid distending the distal esophagus. Fluid distending the stomach. No gastric wall thickening. Small bowel loops are diffusely fluid-filled but no transition point. There is fecalization of small bowel contents involving the distal ileum. No definite wall thickening or perienteric edema. Normal appendix. Mild colonic distension with liquid and solid stool. There is formed stool in the sigmoid colon. Stool distends the rectum with rectal distention of 5.1 cm. No perirectal inflammation. No definite colonic wall thickening. Vascular/Lymphatic: Advanced aortic and branch atherosclerosis. Ectatic infrarenal aorta at 2.5 cm. Ectatic common iliac arteries with moderate atherosclerosis. The portal vein is patent. No acute vascular findings are seen. 12 mm left inguinal node, series 2, image 80, new from prior exam. Scattered additional prominent bilateral inguinal and external iliac nodes. No retroperitoneal, upper abdominal, or more mesenteric adenopathy. 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 Other: Trace free fluid in the dependent pelvis is likely reactive. There is no upper abdominal ascites. No free air or intra-abdominal abscess. Tiny fat containing umbilical hernia. Musculoskeletal: Advanced degenerative change throughout the thoracic spine. T12 superior endplate compression fracture is unchanged from prior. IMPRESSION: 1. Fluid distending the distal esophagus, stomach, and small bowel loops with fecalization of small bowel contents. Mixed liquid and solid stool throughout the colon, with formed stool distending the rectum. Query fecal impaction. Findings most consistent with generalized slow  transit/constipation. No evidence of bowel obstruction or inflammation. 2. Nondistended but thick walled urinary bladder, can be seen with cystitis. Recommend correlation with urinalysis. 3. Enlarged left inguinal node is new from prior exam, nonspecific. 4. Absent renal excretion on delayed phase imaging suggests underlying renal dysfunction. Aortic Atherosclerosis (ICD10-I70.0). Electronically Signed   By: Keith Rake M.D.   On: 03/31/2020 22:07   DG Abdomen Acute W/Chest  Result Date: 03/31/2020 CLINICAL DATA:  Hypotension and tachycardia. Evaluate for bowel obstruction. EXAM: DG ABDOMEN  ACUTE W/ 1V CHEST COMPARISON:  11/23/2019 chest radiograph FINDINGS: Frontal view of the chest demonstrates midline trachea. Normal heart size. Atherosclerosis in the transverse aorta. No pleural effusion or pneumothorax. Clear lungs. Abdominal films demonstrate gas and stool filled colon, including up to 6.8 cm. Stool within the rectum at up to 6.0 cm. No small bowel dilatation. No abnormal abdominal calcifications. No appendicolith. IMPRESSION: Moderate amount of stool within the rectum, possibly representing constipation or fecal impaction. Borderline gaseous distension of the colon without small bowel obstruction. No acute process in the chest. Aortic Atherosclerosis (ICD10-I70.0). Electronically Signed   By: Abigail Miyamoto M.D.   On: 03/31/2020 19:30      Subjective: Pt tolerated procedures well, now eating and drinking, no complaints.    Discharge Exam: Vitals:   04/09/20 2018 04/10/20 0600  BP: (!) 143/88 (!) 154/73  Pulse: 93 82  Resp: 20 15  Temp: 98.7 F (37.1 C) 98.7 F (37.1 C)  SpO2: 99% 94%   Vitals:   04/09/20 1440 04/09/20 1500 04/09/20 2018 04/10/20 0600  BP: (!) 146/80 (!) 148/84 (!) 143/88 (!) 154/73  Pulse: 80 79 93 82  Resp: 19 18 20 15   Temp:   98.7 F (37.1 C) 98.7 F (37.1 C)  TempSrc:      SpO2: 97% 95% 99% 94%  Weight:      Height:       General exam: Appears calm  and comfortable  Respiratory system: Clear to auscultation. Respiratory effort normal. Cardiovascular system: normal S1 & S2 heard.  Gastrointestinal system: Abdomen is nondistended, soft and nontender. Central nervous system: Alert and oriented. No focal neurological deficits. Extremities: Symmetric 5 x 5 power. Skin: No rashes, lesions or ulcers Psychiatry: alert, oriented x3, normal affect.   The results of significant diagnostics from this hospitalization (including imaging, microbiology, ancillary and laboratory) are listed below for reference.     Microbiology: Recent Results (from the past 240 hour(s))  Blood culture (routine single)     Status: Abnormal   Collection Time: 03/31/20  7:00 PM   Specimen: Left Antecubital; Blood  Result Value Ref Range Status   Specimen Description   Final    LEFT ANTECUBITAL Performed at [facility], [address], [place], [place] [address]    Special Requests   Final    BOTTLES DRAWN AEROBIC AND ANAEROBIC Blood Culture adequate volume Performed at [place] [place], [place] [address]    Culture  Setup Time   Final    GRAM NEGATIVE RODS ANAEROBIC BOTTLE Gram Stain Report Called to,Read Back By and Verified With: MURPHY,[id number]  BY MATTHEWS, B 9.7.21 [facility] GRAM STAIN REVIEWED-AGREE WITH RESULT    Culture (A)  Final    FUSOBACTERIUM NUCLEATUM BETA LACTAMASE NEGATIVE Performed at [facility], [place] [address] [place], [place] [address]    Report Status 04/04/2020 FINAL  Final  SARS Coronavirus 2 by RT PCR (hospital order, performed in [facility] hospital lab) Nasopharyngeal Nasopharyngeal Swab     Status: None   Collection Time: 03/31/20  7:00 PM   Specimen: Nasopharyngeal Swab  Result Value Ref Range Status   SARS Coronavirus 2 NEGATIVE NEGATIVE Final    Comment: (NOTE) SARS-CoV-2 target nucleic acids are NOT DETECTED.  The SARS-CoV-2 RNA is generally detectable in upper and lower respiratory  specimens during the acute phase of infection. The lowest concentration of SARS-CoV-2 viral copies this assay can detect is 250 copies / mL. A negative result does  not preclude SARS-CoV-2 infection and should not be used as the sole basis for treatment or other patient management decisions.  A negative result may occur with improper specimen collection / handling, submission of specimen other than nasopharyngeal swab, presence of viral mutation(s) within the areas targeted by this assay, and inadequate number of viral copies (<250 copies / mL). A negative result must be combined with clinical observations, patient history, and epidemiological information.  Fact Sheet for Patients:   StrictlyIdeas.no  Fact Sheet for Healthcare Providers: BankingDealers.co.za  This test is not yet approved or  cleared by the [place] FDA and has been authorized for detection and/or diagnosis of SARS-CoV-2 by FDA under an Emergency Use Authorization (EUA).  This EUA will remain in effect (meaning this test can be used) for the duration of the COVID-19 declaration under Section 564(b)(1) of the Act, 21 U.S.C. section 360bbb-3(b)(1), unless the authorization is terminated or revoked sooner.  Performed at [facility], [address], [place], [place] [address]   Blood Culture ID Panel (Reflexed)     Status: None   Collection Time: 03/31/20  7:00 PM  Result Value Ref Range Status   Enterococcus faecalis NOT DETECTED NOT DETECTED Final   Enterococcus Faecium NOT DETECTED NOT DETECTED Final   Listeria monocytogenes NOT DETECTED NOT DETECTED Final   Staphylococcus species NOT DETECTED NOT DETECTED Final   Staphylococcus aureus (BCID) NOT DETECTED NOT DETECTED Final   Staphylococcus epidermidis NOT DETECTED NOT DETECTED Final   Staphylococcus lugdunensis NOT DETECTED NOT DETECTED Final   Streptococcus species NOT DETECTED NOT DETECTED Final   Streptococcus  agalactiae NOT DETECTED NOT DETECTED Final   Streptococcus pneumoniae NOT DETECTED NOT DETECTED Final   Streptococcus pyogenes NOT DETECTED NOT DETECTED Final   A.calcoaceticus-baumannii NOT DETECTED NOT DETECTED Final   Bacteroides fragilis NOT DETECTED NOT DETECTED Final   Enterobacterales NOT DETECTED NOT DETECTED Final   Enterobacter cloacae complex NOT DETECTED NOT DETECTED Final   Escherichia coli NOT DETECTED NOT DETECTED Final   Klebsiella aerogenes NOT DETECTED NOT DETECTED Final   Klebsiella oxytoca NOT DETECTED NOT DETECTED Final   Klebsiella pneumoniae NOT DETECTED NOT DETECTED Final   Proteus species NOT DETECTED NOT DETECTED Final   Salmonella species NOT DETECTED NOT DETECTED Final   Serratia marcescens NOT DETECTED NOT DETECTED Final   Haemophilus influenzae NOT DETECTED NOT DETECTED Final   Neisseria meningitidis NOT DETECTED NOT DETECTED Final   Pseudomonas aeruginosa NOT DETECTED NOT DETECTED Final   Stenotrophomonas maltophilia NOT DETECTED NOT DETECTED Final   Candida albicans NOT DETECTED NOT DETECTED Final   Candida auris NOT DETECTED NOT DETECTED Final   Candida glabrata NOT DETECTED NOT DETECTED Final   Candida krusei NOT DETECTED NOT DETECTED Final   Candida parapsilosis NOT DETECTED NOT DETECTED Final   Candida tropicalis NOT DETECTED NOT DETECTED Final   Cryptococcus neoformans/gattii NOT DETECTED NOT DETECTED Final    Comment: Performed at [facility], [address], [place], [place] [address]  Culture, blood (single)     Status: None   Collection Time: 03/31/20  8:55 PM   Specimen: BLOOD  Result Value Ref Range Status   Specimen Description BLOOD RIGHT ANTECUBITAL  Final   Special Requests   Final    BOTTLES DRAWN AEROBIC AND ANAEROBIC Blood Culture adequate volume   Culture   Final    NO GROWTH 5 DAYS Performed at [facility], [address] [place], [place] [address]    Report Status 04/05/2020  FINAL  Final  Urine culture     Status:  Abnormal   Collection Time: 04/01/20 12:44 AM   Specimen: In/Out Cath Urine  Result Value Ref Range Status   Specimen Description   Final    IN/OUT CATH URINE Performed at [facility], [address] [place], [place] [address]    Special Requests   Final    NONE Performed at [facility], [address] [place], [place] [address]    Culture (A)  Final    <10,000 COLONIES/mL INSIGNIFICANT GROWTH Performed at [facility] [address], [place], [place] [address]    Report Status 04/02/2020 FINAL  Final  MRSA PCR Screening     Status: None   Collection Time: 04/01/20  1:54 AM   Specimen: Nasopharyngeal  Result Value Ref Range Status   MRSA by PCR NEGATIVE NEGATIVE Final    Comment:        The GeneXpert MRSA Assay (FDA approved for NASAL specimens only), is one component of a comprehensive MRSA colonization surveillance program. It is not intended to diagnose MRSA infection nor to guide or monitor treatment for MRSA infections. Performed at [facility], [address] [place], [place] [address]   C Difficile Quick Screen w PCR reflex     Status: None   Collection Time: 04/02/20  1:14 PM   Specimen: Stool  Result Value Ref Range Status   C Diff antigen NEGATIVE NEGATIVE Final   C Diff toxin NEGATIVE NEGATIVE Final   C Diff interpretation No C. difficile detected.  Final    Comment: Performed at [facility], [address], [place], [place] [address]  Gastrointestinal Panel by PCR , Stool     Status: None   Collection Time: 04/02/20  1:14 PM   Specimen: Stool  Result Value Ref Range Status   Campylobacter species NOT DETECTED NOT DETECTED Final   Plesimonas shigelloides NOT DETECTED NOT DETECTED Final   Salmonella species NOT DETECTED NOT DETECTED Final   Yersinia enterocolitica NOT DETECTED NOT DETECTED Final   Vibrio species NOT DETECTED NOT DETECTED Final   Vibrio cholerae NOT DETECTED NOT DETECTED Final   Enteroaggregative E coli (EAEC) NOT DETECTED NOT  DETECTED Final   Enteropathogenic E coli (EPEC) NOT DETECTED NOT DETECTED Final   Enterotoxigenic E coli (ETEC) NOT DETECTED NOT DETECTED Final   Shiga like toxin producing E coli (STEC) NOT DETECTED NOT DETECTED Final   Shigella/Enteroinvasive E coli (EIEC) NOT DETECTED NOT DETECTED Final   Cryptosporidium NOT DETECTED NOT DETECTED Final   Cyclospora cayetanensis NOT DETECTED NOT DETECTED Final   Entamoeba histolytica NOT DETECTED NOT DETECTED Final   Giardia lamblia NOT DETECTED NOT DETECTED Final   Adenovirus F40/41 NOT DETECTED NOT DETECTED Final   Astrovirus NOT DETECTED NOT DETECTED Final   Norovirus GI/GII NOT DETECTED NOT DETECTED Final   Rotavirus A NOT DETECTED NOT DETECTED Final   Sapovirus (I, II, IV, and V) NOT DETECTED NOT DETECTED Final    Comment: Performed at [facility], [address] [place] [address] [place], [place] [address]  SARS Coronavirus 2 by RT PCR (hospital order, performed in [place] hospital lab) Nasopharyngeal Nasopharyngeal Swab     Status: None   Collection Time: 04/10/20 10:36 AM   Specimen: Nasopharyngeal Swab  Result Value Ref Range Status   SARS Coronavirus 2 NEGATIVE NEGATIVE Final    Comment: (NOTE) SARS-CoV-2 target nucleic acids are NOT DETECTED.  The SARS-CoV-2 RNA is generally detectable in upper and  lower respiratory specimens during the acute phase of infection. The lowest concentration of SARS-CoV-2 viral copies this assay can detect is 250 copies / mL. A negative result does not preclude SARS-CoV-2 infection and should not be used as the sole basis for treatment or other patient management decisions.  A negative result may occur with improper specimen collection / handling, submission of specimen other than nasopharyngeal swab, presence of viral mutation(s) within the areas targeted by this assay, and inadequate number of viral copies (<250 copies / mL). A negative result must be combined with clinical observations, patient history, and  epidemiological information.  Fact Sheet for Patients:   StrictlyIdeas.no  Fact Sheet for Healthcare Providers: BankingDealers.co.za  This test is not yet approved or  cleared by the [place] FDA and has been authorized for detection and/or diagnosis of SARS-CoV-2 by FDA under an Emergency Use Authorization (EUA).  This EUA will remain in effect (meaning this test can be used) for the duration of the COVID-19 declaration under Section 564(b)(1) of the Act, 21 U.S.C. section 360bbb-3(b)(1), unless the authorization is terminated or revoked sooner.  Performed at [facility], [address] [place], [place] [address]      Labs: BNP (last 3 results) No results for input(s): BNP in the last 8760 hours. Basic Metabolic Panel: Recent Labs  Lab 04/05/20 0756 04/06/20 0854 04/07/20 0911 04/08/20 0553 04/09/20 0556  NA 134* 130* 129* 130* 129*  K 4.3 4.1 3.9 3.6 3.8  CL 101 99 95* 96* 95*  CO2 23 23 25 24 22   GLUCOSE 94 120* 108* 93 83  BUN 13 13 15 15 14   CREATININE 0.87 0.79 0.77 0.77 0.72  CALCIUM 8.2* 7.9* 7.9* 7.9* 8.0*  MG 1.5* 1.6* 1.7 1.6* 1.7   Liver Function Tests: No results for input(s): AST, ALT, ALKPHOS, BILITOT, PROT, ALBUMIN in the last 168 hours. No results for input(s): LIPASE, AMYLASE in the last 168 hours. No results for input(s): AMMONIA in the last 168 hours. CBC: Recent Labs  Lab 04/04/20 0417 04/04/20 0417 04/05/20 0756 04/06/20 0854 04/07/20 0911 04/08/20 0553 04/09/20 0556  WBC 18.7*   < > 19.2* 18.3* 17.2* 17.1* 14.4*  NEUTROABS 15.3*  --  15.9* 14.3* 13.7*  --   --   HGB 9.1*   < > 10.1* 10.6* 10.3* 9.8* 9.9*  HCT 28.3*   < > 31.2* 33.0* 32.1* 30.8* 31.4*  MCV 84.5   < > 84.8 84.4 84.9 85.8 85.3  PLT 431*   < > 456* 475* 432* 456* 466*   < > = values in this interval not displayed.   Cardiac Enzymes: No results for input(s): CKTOTAL, CKMB, CKMBINDEX, TROPONINI in the last 168  hours. BNP: Invalid input(s): POCBNP CBG: No results for input(s): GLUCAP in the last 168 hours. D-Dimer No results for input(s): DDIMER in the last 72 hours. Hgb A1c No results for input(s): HGBA1C in the last 72 hours. Lipid Profile No results for input(s): CHOL, HDL, LDLCALC, TRIG, CHOLHDL, LDLDIRECT in the last 72 hours. Thyroid function studies No results for input(s): TSH, T4TOTAL, T3FREE, THYROIDAB in the last 72 hours.  Invalid input(s): FREET3 Anemia work up No results for input(s): VITAMINB12, FOLATE, FERRITIN, TIBC, IRON, RETICCTPCT in the last 72 hours. Urinalysis    Component Value Date/Time   COLORURINE YELLOW 04/01/2020 0044   APPEARANCEUR HAZY (A) 04/01/2020 0044   LABSPEC 1.017 04/01/2020 0044   PHURINE 7.0 04/01/2020 0044   GLUCOSEU 50 (A) 04/01/2020 0044  HGBUR SMALL (A) 04/01/2020 0044   BILIRUBINUR NEGATIVE 04/01/2020 0044   KETONESUR NEGATIVE 04/01/2020 0044   PROTEINUR 30 (A) 04/01/2020 0044   UROBILINOGEN 0.2 01/26/2015 1045   NITRITE NEGATIVE 04/01/2020 0044   LEUKOCYTESUR NEGATIVE 04/01/2020 0044   Sepsis Labs Invalid input(s): PROCALCITONIN,  WBC,  LACTICIDVEN Microbiology Recent Results (from the past 240 hour(s))  Blood culture (routine single)     Status: Abnormal   Collection Time: 03/31/20  7:00 PM   Specimen: Left Antecubital; Blood  Result Value Ref Range Status   Specimen Description   Final    LEFT ANTECUBITAL Performed at [facility], [address] [place], [place] [address]    Special Requests   Final    BOTTLES DRAWN AEROBIC AND ANAEROBIC Blood Culture adequate volume Performed at [facility], [address], [place], [place] [address]    Culture  Setup Time   Final    GRAM NEGATIVE RODS ANAEROBIC BOTTLE Gram Stain Report Called to,Read Back By and Verified With: [email or web address]  BY MATTHEWS, B 9.7.21  HOSP GRAM STAIN REVIEWED-AGREE WITH RESULT    Culture (A)  Final    FUSOBACTERIUM NUCLEATUM BETA LACTAMASE  NEGATIVE Performed at [facility], [place] [address] [place], [place] [address]    Report Status 04/04/2020 FINAL  Final  SARS Coronavirus 2 by RT PCR (hospital order, performed in [facility] hospital lab) Nasopharyngeal Nasopharyngeal Swab     Status: None   Collection Time: 03/31/20  7:00 PM   Specimen: Nasopharyngeal Swab  Result Value Ref Range Status   SARS Coronavirus 2 NEGATIVE NEGATIVE Final    Comment: (NOTE) SARS-CoV-2 target nucleic acids are NOT DETECTED.  The SARS-CoV-2 RNA is generally detectable in upper and lower respiratory specimens during the acute phase of infection. The lowest concentration of SARS-CoV-2 viral copies this assay can detect is 250 copies / mL. A negative result does not preclude SARS-CoV-2 infection and should not be used as the sole basis for treatment or other patient management decisions.  A negative result may occur with improper specimen collection / handling, submission of specimen other than nasopharyngeal swab, presence of viral mutation(s) within the areas targeted by this assay, and inadequate number of viral copies (<250 copies / mL). A negative result must be combined with clinical observations, patient history, and epidemiological information.  Fact Sheet for Patients:   StrictlyIdeas.no  Fact Sheet for Healthcare Providers: BankingDealers.co.za  This test is not yet approved or  cleared by the [place] FDA and has been authorized for detection and/or diagnosis of SARS-CoV-2 by FDA under an Emergency Use Authorization (EUA).  This EUA will remain in effect (meaning this test can be used) for the duration of the COVID-19 declaration under Section 564(b)(1) of the Act, 21 U.S.C. section 360bbb-3(b)(1), unless the authorization is terminated or revoked sooner.  Performed at [facility], [address], [place], [place] [address]   Blood Culture ID Panel (Reflexed)     Status:  None   Collection Time: 03/31/20  7:00 PM  Result Value Ref Range Status   Enterococcus faecalis NOT DETECTED NOT DETECTED Final   Enterococcus Faecium NOT DETECTED NOT DETECTED Final   Listeria monocytogenes NOT DETECTED NOT DETECTED Final   Staphylococcus species NOT DETECTED NOT DETECTED Final   Staphylococcus aureus (BCID) NOT DETECTED NOT DETECTED Final   Staphylococcus epidermidis NOT DETECTED NOT DETECTED Final   Staphylococcus lugdunensis NOT DETECTED NOT DETECTED Final   Streptococcus species NOT DETECTED NOT DETECTED Final  Streptococcus agalactiae NOT DETECTED NOT DETECTED Final   Streptococcus pneumoniae NOT DETECTED NOT DETECTED Final   Streptococcus pyogenes NOT DETECTED NOT DETECTED Final   A.calcoaceticus-baumannii NOT DETECTED NOT DETECTED Final   Bacteroides fragilis NOT DETECTED NOT DETECTED Final   Enterobacterales NOT DETECTED NOT DETECTED Final   Enterobacter cloacae complex NOT DETECTED NOT DETECTED Final   Escherichia coli NOT DETECTED NOT DETECTED Final   Klebsiella aerogenes NOT DETECTED NOT DETECTED Final   Klebsiella oxytoca NOT DETECTED NOT DETECTED Final   Klebsiella pneumoniae NOT DETECTED NOT DETECTED Final   Proteus species NOT DETECTED NOT DETECTED Final   Salmonella species NOT DETECTED NOT DETECTED Final   Serratia marcescens NOT DETECTED NOT DETECTED Final   Haemophilus influenzae NOT DETECTED NOT DETECTED Final   Neisseria meningitidis NOT DETECTED NOT DETECTED Final   Pseudomonas aeruginosa NOT DETECTED NOT DETECTED Final   Stenotrophomonas maltophilia NOT DETECTED NOT DETECTED Final   Candida albicans NOT DETECTED NOT DETECTED Final   Candida auris NOT DETECTED NOT DETECTED Final   Candida glabrata NOT DETECTED NOT DETECTED Final   Candida krusei NOT DETECTED NOT DETECTED Final   Candida parapsilosis NOT DETECTED NOT DETECTED Final   Candida tropicalis NOT DETECTED NOT DETECTED Final   Cryptococcus neoformans/gattii NOT DETECTED NOT DETECTED  Final    Comment: Performed at [facility], [address], [place], [place] [address]  Culture, blood (single)     Status: None   Collection Time: 03/31/20  8:55 PM   Specimen: BLOOD  Result Value Ref Range Status   Specimen Description BLOOD RIGHT ANTECUBITAL  Final   Special Requests   Final    BOTTLES DRAWN AEROBIC AND ANAEROBIC Blood Culture adequate volume   Culture   Final    NO GROWTH 5 DAYS Performed at [facility], [address] [place], [place] [address]    Report Status 04/05/2020 FINAL  Final  Urine culture     Status: Abnormal   Collection Time: 04/01/20 12:44 AM   Specimen: In/Out Cath Urine  Result Value Ref Range Status   Specimen Description   Final    IN/OUT CATH URINE Performed at [facility], [address], [place], [place] [address]    Special Requests   Final    NONE Performed at [facility], [address] [place], [place] [address]    Culture (A)  Final    <10,000 COLONIES/mL INSIGNIFICANT GROWTH Performed at [facility], [place] [address], [place], [place] [address]    Report Status 04/02/2020 FINAL  Final  MRSA PCR Screening     Status: None   Collection Time: 04/01/20  1:54 AM   Specimen: Nasopharyngeal  Result Value Ref Range Status   MRSA by PCR NEGATIVE NEGATIVE Final    Comment:        The GeneXpert MRSA Assay (FDA approved for NASAL specimens only), is one component of a comprehensive MRSA colonization surveillance program. It is not intended to diagnose MRSA infection nor to guide or monitor treatment for MRSA infections. Performed at [facility], [address], [place], [place] [address]   C Difficile Quick Screen w PCR reflex     Status: None   Collection Time: 04/02/20  1:14 PM   Specimen: Stool  Result Value Ref Range Status   C Diff antigen NEGATIVE NEGATIVE Final   C Diff toxin NEGATIVE NEGATIVE Final   C Diff interpretation No C. difficile detected.  Final    Comment: Performed  at [facility], [address], [place], [place] [address]  Gastrointestinal Panel by PCR , Stool     Status: None   Collection Time: 04/02/20  1:14 PM   Specimen: Stool  Result Value Ref Range Status   Campylobacter species NOT DETECTED NOT DETECTED Final   Plesimonas shigelloides NOT DETECTED NOT DETECTED Final   Salmonella species NOT DETECTED NOT DETECTED Final   Yersinia enterocolitica NOT DETECTED NOT DETECTED Final   Vibrio species NOT DETECTED NOT DETECTED Final   Vibrio cholerae NOT DETECTED NOT DETECTED Final   Enteroaggregative E coli (EAEC) NOT DETECTED NOT DETECTED Final   Enteropathogenic E coli (EPEC) NOT DETECTED NOT DETECTED Final   Enterotoxigenic E coli (ETEC) NOT DETECTED NOT DETECTED Final   Shiga like toxin producing E coli (STEC) NOT DETECTED NOT DETECTED Final   Shigella/Enteroinvasive E coli (EIEC) NOT DETECTED NOT DETECTED Final   Cryptosporidium NOT DETECTED NOT DETECTED Final   Cyclospora cayetanensis NOT DETECTED NOT DETECTED Final   Entamoeba histolytica NOT DETECTED NOT DETECTED Final   Giardia lamblia NOT DETECTED NOT DETECTED Final   Adenovirus F40/41 NOT DETECTED NOT DETECTED Final   Astrovirus NOT DETECTED NOT DETECTED Final   Norovirus GI/GII NOT DETECTED NOT DETECTED Final   Rotavirus A NOT DETECTED NOT DETECTED Final   Sapovirus (I, II, IV, and V) NOT DETECTED NOT DETECTED Final    Comment: Performed at [facility], [place], [address] [place], [place] [address]  SARS Coronavirus 2 by RT PCR (hospital order, performed in [place] hospital lab) Nasopharyngeal Nasopharyngeal Swab     Status: None   Collection Time: 04/10/20 10:36 AM   Specimen: Nasopharyngeal Swab  Result Value Ref Range Status   SARS Coronavirus 2 NEGATIVE NEGATIVE Final    Comment: (NOTE) SARS-CoV-2 target nucleic acids are NOT DETECTED.  The SARS-CoV-2 RNA is generally detectable in upper and lower respiratory specimens during the acute phase of infection. The lowest concentration of  SARS-CoV-2 viral copies this assay can detect is 250 copies / mL. A negative result does not preclude SARS-CoV-2 infection and should not be used as the sole basis for treatment or other patient management decisions.  A negative result may occur with improper specimen collection / handling, submission of specimen other than nasopharyngeal swab, presence of viral mutation(s) within the areas targeted by this assay, and inadequate number of viral copies (<250 copies / mL). A negative result must be combined with clinical observations, patient history, and epidemiological information.  Fact Sheet for Patients:   StrictlyIdeas.no  Fact Sheet for Healthcare Providers: BankingDealers.co.za  This test is not yet approved or  cleared by the [place] FDA and has been authorized for detection and/or diagnosis of SARS-CoV-2 by FDA under an Emergency Use Authorization (EUA).  This EUA will remain in effect (meaning this test can be used) for the duration of the COVID-19 declaration under Section 564(b)(1) of the Act, 21 U.S.C. section 360bbb-3(b)(1), unless the authorization is terminated or revoked sooner.  Performed at [facility], [address], [place], [place] [address]    Time coordinating discharge: 33 mins  SIGNED:  Irwin Brakeman, MD  [facility] 04/10/2020, 2:44 PM How to contact the [facility] Attending or Consulting provider [place] or covering provider during after hours [place], for this patient?  1. Check the care team in [facility] and look for a) attending/consulting [facility] provider listed and b) the [facility] team listed 2. Log into [email or web address] and use [place]'s  universal password to access. If you do not have the password, please contact the hospital operator. 3. Locate the [facility] provider you are looking for under [facility] and page to a number that you can be directly reached. 4. If you still have difficulty reaching the  provider, please page the [facility] (Director on Call) for the Hospitalists listed on amion for assistance.

## 2020-04-10 NOTE — Progress Notes (Signed)
PROGRESS NOTE    Nathan [name]  WLN:[id number] DOB: [birth year] DOA: 03/31/2020 PCP: Patient, No Pcp Per   Brief Narrative:  Per HPI: Nathan [name] a 74 y.o.malewith medical history significant ofDVT, GERD, hypertension, hyperlipidemia, PTSD, chronic renal insufficiency, history of seizures (last one was in 2004) who is coming to the emergency department due toprogressively worse abdominal pain associated with constipation, decreased oral intake, fatigue, malaise and postural dizziness for the past 3 days. He also had 2 episodes of nonbloody emesis in the past 24 hours. He denies melena or hematochezia. No dysuria, frequency or hematuria. However, he states that he has been urinating less than usual. He denies fever, chills, night sweats, rhinorrhea, sore throat, dyspnea, wheezing or hemoptysis. No chest pain, palpitations, diaphoresis, PND, orthopnea or recent pitting edema of the lower extremities. He denies polyuria, polydipsia, polyphagia or blurred vision.  9/8-9/14: Patient was initially admitted with sepsis of unknown cause and was noted to have some abdominal pain and diarrhea which have now resolved.  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  He was noted to have an episode of acute anemia as well requiring 2 unit PRBC transfusion and currently remained stable with no overt bleeding, however he was noted to be positive for Hemoccult and is noted to be on Eliquis at home for prior history of DVT.  This has currently been held and GI is anticipating upper and lower endoscopy.  9/15: Pt has been fully prepped for EGD/colon today.   9/16: Pt tolerated EGD/colon well and now eating and drinking, per GI can resume apixaban on 04/14/20.   Awaiting SNF bed.    Assessment & Plan:   Principal Problem:   Sepsis due to undetermined organism [facility]) Active Problems:   Seizure ([place])   HTN (hypertension)   GERD (gastroesophageal  reflux disease)   Hyponatremia   Lactic acidosis   AKI (acute kidney injury) ([place])   Normocytic anemia   Thrombocytosis (HCC)   Hyperlipidemia   Hyperglycemia   Heme positive stool   Segmental colitis without complication (HCC)  Sepsis on admissionwith Fusobacterium bacteremia -Patient now with diarrheaand C. difficile testing negative. GI panelnegative -Urine cultures with insignificant growth -Continue current antibiotics. Treated with Unasyn, now transition to oral augmentin to complete full 14 day course.  Added probiotics.   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 -Fluid restriction continued -TSH 4.0  Seizure history -Continue oxycarbamazepine -Continue Sinemet  Hypertension -Hold lisinopril and start metoprolol -Labetalol prn  GERD -PPI  Significant leukocytosis likelyleukemoid reaction -Appreciate hematology evaluation; can follow up outpatient at this point -Continue current antibiotics as ordered  Dyslipidemia -Resume home statin  History of prior DVT -Hold Eliquis for now until endoscopy -Plan to resume on 9/20 per GI recommendations  Mild hypomagnesemia -Repleted IV  DVT prophylaxis:SCDs Code Status:Full Family Communication:Discussed withwife 9/14, 9/16 Disposition Plan: Status is: Inpatient  Remains inpatient appropriate because:IV treatments appropriate due to intensity of illness or inability to  take PO and Inpatient level of care appropriate due to severity of illness  Dispo: The patient is from:Home Anticipated d/c is to:SNF Anticipated d/c date is:1days Patient currently is medically stable to d/c.  Awaiting SNF authorization.   Consultants:  Heme/Onc  GI  Procedures:  See below  Antimicrobials:  Anti-infectives (From admission, onward)   Start     Dose/Rate Route Frequency Ordered Stop   04/11/20 1000  amoxicillin-clavulanate (AUGMENTIN) 875-125 MG per tablet 1 tablet        1 tablet Oral Every 12 hours 04/10/20 1410     04/04/20 1800  Ampicillin-Sulbactam (UNASYN) 3 g in sodium chloride 0.9 % 100 mL IVPB        3 g 200 mL/hr over 30 Minutes Intravenous Every 6 hours 04/04/20 1420 04/10/20 2359   04/03/20 1000  ceFEPIme (MAXIPIME) 2 g in sodium chloride 0.9 % 100 mL IVPB  Status:  Discontinued        2 g 200 mL/hr over 30 Minutes Intravenous Every 8 hours 04/03/20 0909 04/04/20 1420   04/01/20 0830  vancomycin (VANCOREADY) IVPB 750 mg/150 mL  Status:  Discontinued        750 mg 150 mL/hr over 60 Minutes Intravenous Every 12 hours 03/31/20 2024 04/01/20 1004   04/01/20 0400  metroNIDAZOLE (FLAGYL) IVPB 500 mg  Status:  Discontinued        500 mg 100 mL/hr over 60 Minutes Intravenous Every 8 hours 04/01/20 0026 04/04/20 1420   03/31/20 2030  ceFEPIme (MAXIPIME) 2 g in sodium chloride 0.9 % 100 mL IVPB  Status:  Discontinued        2 g 200 mL/hr over 30 Minutes Intravenous Every 12 hours 03/31/20 2024 04/03/20 0909   03/31/20 2015  vancomycin (VANCOREADY) IVPB 2000 mg/400 mL        2,000 mg 200 mL/hr over 120 Minutes Intravenous  Once 03/31/20 2002 04/01/20 0027   03/31/20 2000  ceFEPIme (MAXIPIME) 2 g in sodium chloride 0.9 % 100 mL IVPB  Status:  Discontinued        2 g 200 mL/hr over 30 Minutes Intravenous  Once 03/31/20 1954 03/31/20 2024   03/31/20 2000  metroNIDAZOLE (FLAGYL) IVPB 500 mg        500 mg 100  mL/hr over 60 Minutes Intravenous  Once 03/31/20 1954 03/31/20 2159   03/31/20 2000  vancomycin (VANCOCIN) IVPB 1000 mg/200 mL premix  Status:  Discontinued        1,000 mg 200 mL/hr over 60 Minutes Intravenous  Once 03/31/20 1954 03/31/20 2002     Subjective: Pt tolerated procedures well, now eating and drinking, no complaints.    Objective: Vitals:   04/09/20 1440 04/09/20 1500 04/09/20 2018 04/10/20 0600  BP: (!) 146/80 (!) 148/84 (!) 143/88 (!) 154/73  Pulse: 80 79 93 82  Resp: 19 18 20 15   Temp:   98.7 F (37.1 C) 98.7 F (37.1 C)  TempSrc:      SpO2: 97% 95% 99% 94%  Weight:      Height:        Intake/Output Summary (Last 24 hours) at 04/10/2020 1416 Last data filed at 04/10/2020 1300 Gross per 24 hour  Intake 490 ml  Output 1100 ml  Net -610 ml   Filed Weights   04/01/20 0206 04/02/20 0500 04/02/20 1930  Weight: 86 kg 86 kg 86.4 kg   Examination:  General exam: Appears calm and comfortable  Respiratory system: Clear to auscultation. Respiratory effort normal. Cardiovascular system: normal S1 & S2 heard.  Gastrointestinal system: Abdomen is nondistended, soft  and nontender. Central nervous system: Alert and oriented. No focal neurological deficits. Extremities: Symmetric 5 x 5 power. Skin: No rashes, lesions or ulcers Psychiatry: alert, oriented x3, normal affect.  Data Reviewed: I have personally reviewed following labs and imaging studies  CBC: Recent Labs  Lab 04/04/20 0417 04/04/20 0417 04/05/20 0756 04/06/20 0854 04/07/20 0911 04/08/20 0553 04/09/20 0556  WBC 18.7*   < > 19.2* 18.3* 17.2* 17.1* 14.4*  NEUTROABS 15.3*  --  15.9* 14.3* 13.7*  --   --   HGB 9.1*   < > 10.1* 10.6* 10.3* 9.8* 9.9*  HCT 28.3*   < > 31.2* 33.0* 32.1* 30.8* 31.4*  MCV 84.5   < > 84.8 84.4 84.9 85.8 85.3  PLT 431*   < > 456* 475* 432* 456* 466*   < > = values in this interval not displayed.   Basic Metabolic Panel: Recent Labs  Lab 04/05/20 0756 04/06/20 0854  04/07/20 0911 04/08/20 0553 04/09/20 0556  NA 134* 130* 129* 130* 129*  K 4.3 4.1 3.9 3.6 3.8  CL 101 99 95* 96* 95*  CO2 23 23 25 24 22   GLUCOSE 94 120* 108* 93 83  BUN 13 13 15 15 14   CREATININE 0.87 0.79 0.77 0.77 0.72  CALCIUM 8.2* 7.9* 7.9* 7.9* 8.0*  MG 1.5* 1.6* 1.7 1.6* 1.7   GFR: Estimated Creatinine Clearance: 85 mL/min (by C-G formula based on SCr of 0.72 mg/dL). Liver Function Tests: No results for input(s): AST, ALT, ALKPHOS, BILITOT, PROT, ALBUMIN in the last 168 hours. No results for input(s): LIPASE, AMYLASE in the last 168 hours. No results for input(s): AMMONIA in the last 168 hours. Coagulation Profile: No results for input(s): INR, PROTIME in the last 168 hours. Cardiac Enzymes: No results for input(s): CKTOTAL, CKMB, CKMBINDEX, TROPONINI in the last 168 hours. BNP (last 3 results) No results for input(s): PROBNP in the last 8760 hours. HbA1C: No results for input(s): HGBA1C in the last 72 hours. CBG: No results for input(s): GLUCAP in the last 168 hours. Lipid Profile: No results for input(s): CHOL, HDL, LDLCALC, TRIG, CHOLHDL, LDLDIRECT in the last 72 hours. Thyroid Function Tests: No results for input(s): TSH, T4TOTAL, FREET4, T3FREE, THYROIDAB in the last 72 hours. Anemia Panel: No results for input(s): VITAMINB12, FOLATE, FERRITIN, TIBC, IRON, RETICCTPCT in the last 72 hours. Sepsis Labs: Recent Labs  Lab 04/04/20 0417  PROCALCITON 10.69    Recent Results (from the past 240 hour(s))  Blood culture (routine single)     Status: Abnormal   Collection Time: 03/31/20  7:00 PM   Specimen: Left Antecubital; Blood  Result Value Ref Range Status   Specimen Description   Final    LEFT ANTECUBITAL Performed at [facility], [address], [place], [place] [address]    Special Requests   Final    BOTTLES DRAWN AEROBIC AND ANAEROBIC Blood Culture adequate volume Performed at [facility] [place], [place] [address]    Culture  Setup  Time   Final    GRAM NEGATIVE RODS ANAEROBIC BOTTLE Gram Stain Report Called to,Read Back By and Verified With: MURPHY,[id number]  BY MATTHEWS, B 9.7.21 [facility] GRAM STAIN REVIEWED-AGREE WITH RESULT    Culture (A)  Final    FUSOBACTERIUM NUCLEATUM BETA LACTAMASE NEGATIVE Performed at [facility], [place] [address], [place], [place] [address]    Report Status 04/04/2020 FINAL  Final  SARS Coronavirus 2 by RT PCR (hospital order, performed in [facility]  hospital lab) Nasopharyngeal Nasopharyngeal Swab     Status: None   Collection Time: 03/31/20  7:00 PM   Specimen: Nasopharyngeal Swab  Result Value Ref Range Status   SARS Coronavirus 2 NEGATIVE NEGATIVE Final    Comment: (NOTE) SARS-CoV-2 target nucleic acids are NOT DETECTED.  The SARS-CoV-2 RNA is generally detectable in upper and lower respiratory specimens during the acute phase of infection. The lowest concentration of SARS-CoV-2 viral copies this assay can detect is 250 copies / mL. A negative result does not preclude SARS-CoV-2 infection and should not be used as the sole basis for treatment or other patient management decisions.  A negative result may occur with improper specimen collection / handling, submission of specimen other than nasopharyngeal swab, presence of viral mutation(s) within the areas targeted by this assay, and inadequate number of viral copies (<250 copies / mL). A negative result must be combined with clinical observations, patient history, and epidemiological information.  Fact Sheet for Patients:   StrictlyIdeas.no  Fact Sheet for Healthcare Providers: BankingDealers.co.za  This test is not yet approved or  cleared by the [place] FDA and has been authorized for detection and/or diagnosis of SARS-CoV-2 by FDA under an Emergency Use Authorization (EUA).  This EUA will remain in effect (meaning this test can be used) for the duration of  the COVID-19 declaration under Section 564(b)(1) of the Act, 21 U.S.C. section 360bbb-3(b)(1), unless the authorization is terminated or revoked sooner.  Performed at [facility], [address], [place], [place] [address]   Blood Culture ID Panel (Reflexed)     Status: None   Collection Time: 03/31/20  7:00 PM  Result Value Ref Range Status   Enterococcus faecalis NOT DETECTED NOT DETECTED Final   Enterococcus Faecium NOT DETECTED NOT DETECTED Final   Listeria monocytogenes NOT DETECTED NOT DETECTED Final   Staphylococcus species NOT DETECTED NOT DETECTED Final   Staphylococcus aureus (BCID) NOT DETECTED NOT DETECTED Final   Staphylococcus epidermidis NOT DETECTED NOT DETECTED Final   Staphylococcus lugdunensis NOT DETECTED NOT DETECTED Final   Streptococcus species NOT DETECTED NOT DETECTED Final   Streptococcus agalactiae NOT DETECTED NOT DETECTED Final   Streptococcus pneumoniae NOT DETECTED NOT DETECTED Final   Streptococcus pyogenes NOT DETECTED NOT DETECTED Final   A.calcoaceticus-baumannii NOT DETECTED NOT DETECTED Final   Bacteroides fragilis NOT DETECTED NOT DETECTED Final   Enterobacterales NOT DETECTED NOT DETECTED Final   Enterobacter cloacae complex NOT DETECTED NOT DETECTED Final   Escherichia coli NOT DETECTED NOT DETECTED Final   Klebsiella aerogenes NOT DETECTED NOT DETECTED Final   Klebsiella oxytoca NOT DETECTED NOT DETECTED Final   Klebsiella pneumoniae NOT DETECTED NOT DETECTED Final   Proteus species NOT DETECTED NOT DETECTED Final   Salmonella species NOT DETECTED NOT DETECTED Final   Serratia marcescens NOT DETECTED NOT DETECTED Final   Haemophilus influenzae NOT DETECTED NOT DETECTED Final   Neisseria meningitidis NOT DETECTED NOT DETECTED Final   Pseudomonas aeruginosa NOT DETECTED NOT DETECTED Final   Stenotrophomonas maltophilia NOT DETECTED NOT DETECTED Final   Candida albicans NOT DETECTED NOT DETECTED Final   Candida auris NOT DETECTED NOT DETECTED  Final   Candida glabrata NOT DETECTED NOT DETECTED Final   Candida krusei NOT DETECTED NOT DETECTED Final   Candida parapsilosis NOT DETECTED NOT DETECTED Final   Candida tropicalis NOT DETECTED NOT DETECTED Final   Cryptococcus neoformans/gattii NOT DETECTED NOT DETECTED Final    Comment: Performed at [facility], [address], [place], [place] [address]  Culture, blood (single)     Status: None   Collection Time: 03/31/20  8:55 PM   Specimen: BLOOD  Result Value Ref Range Status   Specimen Description BLOOD RIGHT ANTECUBITAL  Final   Special Requests   Final    BOTTLES DRAWN AEROBIC AND ANAEROBIC Blood Culture adequate volume   Culture   Final    NO GROWTH 5 DAYS Performed at [facility], [address], [place], [place] [address]    Report Status 04/05/2020 FINAL  Final  Urine culture     Status: Abnormal   Collection Time: 04/01/20 12:44 AM   Specimen: In/Out Cath Urine  Result Value Ref Range Status   Specimen Description   Final    IN/OUT CATH URINE Performed at [facility], [address], [place], [place] [address]    Special Requests   Final    NONE Performed at [facility], [address], [place], [place] [address]    Culture (A)  Final    <10,000 COLONIES/mL INSIGNIFICANT GROWTH Performed at [facility], [place] [address] [place], [place] [address]    Report Status 04/02/2020 FINAL  Final  MRSA PCR Screening     Status: None   Collection Time: 04/01/20  1:54 AM   Specimen: Nasopharyngeal  Result Value Ref Range Status   MRSA by PCR NEGATIVE NEGATIVE Final    Comment:        The GeneXpert MRSA Assay (FDA approved for NASAL specimens only), is one component of a comprehensive MRSA colonization surveillance program. It is not intended to diagnose MRSA infection nor to guide or monitor treatment for MRSA infections. Performed at [facility], [address] [place], [place] [address]   C Difficile Quick Screen w PCR reflex     Status: None    Collection Time: 04/02/20  1:14 PM   Specimen: Stool  Result Value Ref Range Status   C Diff antigen NEGATIVE NEGATIVE Final   C Diff toxin NEGATIVE NEGATIVE Final   C Diff interpretation No C. difficile detected.  Final    Comment: Performed at [facility], [address] [place], [place] [address]  Gastrointestinal Panel by PCR , Stool     Status: None   Collection Time: 04/02/20  1:14 PM   Specimen: Stool  Result Value Ref Range Status   Campylobacter species NOT DETECTED NOT DETECTED Final   Plesimonas shigelloides NOT DETECTED NOT DETECTED Final   Salmonella species NOT DETECTED NOT DETECTED Final   Yersinia enterocolitica NOT DETECTED NOT DETECTED Final   Vibrio species NOT DETECTED NOT DETECTED Final   Vibrio cholerae NOT DETECTED NOT DETECTED Final   Enteroaggregative E coli (EAEC) NOT DETECTED NOT DETECTED Final   Enteropathogenic E coli (EPEC) NOT DETECTED NOT DETECTED Final   Enterotoxigenic E coli (ETEC) NOT DETECTED NOT DETECTED Final   Shiga like toxin producing E coli (STEC) NOT DETECTED NOT DETECTED Final   Shigella/Enteroinvasive E coli (EIEC) NOT DETECTED NOT DETECTED Final   Cryptosporidium NOT DETECTED NOT DETECTED Final   Cyclospora cayetanensis NOT DETECTED NOT DETECTED Final   Entamoeba histolytica NOT DETECTED NOT DETECTED Final   Giardia lamblia NOT DETECTED NOT DETECTED Final   Adenovirus F40/41 NOT DETECTED NOT DETECTED Final   Astrovirus NOT DETECTED NOT DETECTED Final   Norovirus GI/GII NOT DETECTED NOT DETECTED Final   Rotavirus A NOT DETECTED NOT DETECTED Final   Sapovirus (I, II, IV, and V) NOT DETECTED NOT DETECTED  Final    Comment: Performed at [facility], [address] [place], [place] [address]  SARS Coronavirus 2 by RT PCR (hospital order, performed in [facility] hospital lab) Nasopharyngeal Nasopharyngeal Swab     Status: None   Collection Time: 04/10/20 10:36 AM   Specimen: Nasopharyngeal Swab  Result Value Ref Range Status    SARS Coronavirus 2 NEGATIVE NEGATIVE Final    Comment: (NOTE) SARS-CoV-2 target nucleic acids are NOT DETECTED.  The SARS-CoV-2 RNA is generally detectable in upper and lower respiratory specimens during the acute phase of infection. The lowest concentration of SARS-CoV-2 viral copies this assay can detect is 250 copies / mL. A negative result does not preclude SARS-CoV-2 infection and should not be used as the sole basis for treatment or other patient management decisions.  A negative result may occur with improper specimen collection / handling, submission of specimen other than nasopharyngeal swab, presence of viral mutation(s) within the areas targeted by this assay, and inadequate number of viral copies (<250 copies / mL). A negative result must be combined with clinical observations, patient history, and epidemiological information.  Fact Sheet for Patients:   StrictlyIdeas.no  Fact Sheet for Healthcare Providers: BankingDealers.co.za  This test is not yet approved or  cleared by the [place] FDA and has been authorized for detection and/or diagnosis of SARS-CoV-2 by FDA under an Emergency Use Authorization (EUA).  This EUA will remain in effect (meaning this test can be used) for the duration of the COVID-19 declaration under Section 564(b)(1) of the Act, 21 U.S.C. section 360bbb-3(b)(1), unless the authorization is terminated or revoked sooner.  Performed at [facility], [address] [place], [place] [address]     Radiology Studies: No results found.  Scheduled Meds: . sodium chloride   Intravenous Once  . [START ON 04/11/2020] amoxicillin-clavulanate  1 tablet Oral Q12H  . ARIPiprazole  2 mg Oral QHS  . carbidopa-levodopa  1 tablet Oral BID  . Chlorhexidine Gluconate Cloth  6 each Topical Daily  . feeding supplement (ENSURE ENLIVE)  237 mL Oral TID BM  . loratadine  10 mg Oral Daily  . metoprolol tartrate  50 mg  Oral BID  . OXcarbazepine  150 mg Oral BID  . pantoprazole (PROTONIX) IV  40 mg Intravenous Q24H  . saccharomyces boulardii  250 mg Oral BID  . simvastatin  20 mg Oral QHS  . venlafaxine XR  150 mg Oral QHS   Continuous Infusions: . ampicillin-sulbactam (UNASYN) IV 3 g (04/10/20 1206)    LOS: 9 days   Time spent: 30 minutes  Saraih Lorton Wynetta Emery, MD How to contact the [facility] Attending or Consulting provider [place] or covering provider during after hours [place], for this patient?  1. Check the care team in [facility] and look for a) attending/consulting [facility] provider listed and b) the [facility] team listed 2. Log into [email or web address] and use [place]'s universal password to access. If you do not have the password, please contact the hospital operator. 3. Locate the [facility] provider you are looking for under [facility] and page to a number that you can be directly reached. 4. If you still have difficulty reaching the provider, please page the [facility] (Director on Call) for the Hospitalists listed on amion for assistance.   If 7PM-7AM, please contact night-coverage [email or web address] 04/10/2020, 2:16 PM

## 2020-04-10 NOTE — Progress Notes (Signed)
Called report to [place] at [employer].

## 2020-04-11 ENCOUNTER — Encounter: Payer: Self-pay | Admitting: Internal Medicine

## 2020-04-11 ENCOUNTER — Encounter (HOSPITAL_COMMUNITY): Payer: Self-pay | Admitting: Internal Medicine

## 2020-04-11 LAB — SURGICAL PATHOLOGY

## 2020-04-11 NOTE — Progress Notes (Signed)
Nsg Discharge Note  Admit Date:  03/31/2020 Discharge date: 04/11/2020   [name] Hegg to be D/C'd Rehab per MD order.  AVS completed.  Copy for chart, and copy for patient signed, and dated. Report was called to [facility] last night. [facility] came to pick up patient at 0747 this morning to transport patient to [facility]. Patient/caregiver able to verbalize understanding.  Discharge Medication: Allergies as of 04/11/2020      Reactions   Terazosin Other (See Comments)   Syncope   Terazosin Other (See Comments)   unknown      Medication List    STOP taking these medications   azelastine 0.1 % nasal spray Commonly known as: ASTELIN   docusate sodium 100 MG capsule Commonly known as: COLACE   enoxaparin 30 MG/0.3ML injection Commonly known as: LOVENOX   feeding supplement (ENSURE ENLIVE) Liqd   fexofenadine 180 MG tablet Commonly known as: ALLEGRA   fluticasone 50 MCG/ACT nasal spray Commonly known as: FLONASE   guaiFENesin 100 MG/5ML Soln Commonly known as: ROBITUSSIN   levETIRAcetam 500 MG tablet Commonly known as: KEPPRA   loratadine 10 MG tablet Commonly known as: CLARITIN   Oxycodone HCl 10 MG Tabs   sodium chloride 0.65 % Soln nasal spray Commonly known as: OCEAN   tamsulosin 0.4 MG Caps capsule Commonly known as: FLOMAX     TAKE these medications   acetaminophen 325 MG tablet Commonly known as: TYLENOL Take 2 tablets (650 mg total) by mouth every 6 (six) hours as needed. What changed:   when to take this  reasons to take this   albuterol 108 (90 Base) MCG/ACT inhaler Commonly known as: VENTOLIN HFA Inhale 2 puffs into the lungs every 6 (six) hours as needed for wheezing or shortness of breath.   amoxicillin-clavulanate 875-125 MG tablet Commonly known as: AUGMENTIN Take 1 tablet by mouth every 12 (twelve) hours for 4 days.   apixaban 5 MG Tabs tablet Commonly known as: Eliquis Take 1 tablet (5 mg total) by mouth in the morning and at  bedtime. Start taking on: April 14, 2020 What changed:   how much to take  These instructions start on April 14, 2020. If you are unsure what to do until then, ask your doctor or other care provider.   ARIPiprazole 2 MG tablet Commonly known as: ABILIFY Take 2 mg by mouth at bedtime.   carbidopa-levodopa 25-100 MG tablet Commonly known as: SINEMET IR Take 1 tablet by mouth in the morning and at bedtime.   cetirizine 10 MG tablet Commonly known as: ZYRTEC Take 10 mg by mouth daily.   famotidine 20 MG tablet Commonly known as: Pepcid Take 1 tablet (20 mg total) by mouth 2 (two) times daily.   gabapentin 300 MG capsule Commonly known as: NEURONTIN Take 1 capsule (300 mg total) by mouth 3 (three) times daily. What changed: when to take this   lisinopril 40 MG tablet Commonly known as: ZESTRIL Take 0.5 tablets (20 mg total) by mouth every morning.   methocarbamol 500 MG tablet Commonly known as: ROBAXIN Take 1 tablet (500 mg total) by mouth every 8 (eight) hours as needed for muscle spasms. What changed:   when to take this  reasons to take this   metoprolol tartrate 50 MG tablet Commonly known as: LOPRESSOR Take 50 mg by mouth 2 (two) times daily.   multivitamin with minerals Tabs tablet Take 1 tablet by mouth at bedtime.   Oxcarbazepine 300 MG tablet Commonly known  as: TRILEPTAL Take 1 tablet (300 mg total) by mouth 2 (two) times daily. What changed: how much to take   polyethylene glycol 17 g packet Commonly known as: MIRALAX / GLYCOLAX Take 17 g by mouth daily as needed for mild constipation. What changed:   when to take this  reasons to take this   saccharomyces boulardii 250 MG capsule Commonly known as: FLORASTOR Take 1 capsule (250 mg total) by mouth 2 (two) times daily.   simvastatin 40 MG tablet Commonly known as: ZOCOR Take 20 mg by mouth at bedtime.   venlafaxine XR 150 MG 24 hr capsule Commonly known as: EFFEXOR-XR Take 150 mg  by mouth daily with breakfast.       Discharge Assessment: Vitals:   04/10/20 2100 04/11/20 0500  BP: (!) 165/97 (!) 171/84  Pulse: 90 86  Resp: 20 20  Temp: 98.2 F (36.8 C) 98 F (36.7 C)  SpO2: 96% 94%   Skin clean, dry and intact without evidence of skin break down, no evidence of skin tears noted. IV catheter discontinued intact. Site without signs and symptoms of complications - no redness or edema noted at insertion site, patient denies c/o pain - only slight tenderness at site.  Dressing with slight pressure applied.  D/c Instructions-Education: Discharge instructions given to patient/family with verbalized understanding. D/c education completed with patient/family including follow up instructions, medication list, d/c activities limitations if indicated, with other d/c instructions as indicated by MD - patient able to verbalize understanding, all questions fully answered. Patient instructed to return to ED, call 911, or call MD for any changes in condition.  Patient escorted via [employer], and D/C home via private auto.  Santa Lighter, RN 04/11/2020 8:16 AM

## 2020-05-28 ENCOUNTER — Ambulatory Visit: Payer: Non-veteran care | Admitting: Gastroenterology

## 2020-05-28 ENCOUNTER — Encounter: Payer: Self-pay | Admitting: Gastroenterology

## 2022-02-26 IMAGING — CT CT MAXILLOFACIAL W/O CM
3 series · 15 of 47 positions shown, 18 images · non-contrast
Comparison: Head CT from yesterday

CLINICAL DATA: Facial trauma follow-up

EXAM:
CT HEAD WITHOUT CONTRAST
CT MAXILLOFACIAL WITHOUT CONTRAST
TECHNIQUE: Multidetector CT imaging of the head and maxillofacial structures
were performed using the standard protocol without intravenous
contrast. Multiplanar CT image reconstructions of the maxillofacial
structures were also generated.

[Series 3: facialbone 2.0 st · axial · 0.33mm/px · z∈[-192,-16]mm · 9 of 104 slices shown, 12 images]
[im 8/104  brain]
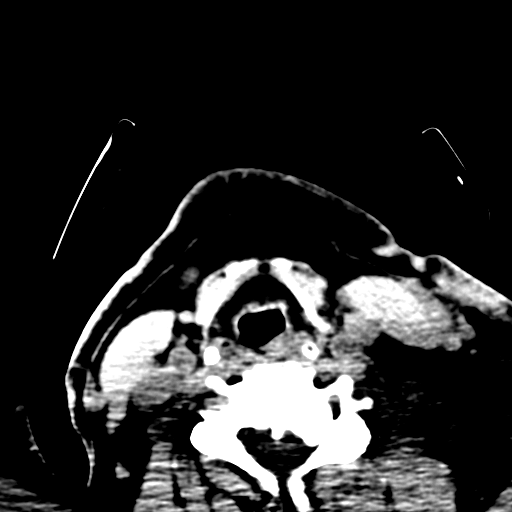
[im 8/104  bone]
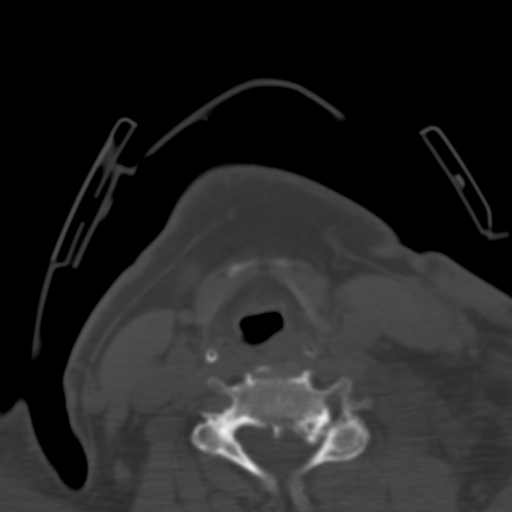
[im 18/104  bone]
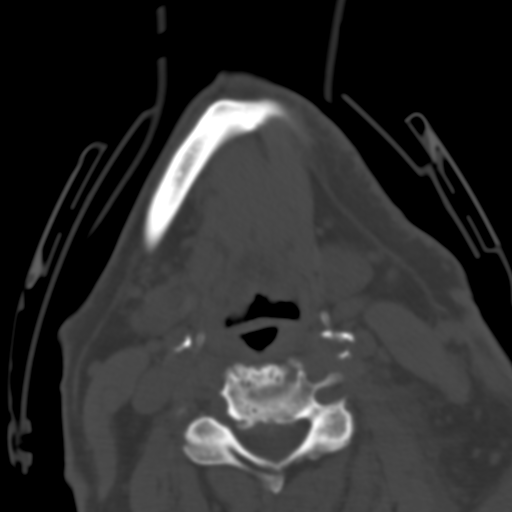
[im 29/104  bone]
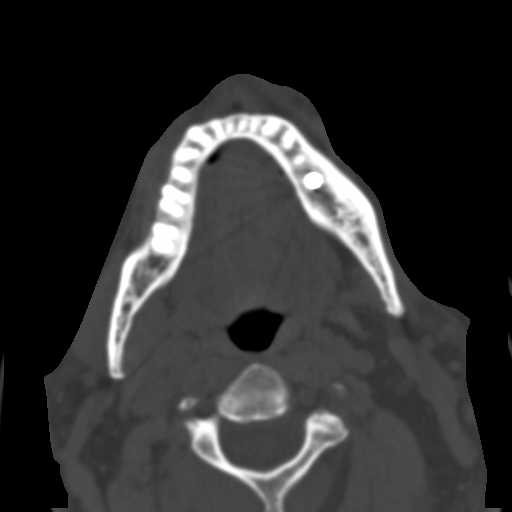
[im 40/104  bone]
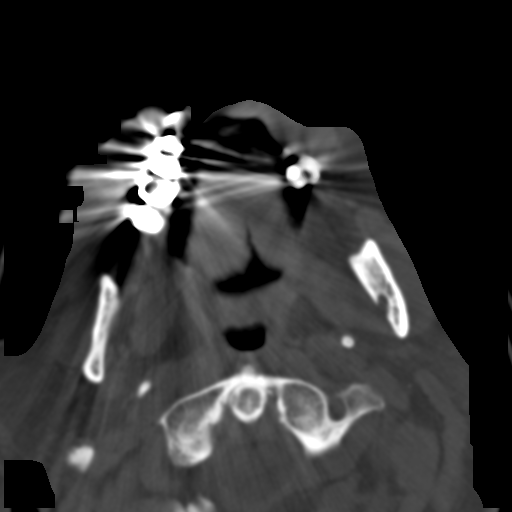
[im 54/104  brain]
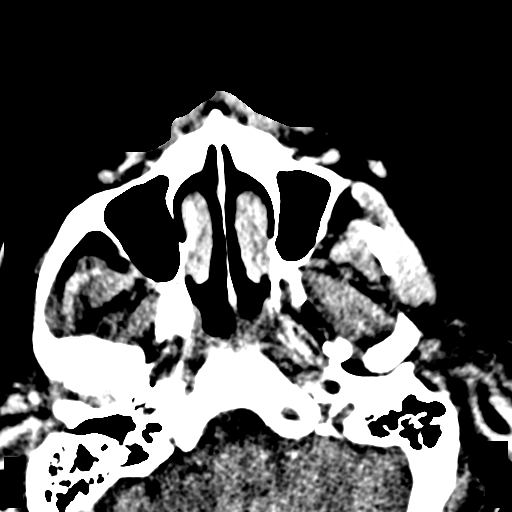
[im 54/104  bone]
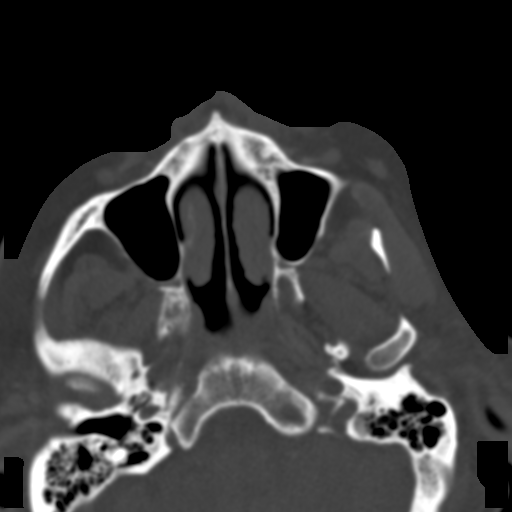
[im 64/104  bone]
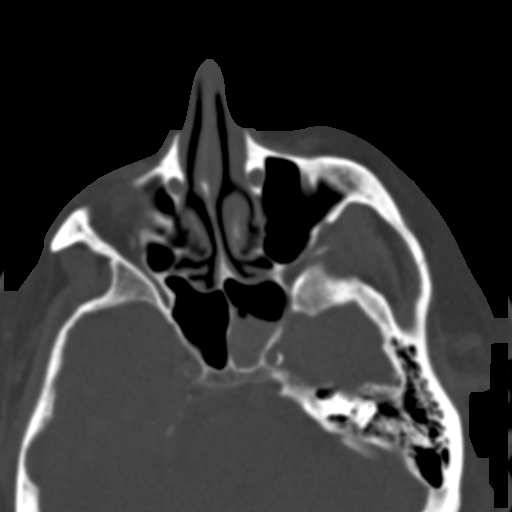
[im 75/104  bone]
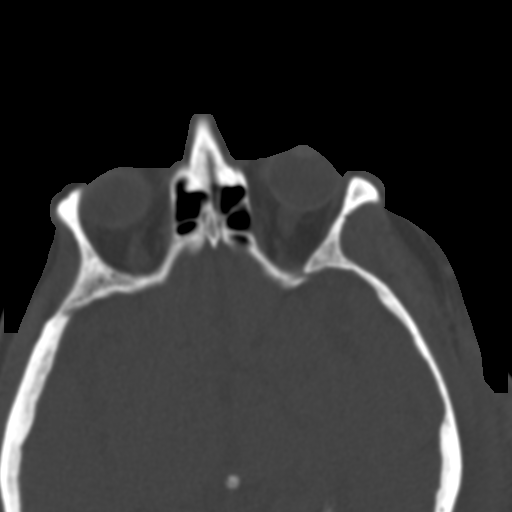
[im 86/104  bone]
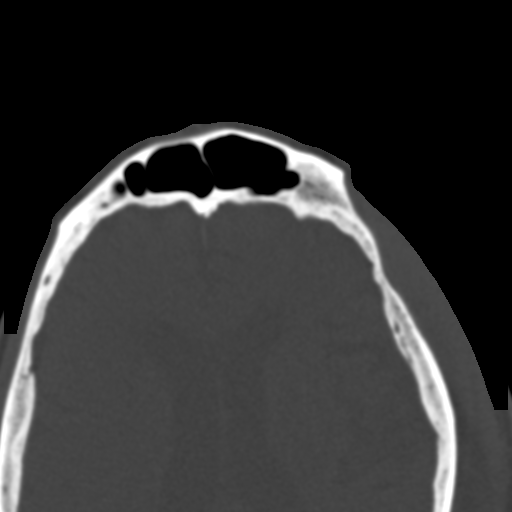
[im 96/104  brain]
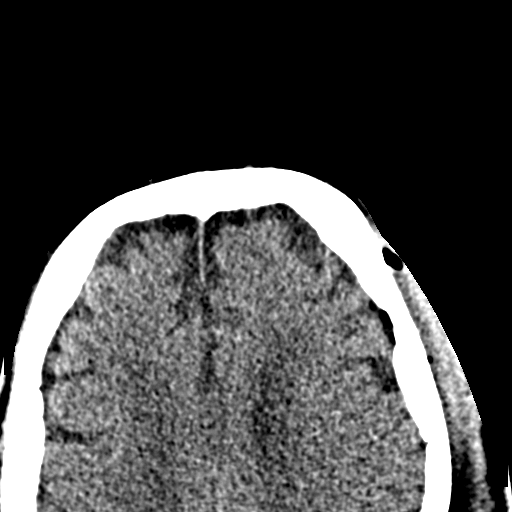
[im 96/104  bone]
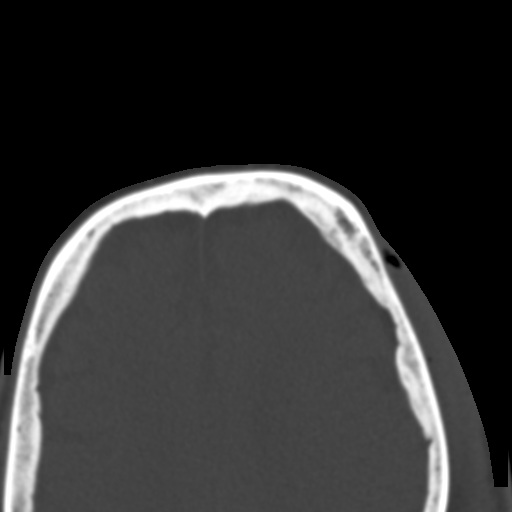

[Series 7: facialbone 2.0 cor st · coronal · 0.45mm/px · 3 of 113 slices shown]
[im 38/113  bone]
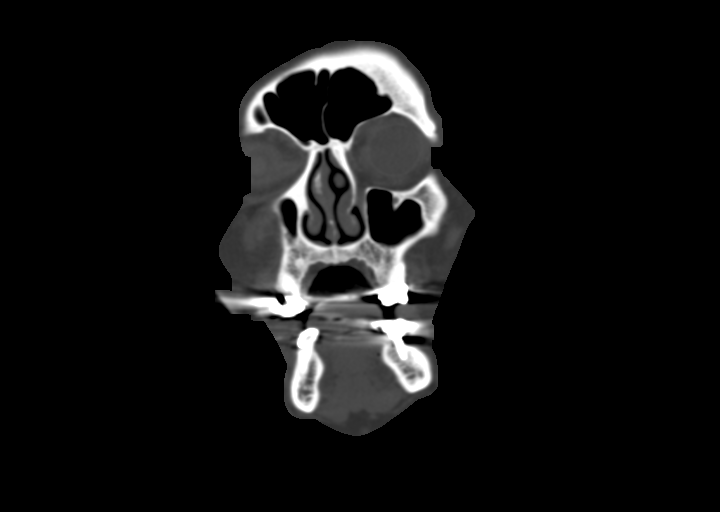
[im 50/113  bone]
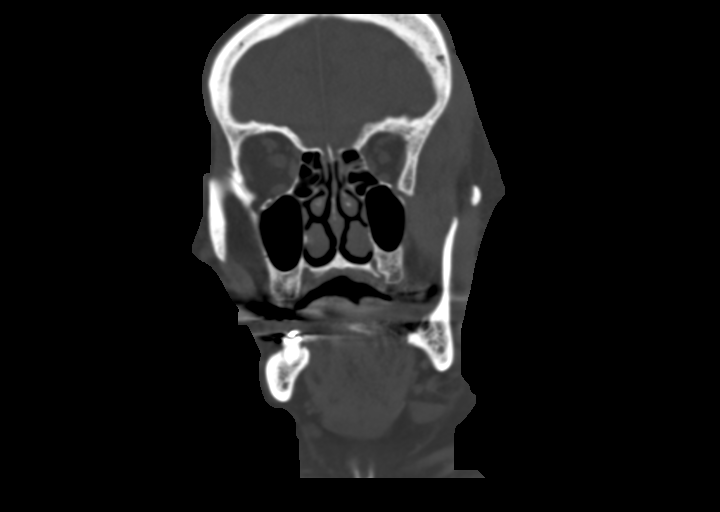
[im 63/113  bone]
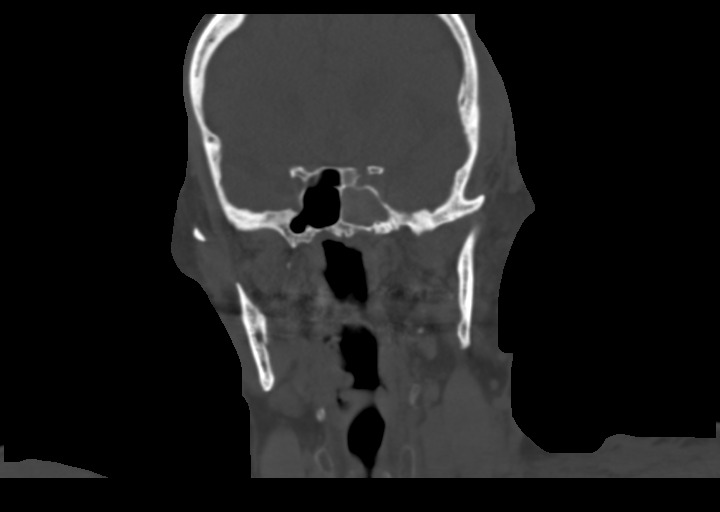

[Series 8: facialbone 2.0 sag st · sagittal · 0.41mm/px · 3 of 92 slices shown]
[im 31/92  bone]
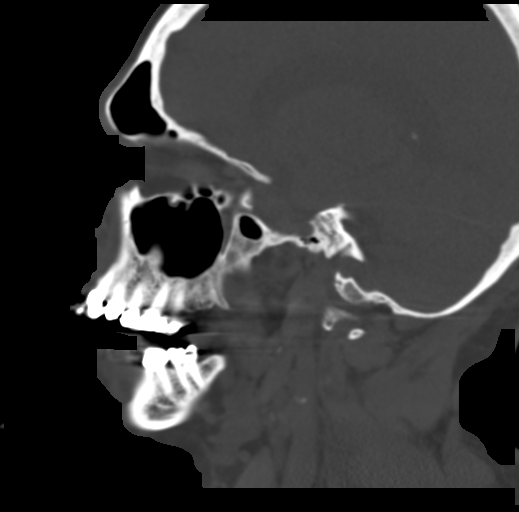
[im 46/92  bone]
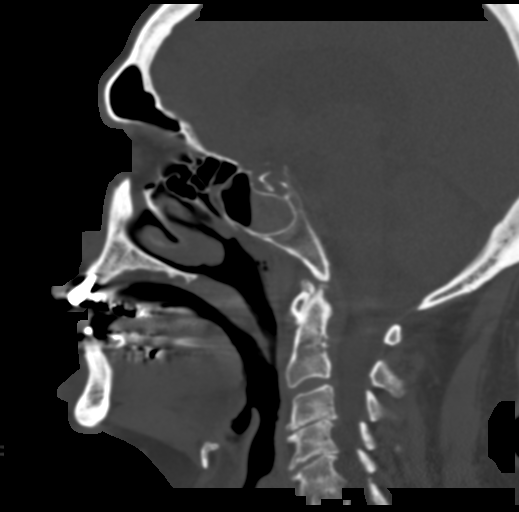
[im 61/92  bone]
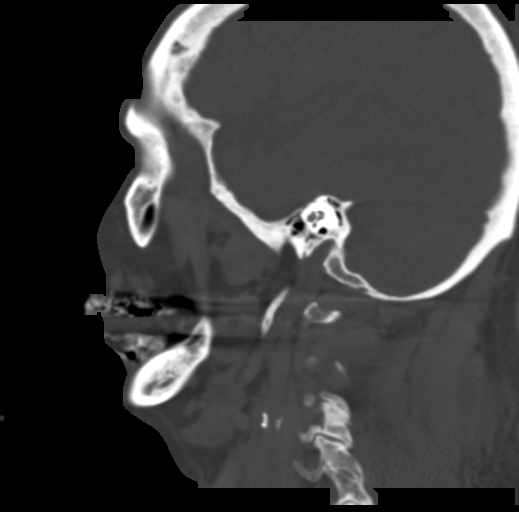

[15 of 47 positions shown; findings below may reference images not displayed]

FINDINGS: CT HEAD FINDINGS

Brain: Right inferior temporal hemorrhagic contusion with no
progression. The largest pocket of blood is 5 mm. There could be
overlying subarachnoid blood which is also not progressed.

Minimal subdural hemorrhage seen along the right tentorium. No
left-sided hemorrhage seen today. No infarct or hydrocephalus.
Chronic small vessel ischemia in the deep cerebral white matter

Vascular: No hyperdense vessel or unexpected calcification.

Skull: Limited, nondisplaced fracture through the left calvarium.
Left scalp hematoma with progressive swelling.

CT MAXILLOFACIAL FINDINGS

Osseous: Nondepressed left zygomatic arch fracture. There is mild
deformity of the posterior left maxillary sinus wall without
definite acute fracture at this level. Certainly no tripod complex
fracture. Mandible is intact and located

Orbits: No evidence of injury

Sinuses: High-density in the left sphenoid sinus could reflect
hemosinus (or incidental inflammatory inspissated material) but no
visible underlying fracture.

Soft tissues: Left scalp swelling and gas
IMPRESSION: 1. Unchanged right inferior temporal hemorrhagic contusion.
2. Trace subdural hemorrhage is now seen along the right tentorium.
3. Left calvarial and zygomatic arch fractures, nondisplaced.

## 2022-07-28 ENCOUNTER — Encounter: Payer: No Typology Code available for payment source | Attending: Internal Medicine | Admitting: Internal Medicine

## 2022-07-28 DIAGNOSIS — L98412 Non-pressure chronic ulcer of buttock with fat layer exposed: Secondary | ICD-10-CM | POA: Insufficient documentation

## 2022-07-28 DIAGNOSIS — Z7901 Long term (current) use of anticoagulants: Secondary | ICD-10-CM | POA: Diagnosis not present

## 2022-07-28 DIAGNOSIS — Z86718 Personal history of other venous thrombosis and embolism: Secondary | ICD-10-CM | POA: Insufficient documentation

## 2022-07-28 DIAGNOSIS — I10 Essential (primary) hypertension: Secondary | ICD-10-CM | POA: Diagnosis not present

## 2022-07-28 DIAGNOSIS — F4310 Post-traumatic stress disorder, unspecified: Secondary | ICD-10-CM | POA: Diagnosis not present

## 2022-07-28 NOTE — Progress Notes (Signed)
[name], [name] ([id number]) [phone] [email or web address] Page 1 of 5 Visit Report for 07/28/2022 Abuse Risk Screen Details Patient Name: Date of Service: Nathan [name], Nathan [name] 07/28/2022 8:45 A M Medical Record Number: [id number] Patient Account Number: [id number] Date of Birth/Sex: Treating RN: 02/27/1946 (77 y.o. Male) [name] Primary Care Jaking Thayer: MIRA NDA, HA RV EY Other Clinician: Referring Tais Koestner: Treating Rodel Glaspy/Extender: Kalman Shan PA V LIS, BERNA DETTE Weeks in Treatment: 0 Abuse Risk Screen Items Answer ABUSE RISK SCREEN: Has anyone close to you tried to hurt or harm you recentlyo No Do you feel uncomfortable with anyone in your familyo No Has anyone forced you do things that you didnt want to doo No Electronic Signature(s) Signed: 07/28/2022 9:24:28 AM By: [name] RN Entered By: [name] on 07/28/2022 09:10:42 -------------------------------------------------------------------------------- Activities of Daily Living Details Patient Name: Date of Service: Nathan [name], Nathan [name] 07/28/2022 8:45 A M Medical Record Number: [id number] Patient Account Number: [id number] Date of Birth/Sex: Treating RN: 10-Jul-1946 (76 y.o. Male) [name] Primary Care [name]: MIRA NDA, HA RV EY Other Clinician: Referring [name]: Treating [name]/Extender: Kalman Shan PA V LIS, BERNA DETTE Weeks in Treatment: 0 Activities of Daily Living Items Answer Activities of Daily Living (Please select one for each item) Drive Automobile Not Able T Medications ake Completely Able Use T elephone Completely Able Care for Appearance Need Assistance Use T oilet Need Assistance Bath / Shower Need Assistance Dress Self Need Assistance Feed Self Completely Able Walk Need Assistance Get In / Out Bed Need Assistance Housework Not Nathan [name], Nathan [name] ([id number]) [phone] [id number] Page 2 of 5 Prepare Meals Not Able Handle Money Completely  Able Shop for Self Not Able Electronic Signature(s) Signed: 07/28/2022 9:24:28 AM By: [name] RN Entered By: [name] on 07/28/2022 09:11:44 -------------------------------------------------------------------------------- Education Screening Details Patient Name: Date of Service: Nathan [name], Nathan [name]. 07/28/2022 8:45 A M Medical Record Number: [id number] Patient Account Number: [id number] Date of Birth/Sex: Treating RN: [birth year] (77 y.o. Male) [name] Primary Care Chandlar Guice: MIRA NDA, HA RV EY Other Clinician: Referring Jeriel Vivanco: Treating [name]/Extender: Kalman Shan PA V LIS, BERNA DETTE Weeks in Treatment: 0 Primary Learner Assessed: Patient Learning Preferences/Education Level/Primary Language Learning Preference: Explanation Highest Education Level: College or Above Preferred Language: English Cognitive Barrier Language Barrier: No Translator Needed: No Memory Deficit: No Emotional Barrier: No Cultural/Religious Beliefs Affecting Medical Care: No Physical Barrier Impaired Vision: Yes Glasses Impaired Hearing: No Decreased Hand dexterity: No Knowledge/Comprehension Knowledge Level: Medium Comprehension Level: High Ability to understand written instructions: High Ability to understand verbal instructions: High Motivation Anxiety Level: Anxious Cooperation: Cooperative Education Importance: Acknowledges Need Interest in Health Problems: Asks Questions Perception: Coherent Willingness to Engage in Self-Management High Activities: Readiness to Engage in Self-Management High Activities: Electronic Signature(s) Signed: 07/28/2022 9:24:28 AM By: [name] RN Entered By: [name] on 07/28/2022 09:12:19 [name], [name] ([id number]) [id number] Page 3 of 5 -------------------------------------------------------------------------------- Fall Risk Assessment Details Patient Name: Date of Service: Nathan [name], [name] 07/28/2022  8:45 A M Medical Record Number: [id number] Patient Account Number: [id number] Date of Birth/Sex: Treating RN: 1946/07/10 (77 y.o. Male) [name] Primary Care [name]: MIRA NDA, HA RV EY Other Clinician: Referring [name]: Treating [name]/Extender: Kalman Shan PA V LIS, BERNA DETTE Weeks in Treatment: 0 Fall Risk Assessment Items Have you had 2 or more falls in the last 12 monthso 0 Yes Have you had any fall that resulted in injury in the last 12 monthso 0 Yes FALLS RISK SCREEN History of  falling - immediate or within 3 months 25 Yes Secondary diagnosis (Do you have 2 or more medical diagnoseso) 0 No Ambulatory aid None/bed rest/wheelchair/nurse 0 No Crutches/cane/walker 15 Yes Furniture 0 No Intravenous therapy Access/Saline/Heparin Lock 0 No Gait/Transferring Normal/ bed rest/ wheelchair 0 No Weak (short steps with or without shuffle, stooped but able to lift head while walking, may seek 10 Yes support from furniture) Impaired (short steps with shuffle, may have difficulty arising from chair, head down, impaired 0 No balance) Mental Status Oriented to own ability 0 No Electronic Signature(s) Signed: 07/28/2022 9:24:28 AM By: [name] RN Entered By: [name] on 07/28/2022 09:12:52 -------------------------------------------------------------------------------- Foot Assessment Details Patient Name: Date of Service: Nathan [name], Nathan [name]. 07/28/2022 8:45 A M Medical Record Number: [id number] Patient Account Number: [id number] Date of Birth/Sex: Treating RN: 07-01-1946 (77 y.o. Male) [name] Primary Care [name]: MIRA NDA, HA RV EY Other Clinician: Referring Hindy Perrault: Treating Viha Kriegel/Extender: Kalman Shan PA V LIS, BERNA DETTE Weeks in Treatment: 0 Foot Assessment Items Site Locations Nathan [name], Nathan [name] ([id number]) [phone] [id number] Page 4 of 5 + = Sensation present, - = Sensation absent, C = Callus, U = Ulcer R = Redness, W =  Warmth, M = Maceration, PU = Pre-ulcerative lesion F = Fissure, S = Swelling, D = Dryness Assessment Right: Left: Other Deformity: No No Prior Foot Ulcer: No No Prior Amputation: No No Charcot Joint: No No Ambulatory Status: Ambulatory With Help Assistance Device: Walker Gait: Administrator, arts) Signed: 07/28/2022 9:24:28 AM By: [name] RN Entered By: [name] on 07/28/2022 09:13:37 -------------------------------------------------------------------------------- Nutrition Risk Screening Details Patient Name: Date of Service: Nathan [name], Nathan [name] 07/28/2022 8:45 A M Medical Record Number: [id number] Patient Account Number: [id number] Date of Birth/Sex: Treating RN: [birth year] (77 y.o. Male) [name] Primary Care Greene Diodato: MIRA NDA, HA RV EY Other Clinician: Referring Pragya Lofaso: Treating Armin Yerger/Extender: Kalman Shan PA V LIS, BERNA DETTE Weeks in Treatment: 0 Height (in): 71 Weight (lbs): 183 Body Mass Index (BMI): 25.5 Nutrition Risk Screening Items Score Screening NUTRITION RISK SCREEN: I have an illness or condition that made me change the kind and/or amount of food I eat 0 No I eat fewer than two meals per day 0 No I eat few fruits and vegetables, or milk products 0 No I have three or more drinks of beer, liquor or wine almost every day 0 No I have tooth or mouth problems that make it hard for me to eat 0 No RHYLEE, NUNN [name] ([id number]) [id number] Page 5 of 5 I don't always have enough money to buy the food I need 0 No I eat alone most of the time 0 No I take three or more different prescribed or over-the-counter drugs a day 1 Yes Without wanting to, I have lost or gained 10 pounds in the last six months 0 No I am not always physically able to shop, cook and/or feed myself 2 Yes Nutrition Protocols Good Risk Protocol Moderate Risk Protocol 0 Provide education on nutrition High Risk Proctocol Risk Level:  Moderate Risk Score: 3 Electronic Signature(s) Signed: 07/28/2022 9:24:28 AM By: [name] RN Entered By: [name] on 07/28/2022 09:13:15

## 2022-07-28 NOTE — Progress Notes (Addendum)
JAYLN, HULTON ([id number]) [id number] Page 1 of 6 Visit Report for 07/28/2022 Allergy List Details Patient Name: Date of Service: Nathan PECRogue, Nathan [name] 07/28/2022 8:45 A M Medical Record Number: [id number] Patient Account Number: [id number] Date of Birth/Sex: Treating RN: 27-Mar-1946 (76 y.o. Jerilynn Mages) [name] Primary Care Penelopi Mikrut: MIRA NDA, HA RV EY Other Clinician: Referring Jamara Vary: Treating Ova Gillentine/Extender: Kalman Shan PA V LIS, BERNA DETTE Weeks in Treatment: 0 Allergies Active Allergies terazosin Allergy Notes Electronic Signature(s) Signed: 07/28/2022 9:24:28 AM By: [name] RN Entered By: [name] on 07/28/2022 09:08:05 -------------------------------------------------------------------------------- Arrival Information Details Patient Name: Date of Service: Nathan [name], Nathan J. 07/28/2022 8:45 A M Medical Record Number: [id number] Patient Account Number: [id number] Date of Birth/Sex: Treating RN: 10-04-1945 (76 y.o. Jerilynn Mages) [name] Primary Care Kalep Full: MIRA NDA, HA RV EY Other Clinician: Referring Gennett Garcia: Treating Verlin Uher/Extender: Kalman Shan PA V LIS, BERNA DETTE Weeks in Treatment: 0 Visit Information Patient Arrived: Wheel Chair Arrival Time: 09:02 Accompanied By: wife Transfer Assistance: None Patient Identification Verified: Yes Secondary Verification Process Completed: Yes Patient Requires Transmission-Based Precautions: No Patient Has Alerts: Yes Patient Alerts: Patient on Blood Thinner Electronic Signature(s) Signed: 07/29/2022 5:05:00 PM By: [name] RN Previous Signature: 07/28/2022 9:24:28 AM Version By: [name] RN Entered By: [name] on 07/28/2022 09:36:55 -------------------------------------------------------------------------------- Clinic Level of Care Assessment Details Patient Name: Date of Service: Nathan [name], Nathan [name] 07/28/2022 8:45 A M Medical Record Number: [id number] Patient Account Number:  [id number] Date of Birth/Sex: Treating RN: 09-01-1945 (76 y.o. Jerilynn Mages) [name] Primary Care Nahome Bublitz: MIRA NDA, HA RV EY Other Clinician: Referring Rally Ouch: Treating Roey Coopman/Extender: Kalman Shan PA V LIS, BERNA DETTE Weeks in Treatment: 0 Clinic Level of Care Assessment Items TOOL 2 Quantity Score X- 1 0 Use when only an EandM is performed on the INITIAL visit ASSESSMENTS - Nursing Assessment / Reassessment X- 1 20 General Physical Exam (combine w/ comprehensive assessment (listed just below) when performed on new pt. evalsRAIN, CURL ([id number]) [id number] Page 2 of 6 X- 1 25 Comprehensive Assessment (HX, ROS, Risk Assessments, Wounds Hx, etc.) ASSESSMENTS - Wound and Skin A ssessment / Reassessment X - Simple Wound Assessment / Reassessment - one wound 1 5 []  - 0 Complex Wound Assessment / Reassessment - multiple wounds []  - 0 Dermatologic / Skin Assessment (not related to wound area) ASSESSMENTS - Ostomy and/or Continence Assessment and Care []  - 0 Incontinence Assessment and Management []  - 0 Ostomy Care Assessment and Management (repouching, etc.) PROCESS - Coordination of Care X - Simple Patient / Family Education for ongoing care 1 15 []  - 0 Complex (extensive) Patient / Family Education for ongoing care []  - 0 Staff obtains Programmer, systems, Records, T Results / Process Orders est []  - 0 Staff telephones HHA, Nursing Homes / Clarify orders / etc []  - 0 Routine Transfer to another Facility (non-emergent condition) []  - 0 Routine Hospital Admission (non-emergent condition) []  - 0 New Admissions / Biomedical engineer / Ordering NPWT Apligraf, etc. , []  - 0 Emergency Hospital Admission (emergent condition) X- 1 10 Simple Discharge Coordination []  - 0 Complex (extensive) Discharge Coordination PROCESS - Special Needs []  - 0 Pediatric / Minor Patient Management []  - 0 Isolation Patient Management []  - 0 Hearing / Language  / Visual special needs []  - 0 Assessment of Community assistance (transportation, D/C planning, etc.) []  - 0 Additional assistance / Altered mentation []  - 0 Support Surface(s) Assessment (bed, cushion, seat, etc.) INTERVENTIONS - Wound Cleansing / Measurement X-  1 5 Wound Imaging (photographs - any number of wounds) []  - 0 Wound Tracing (instead of photographs) X- 1 5 Simple Wound Measurement - one wound []  - 0 Complex Wound Measurement - multiple wounds X- 1 5 Simple Wound Cleansing - one wound []  - 0 Complex Wound Cleansing - multiple wounds INTERVENTIONS - Wound Dressings []  - 0 Small Wound Dressing one or multiple wounds X- 1 15 Medium Wound Dressing one or multiple wounds []  - 0 Large Wound Dressing one or multiple wounds []  - 0 Application of Medications - injection INTERVENTIONS - Miscellaneous []  - 0 External ear exam []  - 0 Specimen Collection (cultures, biopsies, blood, body fluids, etc.) []  - 0 Specimen(s) / Culture(s) sent or taken to Lab for analysis []  - 0 Patient Transfer (multiple staff / Harrel Lemon Lift / Similar devices) []  - 0 Simple Staple / Suture removal (25 or less) []  - 0 Complex Staple / Suture removal (26 or more) Bart, Ghali J ([id number]) [id number] Page 3 of 6 []  - 0 Hypo / Hyperglycemic Management (close monitor of Blood Glucose) []  - 0 Ankle / Brachial Index (ABI) - do not check if billed separately Has the patient been seen at the hospital within the last three years: Yes Total Score: 105 Level Of Care: New/Established - Level 3 Electronic Signature(s) Signed: 07/29/2022 5:05:00 PM By: [name] RN Entered By: [name] on 07/28/2022 09:43:10 -------------------------------------------------------------------------------- Lower Extremity Assessment Details Patient Name: Date of Service: Nathan [name], Nathan [name] 07/28/2022 8:45 A M Medical Record Number: [id number] Patient Account Number: [id number] Date of  Birth/Sex: Treating RN: 28-Jun-1946 (76 y.o. Jerilynn Mages) [name] Primary Care Kaitlyne Friedhoff: Rubin Payor NDA, HA RV EY Other Clinician: Referring Yaniyah Koors: Treating Brixton Schnapp/Extender: Kalman Shan PA V LIS, BERNA DETTE Weeks in Treatment: 0 Electronic Signature(s) Signed: 07/28/2022 9:24:28 AM By: [name] RN Entered By: [name] on 07/28/2022 09:07:50 -------------------------------------------------------------------------------- Multi Wound Chart Details Patient Name: Date of Service: Nathan [name], Nathan J. 07/28/2022 8:45 A M Medical Record Number: [id number] Patient Account Number: [id number] Date of Birth/Sex: Treating RN: 1946/06/16 (76 y.o. Jerilynn Mages) [name] Primary Care Priest Lockridge: MIRA NDA, HA RV EY Other Clinician: Referring Addie Cederberg: Treating Meghen Akopyan/Extender: Kalman Shan PA V LIS, BERNA DETTE Weeks in Treatment: 0 Vital Signs Height(in): 71 Pulse(bpm): 68 Weight(lbs): 183 Blood Pressure(mmHg): 129/72 Body Mass Index(BMI): 25.5 Temperature(F): 98.2 Respiratory Rate(breaths/min): 18 [07/26/2021:Photos:] [N/A:N/A] Gluteal fold N/A N/A Wound Location: Trauma N/A N/A Wounding Event: Trauma, Other N/A N/A Primary Etiology: Hypertension, Seizure Disorder N/A N/A Comorbid History: 07/26/2021 N/A N/A Date Acquired: 0 N/A N/A Weeks of Treatment: Open N/A N/A Wound Status: No N/A N/A Wound Recurrence: 20x9x0.2 N/A N/A Measurements L x W x D (cm) 141.372 N/A N/A A (cm) : rea 28.274 N/A N/A Volume (cm) : Full Thickness Without Exposed N/A N/A Classification: Support Structures Medium N/A N/A Exudate Amount: Serosanguineous N/A N/A Exudate Type: red, brown N/A N/A Exudate Color: Small (1-33%) N/A N/A Granulation Amount: LEELYND, ROMUALDO ([id number]) [id number] Page 4 of 6 Pink N/A N/A Granulation Quality: Large (67-100%) N/A N/A Necrotic Amount: Eschar, Adherent Slough N/A N/A Necrotic Tissue: Fat Layer (Subcutaneous Tissue): Yes N/A  N/A Exposed Structures: Fascia: No Tendon: No Muscle: No Joint: No Bone: No None N/A N/A Epithelialization: Treatment Notes Electronic Signature(s) Signed: 07/28/2022 1:46:18 PM By: Kalman Shan DO Previous Signature: 07/28/2022 9:24:28 AM Version By: [name] RN Entered By: Kalman Shan on 07/28/2022 09:55:23 -------------------------------------------------------------------------------- Multi-Disciplinary Care Plan Details Patient Name: Date of Service: Nathan [name], Nathan J. 07/28/2022 8:45  A M Medical Record Number: [id number] Patient Account Number: [id number] Date of Birth/Sex: Treating RN: [birth year] (76 y.o. Jerilynn Mages) [name] Primary Care Bronsyn Shappell: MIRA NDA, HA RV EY Other Clinician: Referring Anthonella Klausner: Treating Arbie Blankley/Extender: Kalman Shan PA V LIS, BERNA DETTE Weeks in Treatment: 0 Active Inactive Electronic Signature(s) Signed: 09/02/2022 9:23:58 AM By: Gretta Cool, BSN, RN, CWS, Kim RN, BSN Signed: 10/19/2022 8:44:08 AM By: [name] RN Previous Signature: 07/29/2022 5:05:00 PM Version By: [name] RN Entered By: Gretta Cool, BSN, RN, CWS, Kim on 09/02/2022 09:23:58 -------------------------------------------------------------------------------- Pain Assessment Details Patient Name: Date of Service: Nathan [name], Nathan J. 07/28/2022 8:45 A M Medical Record Number: [id number] Patient Account Number: [id number] Date of Birth/Sex: Treating RN: 30-Sep-1945 (76 y.o. Jerilynn Mages) [name] Primary Care Aniylah Avans: MIRA NDA, HA RV EY Other Clinician: Referring T[place]qua Kretz: Treating Prakash Kimberling/Extender: Kalman Shan PA V LIS, BERNA DETTE Weeks in Treatment: 0 Active Problems Location of Pain Severity and Description of Pain Patient Has Paino Yes Site Locations With Dressing Change: Yes Duration of the Pain. Constant / Intermittento Intermittent How Long Does it Lasto Hours: 1 Minutes: Rate the pain. Current Pain Level: 7 Worst Pain Level: 10 Least Pain Level: 3 Tolerable Pain  Level: 0 Loiselle, Samir J ([id number]) [id number] Page 5 of 6 Character of Pain Describe the Pain: Throbbing Pain Management and Medication Current Pain Management: Medication: Yes Cold Application: No Rest: Yes Massage: No Activity: No T.E.N.S.: No Heat Application: No Leg drop or elevation: No Is the Current Pain Management Adequate: Inadequate How does your wound impact your activities of daily livingo Sleep: No Bathing: No Appetite: No Relationship With Others: No Bladder Continence: No Emotions: No Bowel Continence: No Work: No Toileting: No Drive: No Dressing: No Hobbies: No Electronic Signature(s) Signed: 07/28/2022 9:24:28 AM By: [name] RN Entered By: [name] on 07/28/2022 09:07:04 -------------------------------------------------------------------------------- Patient/Caregiver Education Details Patient Name: Date of Service: Nathan [name], Nathan [name] 1/3/2024andnbsp8:45 A M Medical Record Number: [id number] Patient Account Number: [id number] Date of Birth/Gender: Treating RN: 09/24/1945 (76 y.o. Jerilynn Mages) [name] Primary Care Physician: MIRA NDA, HA RV EY Other Clinician: Referring Physician: Treating Physician/Extender: Kalman Shan PA V LIS, BERNA DETTE Weeks in Treatment: 0 Education Assessment Education Provided To: Patient Education Topics Provided Wound/Skin Impairment: Methods: Explain/Verbal Responses: State content correctly Electronic Signature(s) Signed: 07/29/2022 5:05:00 PM By: [name] RN Entered By: [name] on 07/28/2022 09:43:26 -------------------------------------------------------------------------------- Wound Assessment Details Patient Name: Date of Service: Nathan [name], Nathan [name] 07/28/2022 8:45 A M Medical Record Number: [id number] Patient Account Number: [id number] Date of Birth/Sex: Treating RN: 1946/06/08 (76 y.o. Jerilynn Mages) [name] Primary Care Laneice Meneely: MIRA NDA, HA RV EY Other Clinician: Referring  Samyiah Halvorsen: Treating Latrisa Hellums/Extender: Kalman Shan PA V LIS, BERNA DETTE Weeks in Treatment: 0 Wound Status Wound Number: 07/26/2021 Primary Etiology: Trauma, Other Wound Location: Gluteal fold Wound Status: Open Wounding Event: Trauma Comorbid History: Hypertension, Seizure Disorder Date Acquired: 07/26/2021 Weeks Of Treatment: 0 Clustered Wound: No Nathan [name], Nathan J ([id number]) [id number] Page 6 of 6 Photos Wound Measurements Length: (cm) 20 Width: (cm) 9 Depth: (cm) 0.2 Area: (cm) 141.372 Volume: (cm) 28.274 % Reduction in Area: % Reduction in Volume: Epithelialization: None Tunneling: No Undermining: No Wound Description Classification: Full Thickness Without Exposed Support Structures Exudate Amount: Medium Exudate Type: Serosanguineous Exudate Color: red, brown Foul Odor After Cleansing: No Slough/Fibrino Yes Wound Bed Granulation Amount: Small (1-33%) Exposed Structure Granulation Quality: Pink Fascia Exposed: No Necrotic Amount: Large (67-100%) Fat Layer (Subcutaneous Tissue) Exposed: Yes Necrotic Quality: Eschar, Adherent Slough Tendon  Exposed: No Muscle Exposed: No Joint Exposed: No Bone Exposed: No Electronic Signature(s) Signed: 07/28/2022 9:24:28 AM By: [name] RN Entered By: [name] on 07/28/2022 09:22:02 -------------------------------------------------------------------------------- Vitals Details Patient Name: Date of Service: Nathan [name], Nathan J. 07/28/2022 8:45 A M Medical Record Number: [id number] Patient Account Number: [id number] Date of Birth/Sex: Treating RN: [birth year] (76 y.o. M) [name] Primary Care Clydie Dillen: MIRA NDA, HA RV EY Other Clinician: Referring Kahlie Deutscher: Treating Dawana Asper/Extender: Kalman Shan PA V LIS, BERNA DETTE Weeks in Treatment: 0 Vital Signs Time Taken: 09:07 Temperature (F): 98.2 Height (in): 71 Pulse (bpm): 68 Source: Stated Respiratory Rate (breaths/min): 18 Weight  (lbs): 183 Blood Pressure (mmHg): 129/72 Source: Stated Reference Range: 80 - 120 mg / dl Body Mass Index (BMI): 25.5 Electronic Signature(s) Signed: 07/28/2022 9:24:28 AM By: [name] RN Entered By: [name] on 07/28/2022 09:07:38

## 2022-07-28 NOTE — Progress Notes (Signed)
NAEEM, QUILLIN ([id number]) [id number] Page 1 of 7 Visit Report for 07/28/2022 Chief Complaint Document Details Patient Name: Date of Service: Nathan [name], Nathan [name] 07/28/2022 8:45 A M Medical Record Number: [id number] Patient Account Number: [id number] Date of Birth/Sex: Treating RN: 04/09/1946 (76 y.o. Jerilynn Mages) [name] Primary Care Provider: MIRA NDA, HA RV EY Other Clinician: Referring Provider: Treating Provider/Extender: Kalman Shan PA V LIS, BERNA DETTE Weeks in Treatment: 0 Information Obtained from: Patient Chief Complaint 07/28/2022; buttocks wounds Electronic Signature(s) Signed: 07/28/2022 1:46:18 PM By: Kalman Shan DO Entered By: Kalman Shan on 07/28/2022 09:55:49 -------------------------------------------------------------------------------- HPI Details Patient Name: Date of Service: Nathan PEC, Nathan J. 07/28/2022 8:45 A M Medical Record Number: [id number] Patient Account Number: [id number] Date of Birth/Sex: Treating RN: 10-31-1945 (76 y.o. Jerilynn Mages) [name] Primary Care Provider: MIRA NDA, HA RV EY Other Clinician: Referring Provider: Treating Provider/Extender: Kalman Shan PA V LIS, BERNA DETTE Weeks in Treatment: 0 History of Present Illness HPI Description: 07/28/2022 Mr. Taylin Leder is a 77 year old male with a past medical history of PTSD, CVA, DVT on Eliquis that presents the clinic for a 1 year history of wounds to the buttocks. Wife is present and helps provide the history. Patient states that he fell a year ago and due to this he has been more immobile. He is able to ambulate with a walker however, he sits for long periods of time. He has currently been placing small Allevyn pads over the wound beds however this does not cover the region. He has been using Dakin's to clean the area. He currently denies signs of infection. Electronic Signature(s) Signed: 07/28/2022 1:46:18 PM By: Kalman Shan DO Entered By: Kalman Shan on  07/28/2022 09:59:48 Flitton, [name] ([id number]) [id number] Page 2 of 7 -------------------------------------------------------------------------------- Physical Exam Details Patient Name: Date of Service: Nathan PEC, Nathan [name] 07/28/2022 8:45 A M Medical Record Number: [id number] Patient Account Number: [id number] Date of Birth/Sex: Treating RN: 1946-02-13 (76 y.o. Jerilynn Mages) [name] Primary Care Provider: MIRA NDA, HA RV EY Other Clinician: Referring Provider: Treating Provider/Extender: Kalman Shan PA V LIS, BERNA DETTE Weeks in Treatment: 0 Constitutional . Psychiatric . Notes T the buttocks there are large open wounds with granulation tissue throughout. No signs of surrounding infection including increased warmth, erythema or o purulent drainage. Electronic Signature(s) Signed: 07/28/2022 1:46:18 PM By: Kalman Shan DO Entered By: Kalman Shan on 07/28/2022 10:00:24 -------------------------------------------------------------------------------- Physician Orders Details Patient Name: Date of Service: Nathan PEC, Elmore J. 07/28/2022 8:45 A M Medical Record Number: [id number] Patient Account Number: [id number] Date of Birth/Sex: Treating RN: 1946/02/20 (76 y.o. Jerilynn Mages) [name] Primary Care Provider: MIRA NDA, HA RV EY Other Clinician: Referring Provider: Treating Provider/Extender: Kalman Shan PA V LIS, BERNA DETTE Weeks in Treatment: 0 Verbal / Phone Orders: No Diagnosis Coding ICD-10 Coding Code Description L98.412 Non-pressure chronic ulcer of buttock with fat layer exposed F43.10 Post-traumatic stress disorder, unspecified Z86.718 Personal history of other venous thrombosis and embolism Z79.01 Long term (current) use of anticoagulants I10 Essential (primary) hypertension Bathing/ Shower/ Hygiene Wash wounds with antibacterial soap and water. Wound Treatment DAMON, HARGROVE ([id number]) [id number] Page 3 of  7 Wound #07/26/2021 - Gluteal fold Cleanser: Wound Cleanser Other:3 times per day and with each visit to the bathroom/30 Days Discharge Instructions: Wash your hands with soap and water. Remove old dressing, discard into plastic bag and place into trash. Cleanse the wound with Wound Cleanser prior to applying a clean dressing using gauze sponges, not tissues or cotton balls. Do not  scrub or use excessive force. Pat dry using gauze sponges, not tissue or cotton balls. Prim Dressing: AandD ointment ary Other:3 times per day and with each visit to the bathroom/30 Days Electronic Signature(s) Signed: 07/28/2022 1:46:18 PM By: Kalman Shan DO Entered By: Kalman Shan on 07/28/2022 10:09:38 -------------------------------------------------------------------------------- Problem List Details Patient Name: Date of Service: Nathan PEC, Pitkin. 07/28/2022 8:45 A M Medical Record Number: [id number] Patient Account Number: [id number] Date of Birth/Sex: Treating RN: March 28, 1946 (76 y.o. Jerilynn Mages) [name] Primary Care Provider: MIRA NDA, HA RV EY Other Clinician: Referring Provider: Treating Provider/Extender: Kalman Shan PA V LIS, BERNA DETTE Weeks in Treatment: 0 Active Problems ICD-10 Encounter Code Description Active Date MDM Diagnosis L98.412 Non-pressure chronic ulcer of buttock with fat layer exposed 07/28/2022 No Yes F43.10 Post-traumatic stress disorder, unspecified 07/28/2022 No Yes Z86.718 Personal history of other venous thrombosis and embolism 07/28/2022 No Yes Z79.01 Long term (current) use of anticoagulants 07/28/2022 No Yes I10 Essential (primary) hypertension 07/28/2022 No Yes Inactive Problems Resolved Problems Electronic Signature(s) Signed: 07/28/2022 1:46:18 PM By: Kalman Shan DO Entered By: Kalman Shan on 07/28/2022 09:55:18 Mikita, Carvell J ([id number]) [id number] Page 4 of  7 -------------------------------------------------------------------------------- Progress Note Details Patient Name: Date of Service: Nathan PEC, Nathan [name] 07/28/2022 8:45 A M Medical Record Number: [id number] Patient Account Number: [id number] Date of Birth/Sex: Treating RN: Dec 17, 1945 (76 y.o. Jerilynn Mages) [name] Primary Care Provider: MIRA NDA, HA RV EY Other Clinician: Referring Provider: Treating Provider/Extender: Kalman Shan PA V LIS, BERNA DETTE Weeks in Treatment: 0 Subjective Chief Complaint Information obtained from Patient 07/28/2022; buttocks wounds History of Present Illness (HPI) 07/28/2022 Mr. Eva Vallee is a 77 year old male with a past medical history of PTSD, CVA, DVT on Eliquis that presents the clinic for a 1 year history of wounds to the buttocks. Wife is present and helps provide the history. Patient states that he fell a year ago and due to this he has been more immobile. He is able to ambulate with a walker however, he sits for long periods of time. He has currently been placing small Allevyn pads over the wound beds however this does not cover the region. He has been using Dakin's to clean the area. He currently denies signs of infection. Patient History Allergies terazosin Social History Current every day smoker, Marital Status - Married, Alcohol Use - Never, Drug Use - No History, Caffeine Use - Daily. Medical History Cardiovascular Patient has history of Hypertension Neurologic Patient has history of Seizure Disorder Review of Systems (ROS) Eyes Complains or has symptoms of Glasses / Contacts. Integumentary (Skin) Complains or has symptoms of Wounds. Objective Constitutional Vitals Time Taken: 9:07 AM, Height: 71 in, Source: Stated, Weight: 183 lbs, Source: Stated, BMI: 25.5, Temperature: 98.2 F, Pulse: 68 bpm, Respiratory Rate: 18 breaths/min, Blood Pressure: 129/72 mmHg. General Notes: T the buttocks there are large open wounds with granulation  tissue throughout. No signs of surrounding infection including increased warmth, o erythema or purulent drainage. Integumentary (Hair, Skin) Wound #07/26/2021 status is Open. Original cause of wound was Trauma. The date acquired was: 07/26/2021. The wound is located on the Gluteal fold. The wound measures 20cm length x 9cm width x 0.2cm depth; 141.372cm^2 area and 28.274cm^3 volume. There is Fat Layer (Subcutaneous Tissue) exposed. There is no tunneling or undermining noted. There is a medium amount of serosanguineous drainage noted. There is small (1-33%) pink granulation within the wound bed. There is a large (67-100%) amount of necrotic tissue within the wound bed including Eschar  and Adherent Slough. JASPREET, HOLLINGS ([id number]) [id number] Page 5 of 7 Assessment Active Problems ICD-10 Non-pressure chronic ulcer of buttock with fat layer exposed Post-traumatic stress disorder, unspecified Personal history of other venous thrombosis and embolism Long term (current) use of anticoagulants Essential (primary) hypertension Patient presents with a 1 year history of open wounds to his buttocks secondary to moisture retention and friction. We had a long discussion about the importance of aggressive offloading and moisture control for wound healing. I recommended he aggressively offload the area by repositioning every 1-2 hours. I recommended he use a wound cleanser such as Vashe to cleanse the wound prior to dressing changes. He can stop Dakin's soaks for now. For the dressing I recommended AandD ointment covered by an ABD pad. Follow-up in 1 week. Plan Bathing/ Shower/ Hygiene: Wash wounds with antibacterial soap and water. WOUND #07/26/2021: - Gluteal fold Wound Laterality: Cleanser: Wound Cleanser Other:3 times per day and with each visit to the bathroom/30 Days Discharge Instructions: Wash your hands with soap and water. Remove old dressing, discard into plastic bag and  place into trash. Cleanse the wound with Wound Cleanser prior to applying a clean dressing using gauze sponges, not tissues or cotton balls. Do not scrub or use excessive force. Pat dry using gauze sponges, not tissue or cotton balls. Prim Dressing: AandD ointment Other:3 times per day and with each visit to the bathroom/30 Days ary 1. AandD ointment 2. Aggressive offloading 3. Wound cleanserooVashe 4. Follow-up in 1 week Electronic Signature(s) Signed: 07/28/2022 1:46:18 PM By: Kalman Shan DO Entered By: Kalman Shan on 07/28/2022 10:08:54 -------------------------------------------------------------------------------- ROS/PFSH Details Patient Name: Date of Service: Nathan PEC, Keyandre J. 07/28/2022 8:45 A M Medical Record Number: [id number] Patient Account Number: [id number] Date of Birth/Sex: Treating RN: [birth year] (76 y.o. Jerilynn Mages) [name] Primary Care Provider: MIRA NDA, HA RV EY Other Clinician: Referring Provider: Treating Provider/Extender: Kalman Shan PA V LIS, BERNA DETTE Weeks in Treatment: 0 Eyes Complaints and Symptoms: Positive for: Glasses / Contacts Integumentary (Skin) Complaints and Symptoms: Positive for: Wounds Cardiovascular Medical HistoryGARION, WEMPE ([id number]) [id number] Page 6 of 7 Positive for: Hypertension Neurologic Medical History: Positive for: Seizure Disorder Immunizations Pneumococcal Vaccine: Received Pneumococcal Vaccination: No Implantable Devices None Family and Social History Current every day smoker; Marital Status - Married; Alcohol Use: Never; Drug Use: No History; Caffeine Use: Daily Electronic Signature(s) Signed: 07/28/2022 9:24:28 AM By: [name] RN Signed: 07/28/2022 1:46:18 PM By: Kalman Shan DO Entered By: [name] on 07/28/2022 09:10:36 -------------------------------------------------------------------------------- SuperBill Details Patient Name: Date of Service: Nathan  PEC, Nathan J. 07/28/2022 Medical Record Number: [id number] Patient Account Number: [id number] Date of Birth/Sex: Treating RN: [birth year] (76 y.o. Jerilynn Mages) [name] Primary Care Provider: MIRA NDA, HA RV EY Other Clinician: Referring Provider: Treating Provider/Extender: Kalman Shan PA V LIS, BERNA DETTE Weeks in Treatment: 0 Diagnosis Coding ICD-10 Codes Code Description [phone] Non-pressure chronic ulcer of buttock with fat layer exposed F43.10 Post-traumatic stress disorder, unspecified Z86.718 Personal history of other venous thrombosis and embolism Z79.01 Long term (current) use of anticoagulants I10 Essential (primary) hypertension Facility Procedures : CPT4 Code: [id number] Description: 37628 - WOUND CARE VISIT-LEV 3 EST PT Modifier: Quantity: 1 Physician Procedures Electronic Signature(s) Signed: 07/28/2022 1:46:18 PM By: Kalman Shan DO Entered By: Kalman Shan on 07/28/2022 10:09:12

## 2022-08-04 ENCOUNTER — Ambulatory Visit: Payer: Non-veteran care | Admitting: Internal Medicine
